# Patient Record
Sex: Male | Born: 1950
Health system: Southern US, Community
[De-identification: ages and names within clinical notes are randomized; demographics above are authoritative.]

## PROBLEM LIST (undated history)

## (undated) ENCOUNTER — Emergency Department (HOSPITAL_COMMUNITY): Admission: EM | Payer: No Typology Code available for payment source | Source: Home / Self Care

## (undated) DIAGNOSIS — C61 Malignant neoplasm of prostate: Secondary | ICD-10-CM

## (undated) DIAGNOSIS — I209 Angina pectoris, unspecified: Secondary | ICD-10-CM

## (undated) DIAGNOSIS — K219 Gastro-esophageal reflux disease without esophagitis: Secondary | ICD-10-CM

## (undated) DIAGNOSIS — E119 Type 2 diabetes mellitus without complications: Secondary | ICD-10-CM

## (undated) DIAGNOSIS — E7800 Pure hypercholesterolemia, unspecified: Secondary | ICD-10-CM

## (undated) DIAGNOSIS — I10 Essential (primary) hypertension: Secondary | ICD-10-CM

## (undated) HISTORY — DX: Type 2 diabetes mellitus without complications: E11.9

## (undated) HISTORY — PX: CARDIAC CATHETERIZATION: SHX172

## (undated) HISTORY — PX: PROSTATE BIOPSY: SHX241

---

## 1999-07-14 ENCOUNTER — Encounter: Payer: Self-pay | Admitting: Emergency Medicine

## 1999-07-14 ENCOUNTER — Emergency Department (HOSPITAL_COMMUNITY): Admission: EM | Admit: 1999-07-14 | Discharge: 1999-07-14 | Payer: Self-pay | Admitting: Emergency Medicine

## 2001-02-23 ENCOUNTER — Inpatient Hospital Stay (HOSPITAL_COMMUNITY): Admission: EM | Admit: 2001-02-23 | Discharge: 2001-02-24 | Payer: Self-pay | Admitting: Emergency Medicine

## 2001-02-23 ENCOUNTER — Encounter: Payer: Self-pay | Admitting: Emergency Medicine

## 2007-11-16 ENCOUNTER — Emergency Department (HOSPITAL_COMMUNITY): Admission: EM | Admit: 2007-11-16 | Discharge: 2007-11-16 | Payer: Self-pay | Admitting: Emergency Medicine

## 2007-11-16 ENCOUNTER — Encounter (INDEPENDENT_AMBULATORY_CARE_PROVIDER_SITE_OTHER): Payer: Self-pay | Admitting: Emergency Medicine

## 2007-11-16 ENCOUNTER — Ambulatory Visit: Payer: Self-pay | Admitting: Surgery

## 2010-11-18 ENCOUNTER — Emergency Department (HOSPITAL_COMMUNITY)
Admission: EM | Admit: 2010-11-18 | Discharge: 2010-11-18 | Payer: Self-pay | Source: Home / Self Care | Admitting: Emergency Medicine

## 2011-05-01 NOTE — Discharge Summary (Signed)
[facility]  Patient:    Theodore Wood, Theodore Wood                     MRN: [id number] Adm. Date:  [id number] Disc. Date: [id number] Attending:  Darlin Priestly Dictator:   Halford Decamp. Delanna Ahmadi, R.N., N.P.                           Discharge Summary  HISTORY OF PRESENT ILLNESS AND HOSPITAL COURSE:  Ms. Vanhorn is a 60 year old male patient with no prior cardiac history.  He came into the emergency room after developing chest pain.  He also had some slurred speech, and the side of his face felt numb.  EMS was called.  He was given nitroglycerin in the emergency room with relief. He was started on Norvasc and aspirin and admitted to rule out MI.  He apparently has a history of polysubstance abuse including alcohol, tobacco, and cocaine.  Last used the night prior to admission.  His enzymes came back negative x 3.  His chest pain was relieved with one nitroglycerin.  He was hypertensive on the new medications.  His blood pressure came down on February 24, 2001.  He was considered stable to be discharged home.  LABORATORY DATA:  WBC 7.3, platelets 218, hematocrit 46, hemoglobin 15.1. Potassium 3.9, sodium 139, chloride 102, BUN 17, creatinine 1.0.  CK-MBs were negative x 3.  Troponin negative x 3.  Total cholesterol was 132, HDL was 49, LDL was 163.  Drug screen was positive for cocaine.  DISCHARGE MEDICATIONS: 1. Enteric-coated aspirin 81 mg once per day. 2. Atenolol 25 mg once per day. 3. Protonix 40 mg once per day x 1 month. 4. Norvasc 5 mg once per day. 5. Vitamin B complex 100 once per day.  ACTIVITY:  As tolerated.  DIET:  He should be on a low salt, low fat diet.  DISCHARGE INSTRUCTIONS:  He should quit smoking, alcohol use, and street drugs.  FOLLOW-UP:  He will follow up with a treadmill Cardiolite on March 25, 2001, at 1:30.  He will also have a 2-D echocardiogram on March 07, 2001, at 3 p.m. He will then follow up with Dr. Jenne Campus on April 01, 2001.  DISCHARGE DIAGNOSES: 1. Chest pain, possibly related to coronary spasm. 2. Hypertension. 3. Polysubstance abuse. DD:  04/20/01 TD:  04/21/01 Job: [id number]

## 2011-09-21 LAB — CBC
HCT: 45.4
Hemoglobin: 15.7
MCHC: 34.7
MCV: 90.8
Platelets: 223
RBC: 4.99
RDW: 14
WBC: 10.6 — ABNORMAL HIGH

## 2011-09-21 LAB — I-STAT 8, (EC8 V) (CONVERTED LAB)
BUN: 11
Chloride: 104
Glucose, Bld: 150 — ABNORMAL HIGH
pCO2, Ven: 30 — ABNORMAL LOW
pH, Ven: 7.511 — ABNORMAL HIGH

## 2012-02-03 ENCOUNTER — Inpatient Hospital Stay (HOSPITAL_COMMUNITY)
Admission: EM | Admit: 2012-02-03 | Discharge: 2012-02-05 | DRG: 092 | Disposition: A | Discharge status: Home / Self Care | Payer: Non-veteran care | Attending: Internal Medicine | Admitting: Internal Medicine

## 2012-02-03 ENCOUNTER — Other Ambulatory Visit: Payer: Self-pay

## 2012-02-03 ENCOUNTER — Encounter (HOSPITAL_COMMUNITY): Payer: Self-pay | Admitting: Emergency Medicine

## 2012-02-03 ENCOUNTER — Emergency Department (HOSPITAL_COMMUNITY): Payer: Non-veteran care

## 2012-02-03 DIAGNOSIS — G722 Myopathy due to other toxic agents: Principal | ICD-10-CM | POA: Diagnosis present

## 2012-02-03 DIAGNOSIS — F1010 Alcohol abuse, uncomplicated: Secondary | ICD-10-CM | POA: Diagnosis present

## 2012-02-03 DIAGNOSIS — Z87891 Personal history of nicotine dependence: Secondary | ICD-10-CM

## 2012-02-03 DIAGNOSIS — F1410 Cocaine abuse, uncomplicated: Secondary | ICD-10-CM | POA: Diagnosis present

## 2012-02-03 DIAGNOSIS — G720 Drug-induced myopathy: Secondary | ICD-10-CM | POA: Diagnosis present

## 2012-02-03 DIAGNOSIS — G479 Sleep disorder, unspecified: Secondary | ICD-10-CM | POA: Diagnosis present

## 2012-02-03 DIAGNOSIS — Z7982 Long term (current) use of aspirin: Secondary | ICD-10-CM

## 2012-02-03 DIAGNOSIS — M7989 Other specified soft tissue disorders: Secondary | ICD-10-CM | POA: Diagnosis present

## 2012-02-03 DIAGNOSIS — M6282 Rhabdomyolysis: Secondary | ICD-10-CM | POA: Diagnosis present

## 2012-02-03 DIAGNOSIS — I10 Essential (primary) hypertension: Secondary | ICD-10-CM | POA: Diagnosis not present

## 2012-02-03 DIAGNOSIS — R079 Chest pain, unspecified: Secondary | ICD-10-CM | POA: Diagnosis present

## 2012-02-03 DIAGNOSIS — T466X5A Adverse effect of antihyperlipidemic and antiarteriosclerotic drugs, initial encounter: Secondary | ICD-10-CM | POA: Diagnosis present

## 2012-02-03 DIAGNOSIS — IMO0001 Reserved for inherently not codable concepts without codable children: Secondary | ICD-10-CM | POA: Diagnosis present

## 2012-02-03 HISTORY — DX: Angina pectoris, unspecified: I20.9

## 2012-02-03 HISTORY — DX: Pure hypercholesterolemia, unspecified: E78.00

## 2012-02-03 HISTORY — DX: Gastro-esophageal reflux disease without esophagitis: K21.9

## 2012-02-03 HISTORY — DX: Essential (primary) hypertension: I10

## 2012-02-03 LAB — DIFFERENTIAL
Eosinophils Relative: 1 % (ref 0–5)
Lymphocytes Relative: 36 % (ref 12–46)
Lymphs Abs: 2.5 10*3/uL (ref 0.7–4.0)
Monocytes Absolute: 1.4 10*3/uL — ABNORMAL HIGH (ref 0.1–1.0)
Neutro Abs: 2.8 10*3/uL (ref 1.7–7.7)

## 2012-02-03 LAB — CBC
HCT: 47.5 % (ref 39.0–52.0)
HCT: 44.2 % (ref 39.0–52.0)
Hemoglobin: 15.7 g/dL (ref 13.0–17.0)
MCH: 31.6 pg (ref 26.0–34.0)
MCV: 89.3 fL (ref 78.0–100.0)
MCV: 88.9 fL (ref 78.0–100.0)
RBC: 5.32 MIL/uL (ref 4.22–5.81)
RBC: 4.97 MIL/uL (ref 4.22–5.81)
WBC: 6.8 10*3/uL (ref 4.0–10.5)

## 2012-02-03 LAB — URINALYSIS, ROUTINE W REFLEX MICROSCOPIC
Ketones, ur: NEGATIVE mg/dL
Leukocytes, UA: NEGATIVE
Nitrite: NEGATIVE
Specific Gravity, Urine: 1.025 (ref 1.005–1.030)
Urobilinogen, UA: 0.2 mg/dL (ref 0.0–1.0)
pH: 6 (ref 5.0–8.0)

## 2012-02-03 LAB — POCT I-STAT, CHEM 8
BUN: 11 mg/dL (ref 6–23)
Calcium, Ion: 1.22 mmol/L (ref 1.12–1.32)
Chloride: 99 mEq/L (ref 96–112)
HCT: 51 % (ref 39.0–52.0)
Potassium: 4.1 mEq/L (ref 3.5–5.1)

## 2012-02-03 LAB — CARDIAC PANEL(CRET KIN+CKTOT+MB+TROPI)
CK, MB: 5.9 ng/mL — ABNORMAL HIGH (ref 0.3–4.0)
Relative Index: 0.6 (ref 0.0–2.5)
Total CK: 1198 U/L — ABNORMAL HIGH (ref 7–232)
Troponin I: <0.3 ng/mL (ref <0.30)

## 2012-02-03 LAB — RAPID URINE DRUG SCREEN, HOSP PERFORMED: Benzodiazepines: NOT DETECTED

## 2012-02-03 LAB — VITAMIN B12: Vitamin B-12: 737 pg/mL (ref 211–911)

## 2012-02-03 LAB — HEPATIC FUNCTION PANEL
Albumin: 3.8 g/dL (ref 3.5–5.2)
Alkaline Phosphatase: 94 U/L (ref 39–117)
Total Protein: 7.4 g/dL (ref 6.0–8.3)

## 2012-02-03 LAB — PHOSPHORUS: Phosphorus: 3.3 mg/dL (ref 2.3–4.6)

## 2012-02-03 LAB — CALCIUM: Calcium: 9.7 mg/dL (ref 8.4–10.5)

## 2012-02-03 LAB — TSH: TSH: 3.128 u[IU]/mL (ref 0.350–4.500)

## 2012-02-03 MED ORDER — FOLIC ACID 1 MG PO TABS
1.0000 mg | ORAL_TABLET | Freq: Two times a day (BID) | ORAL | Status: DC
Start: 2012-02-03 — End: 2012-02-05
  Administered 2012-02-03 – 2012-02-05 (×4): 1 mg via ORAL
  Filled 2012-02-03 (×5): qty 1

## 2012-02-03 MED ORDER — THIAMINE HCL 100 MG PO TABS: 100.0000 mg | ORAL_TABLET | Freq: Every day | ORAL | Status: DC

## 2012-02-03 MED ORDER — LORAZEPAM 2 MG/ML IJ SOLN: 0.0000 mg | Freq: Four times a day (QID) | INTRAMUSCULAR | Status: AC

## 2012-02-03 MED ORDER — SODIUM CHLORIDE 0.9 % IV SOLN
INTRAVENOUS | Status: DC
  Administered 2012-02-03: 08:00:00 via INTRAVENOUS

## 2012-02-03 MED ORDER — INFLUENZA VIRUS VACC SPLIT PF IM SUSP
0.5000 mL | INTRAMUSCULAR | Status: AC
  Administered 2012-02-04: 0.5 mL via INTRAMUSCULAR
  Filled 2012-02-03: qty 0.5

## 2012-02-03 MED ORDER — DOCUSATE SODIUM 100 MG PO CAPS
100.0000 mg | ORAL_CAPSULE | Freq: Two times a day (BID) | ORAL | Status: DC | PRN
  Filled 2012-02-03: qty 1

## 2012-02-03 MED ORDER — ASPIRIN EC 325 MG PO TBEC
325.0000 mg | DELAYED_RELEASE_TABLET | Freq: Every day | ORAL | Status: DC
  Administered 2012-02-03 – 2012-02-05 (×3): 325 mg via ORAL
  Filled 2012-02-03 (×3): qty 1

## 2012-02-03 MED ORDER — LORAZEPAM 2 MG/ML IJ SOLN: 1.0000 mg | Freq: Four times a day (QID) | INTRAMUSCULAR | Status: DC | PRN

## 2012-02-03 MED ORDER — FLUNISOLIDE 25 MCG/ACT (0.025%) NA SOLN
2.0000 | Freq: Two times a day (BID) | NASAL | Status: DC
  Filled 2012-02-03: qty 25

## 2012-02-03 MED ORDER — MORPHINE SULFATE 2 MG/ML IJ SOLN
0.5000 mg | INTRAMUSCULAR | Status: DC | PRN
  Administered 2012-02-05: 0.5 mg via INTRAVENOUS
  Filled 2012-02-03: qty 1

## 2012-02-03 MED ORDER — PANTOPRAZOLE SODIUM 40 MG PO TBEC
40.0000 mg | DELAYED_RELEASE_TABLET | Freq: Every day | ORAL | Status: DC
  Administered 2012-02-03 – 2012-02-05 (×3): 40 mg via ORAL
  Filled 2012-02-03 (×3): qty 1

## 2012-02-03 MED ORDER — SODIUM CHLORIDE 0.9 % IJ SOLN
3.0000 mL | Freq: Two times a day (BID) | INTRAMUSCULAR | Status: DC
  Administered 2012-02-03 – 2012-02-04 (×3): 3 mL via INTRAVENOUS

## 2012-02-03 MED ORDER — SODIUM CHLORIDE 0.9 % IV SOLN
INTRAVENOUS | Status: DC
  Administered 2012-02-03 – 2012-02-05 (×4): via INTRAVENOUS

## 2012-02-03 MED ORDER — ADULT MULTIVITAMIN W/MINERALS CH
1.0000 | ORAL_TABLET | Freq: Every day | ORAL | Status: DC
  Administered 2012-02-03 – 2012-02-05 (×3): 1 via ORAL
  Filled 2012-02-03 (×3): qty 1

## 2012-02-03 MED ORDER — NITROGLYCERIN 0.4 MG SL SUBL: 0.4000 mg | SUBLINGUAL_TABLET | SUBLINGUAL | Status: DC | PRN

## 2012-02-03 MED ORDER — LORAZEPAM 1 MG PO TABS: 1.0000 mg | ORAL_TABLET | Freq: Four times a day (QID) | ORAL | Status: DC | PRN

## 2012-02-03 MED ORDER — FLUTICASONE PROPIONATE 50 MCG/ACT NA SUSP
2.0000 | Freq: Every day | NASAL | Status: DC
  Administered 2012-02-04 – 2012-02-05 (×2): 2 via NASAL
  Filled 2012-02-03 (×2): qty 16

## 2012-02-03 MED ORDER — OMEGA-3 FATTY ACIDS 1000 MG PO CAPS: 1.0000 g | ORAL_CAPSULE | Freq: Two times a day (BID) | ORAL | Status: DC

## 2012-02-03 MED ORDER — HYDROCHLOROTHIAZIDE 25 MG PO TABS
25.0000 mg | ORAL_TABLET | Freq: Every day | ORAL | Status: DC
  Administered 2012-02-04 – 2012-02-05 (×2): 25 mg via ORAL
  Filled 2012-02-03 (×3): qty 1

## 2012-02-03 MED ORDER — LORAZEPAM 2 MG/ML IJ SOLN: 0.0000 mg | Freq: Two times a day (BID) | INTRAMUSCULAR | Status: DC

## 2012-02-03 MED ORDER — ZOLPIDEM TARTRATE 5 MG PO TABS
5.0000 mg | ORAL_TABLET | Freq: Once | ORAL | Status: AC
  Administered 2012-02-03: 5 mg via ORAL
  Filled 2012-02-03: qty 1

## 2012-02-03 MED ORDER — VITAMIN B-1 100 MG PO TABS
100.0000 mg | ORAL_TABLET | Freq: Every day | ORAL | Status: DC
  Administered 2012-02-03 – 2012-02-05 (×3): 100 mg via ORAL
  Filled 2012-02-03 (×3): qty 1

## 2012-02-03 MED ORDER — HEPARIN SODIUM (PORCINE) 5000 UNIT/ML IJ SOLN
5000.0000 [IU] | Freq: Three times a day (TID) | INTRAMUSCULAR | Status: DC
  Administered 2012-02-03 – 2012-02-05 (×5): 5000 [IU] via SUBCUTANEOUS
  Filled 2012-02-03 (×9): qty 1

## 2012-02-03 NOTE — ED Notes (Signed)
Returned from [employer]. Vss. Pt placed back on cardiac monitor.

## 2012-02-03 NOTE — H&P (Signed)
Internal Medicine Teaching Service  PCP: [facility] hospital (Dr.  Benard Halsted) Attending: Dr. Josem Kaufmann  First Contact: Dr. Bosie Clos [phone] Second Contact: Dr. Tonny Branch PGY 2 # [phone]  Subjective:   Patient ID: Theodore Wood male   DOB: 07-24-1951 61 y.o.   MRN: [id number]  CHIEF COMPLAINT: "body aches all over for 1 week."  HISTORY OF PRESENT ILLNESS: Mr.Theodore Wood is a 61 y.o.  AA man and [facility];  with a history of HTN, HLD and ETOH and cocaine use, presents to [facility] ED with left sided CP and "body aches all over" for at least one week duration. Patient states that the CP is left sided, sharp 8/10 in intensity, intermittent, radiates up to his right neck, and started while being asleep in bed. He reports mild nausea, SOB and mild sweats but no orthopnea, PND, vomiting, fever, chills, cough, pleurisy, HA, black outs, blurry vision, sore throat, abdominal pain, black/blody stools, hematuria, anuria, oliguria or dysuria. Patient denies any recent travel, trauma. Last  Alcohol drink yesterday on 02/02/12; last crack cocaine use 01/31/12.  Patient states that the CP and body aches are worse with movement and not relieved with "BC's or aspirin" (last does 02/01/12). States that has had similar CP 7 years ago for which he had a full work up including cath angio in [facility] "and was normal." However, I was unable to locate the records. Off note, the patient had a recent OV with his PCP at [facility] hospital on 01/28/12 for the same symptoms. At that time, he was prescribed new medications, including atorvastatin, omeprazole, thiamine and folate.    ER Tx given - NS IVF at 125 cc/hr   Past Medical History  Diagnosis Date  . Hypertension   . Chest pain    Current Facility-Administered Medications  Medication Dose Route Frequency Provider Last Rate Last Dose  . 0.9 %  sodium chloride infusion   Intravenous Continuous Juliet Rude. Rubin Payor, MD 125 mL/hr at 02/03/12 0800     Current Outpatient  Prescriptions  Medication Sig Dispense Refill  . atorvastatin (LIPITOR) 40 MG tablet Take 20 mg by mouth at bedtime.      . fish oil-omega-3 fatty acids 1000 MG capsule Take 1 g by mouth 2 (two) times daily.      . flunisolide (NASALIDE) 0.025 % SOLN Inhale 2 sprays into the lungs 2 (two) times daily.      . folic acid (FOLVITE) 1 MG tablet Take 1 mg by mouth 2 (two) times daily.      . hydrochlorothiazide (HYDRODIURIL) 25 MG tablet Take 25 mg by mouth daily.      Marland Kitchen omeprazole (PRILOSEC) 20 MG capsule Take 20 mg by mouth daily.      Marland Kitchen thiamine 100 MG tablet Take 200 mg by mouth 2 (two) times daily.       History reviewed. No pertinent family history. History   Social History  . Marital Status: Widowed    Spouse Name: N/A    Number of Children: 4 children live in [place] and [place]  . Years of Education: High school   Social History Main Topics  . Smoking status: Former Smoker    Quit date: 02/02/2002  . Smokeless tobacco: None  . Alcohol Use: Yes     6pack beer/day  . Drug Use: Yes    Special: Cocaine  . Sexually Active: Yes   Other Topics Concern  . None   Social History Narrative  . None  Review of Systems: Constitutional: Denies fever, chills, diaphoresis, appetite change and fatigue.  HEENT: Denies photophobia, eye pain, redness, hearing loss, ear pain, congestion, sore throat, rhinorrhea, sneezing, mouth sores, trouble swallowing, neck pain, neck stiffness and tinnitus.   Respiratory: endorses mild SOB. Denies DOE, cough, chest tightness, and wheezing.   Cardiovascular: Denies chest pain, palpitations ; endorses "occasional" leg swelling.  Gastrointestinal: Endorses nausea, denis vomiting, abdominal pain, diarrhea, constipation, blood in stool and abdominal distention.  Genitourinary: Denies dysuria, urgency, frequency, hematuria, flank pain and difficulty urinating.  Musculoskeletal: Reports myalgias and lower back pain,  Right hand 3rd digit  ip joint joint  swellingand pain; denies gait problem.  Skin: Denies pallor, rash and wound.  Neurological: reports slight dizziness, denies seizures, syncope, weakness, light-headedness, numbness and headaches.  Hematological: Denies adenopathy. Easy bruising, personal or family bleeding history  Psychiatric/Behavioral: Denies suicidal ideation, mood changes, confusion, nervousness, sleep disturbance and agitation  Objective:  Physical Exam: Filed Vitals:   02/03/12 0721  BP: 152/100  Pulse: 83  Temp: 98.4 F (36.9 C)  TempSrc: Oral  Resp: 22  SpO2: 99%   Constitutional: Vital signs reviewed.  Patient is a well-developed and well-nourished, in no acute distress and cooperative with exam. Alert and oriented x3.  Head: Normocephalic and atraumatic Ear: TM normal bilaterally Mouth: no erythema or exudates, dry oral mucosa; poor dentition Eyes: Arcus senilis bilaterally,  PERRL, EOMI, conjunctivae normal, No scleral icterus.  Neck: Supple, Trachea midline normal ROM, No JVD, mass, thyromegaly, or carotid bruit present.  Cardiovascular: RRR, S1 normal, S2 normal, no MRG, pulses symmetric and intact bilaterally Pulmonary/Chest: chest wall TTP bilaterally; CTAB, no wheezes, rales, or rhonchi Abdominal: Soft. Non-tender, non-distended, bowel sounds are normal, no masses, organomegaly, or guarding present.  GU: no CVA tenderness Musculoskeletal: No joint deformities, erythema, or stiffness, ROM full; right hand 3rd digit proximal phalangeus TTP noted Hematology: no cervical, inginal, or axillary adenopathy.  Neurological: A&O x3, Strenght is normal and symmetric bilaterally, cranial nerve II-XII are grossly intact, no focal motor deficit, sensory intact to light touch bilaterally. No asterixis. Skin: Warm, dry and intact. No rash, cyanosis, or clubbing. No track marks. Psychiatric: Normal mood and affect. speech and behavior is normal. Judgment and thought content normal. Cognition and memory are normal.    Basic Metabolic Panel:  Basename 02/03/12 0837  NA 139  K 4.1  CL 99  CO2 --  GLUCOSE 102*  BUN 11  CREATININE 0.90  CALCIUM --  MG --  PHOS --   Liver Function Tests: pending  CBC:  Basename 02/03/12 0837 02/03/12 0745  WBC -- 6.8  NEUTROABS -- 2.8  HGB 17.3* 16.9  HCT 51.0 47.5  MCV -- 89.3  PLT -- 237   Cardiac Enzymes:  Basename 02/03/12 0745  CKTOTAL 1198*  CKMB 7.6*  CKMBINDEX --  TROPONINI <0.30   Urine Drug Screen: Drugs of Abuse     Component Value Date/Time   LABOPIA NONE DETECTED 02/03/2012 0752   COCAINSCRNUR POSITIVE* 02/03/2012 0752   LABBENZ NONE DETECTED 02/03/2012 0752   AMPHETMU NONE DETECTED 02/03/2012 0752   THCU NONE DETECTED 02/03/2012 0752   LABBARB NONE DETECTED 02/03/2012 0752    Alcohol Level: No results found for this basename: ETH:2 in the last 72 hours Urinalysis:  Basename 02/03/12 0752  COLORURINE YELLOW  LABSPEC 1.025  PHURINE 6.0  GLUCOSEU NEGATIVE  HGBUR NEGATIVE  BILIRUBINUR NEGATIVE  KETONESUR NEGATIVE  PROTEINUR NEGATIVE  UROBILINOGEN 0.2  NITRITE NEGATIVE  LEUKOCYTESUR NEGATIVE  Misc. Labs: Amylase: pending TSH: pending HGB A1C: pending EKG: reviewed   Assessment & Plan:   ASSESSMENT: [name] is a 61 y/o AA man with multiple risk factors for CAD including age, gender, HTN, HLD, cocaine use and FMH of DM and CAD.  1. He presents with left-sided CP and myalgias.  Differential list of Dx includes but not limited to: *CAD -TIMI score  3 (risk of 13%) vs *Muskuloskeletal CP - myofascial strain/rhabdomylisis vs costochondritis vs *GERD vs **PE is ujlikely-->Modified [place] score:3 which indicates low probablity (8%)  - telemetry x 48 hrs - cardiac enzymes x 2 more q 6 hr - repeat EKG in AM - ASA - No beta blockers due to Cocaine + UDS - No statin due to rhabdomyolysis - O2 by [place] to keep SpO2 greater than 92th % - CBC, BMP in AM - Fasting lipids, HgbA1C, TSH, Amylase - NTG SL PRN - Morphine 0.5  mg IV q 2-4 hr PRN chest pain with hold parameters - Tylenol PRN if LFT's normal - continue with PPI for GERD - 2D Echo  2. Myalgias  In a setting elevated CK. Likely due ot Rhabdomyolysis. Patient is a poor historian and it is difficult to time the onset of his symptoms and it is unclear whether the patient was already symptomatic prior to the start of a therapy with Atorvastatin as of 01/28/12. Patient denies any crush injuries or prolonged immobility, strenuous exercise or seizure activity. A rheumatology work up  May be considered but  polymyalgia rheumatica and/or polymyositis seem to be unlikely given the patient's mono arthralgia of a small joint. In  Addition, patient does not seem to be volume contracted ->no ortho stasis, elevated BP's; and does not have fevers->low index of suspicion for a viral infection.  -d/C statin -NS IVF at 125 cc/hr -I/O's -monitor renal function with daily B-mets -check TSH,  Mg, Phos levels.  3. HTN -continue with HCTZ home dose -no beta blockers per above -Heart Healthy diet  4. ETOH abuse -patient admits to being CAGE positive but no hx of DT's. -Seizure precatuions, fall precautions -CIWA protocol with scheduled Ativan -Folate,Thiamine and MVI daily -check vit B12, Thiamine levels -counseling  5. PPx: - heparin 5000 Units SQ  q8 hr  6. Medical records from [facility] hospital requested.

## 2012-02-03 NOTE — ED Notes (Addendum)
Pt c/o of sharp left sided chest pain for the last week. Pt reports pain is worse at night and he cant sleep. Pt denies nausea/vomiting with pain. Reports SOB with pain. Pt reports last cocaine use 3 days ago.

## 2012-02-03 NOTE — ED Notes (Signed)
Dennie Bible, RN covering for [employer], Charity fundraiser for lunch break.

## 2012-02-03 NOTE — ED Provider Notes (Signed)
History     CSN: [id number]  Arrival date & time 02/03/12  0717   First MD Initiated Contact with Patient 02/03/12 [phone]      Chief Complaint  Patient presents with  . Chest Pain    (Consider location/radiation/quality/duration/timing/severity/associated sxs/prior treatment) Patient is a 61 y.o. male presenting with chest pain. The history is provided by the patient.  Chest Pain Primary symptoms include shortness of breath. Pertinent negatives for primary symptoms include no abdominal pain, no nausea and no vomiting.  Pertinent negatives for associated symptoms include no numbness and no weakness.    patient states she's had a week of left-sided chest pain. Comes and goes. It is worse at night. No nausea vomiting. He's had some shortness of breath with the pain. He states he also gets muscle cramps over various parts of his body. No fevers. The pain does not come on with exertion. He last used cocaine 3 days ago. He states he is a former smoker. He states he's been eating and drinking okay. No coughing. He cannot make the pain go away.  Past Medical History  Diagnosis Date  . Hypertension     No past surgical history on file.  No family history on file.  History  Substance Use Topics  . Smoking status: Former Games developer  . Smokeless tobacco: Not on file  . Alcohol Use: Yes     6pack beer/day      Review of Systems  Constitutional: Negative for activity change and appetite change.  HENT: Negative for neck stiffness.   Eyes: Negative for pain.  Respiratory: Positive for shortness of breath. Negative for chest tightness.   Cardiovascular: Positive for chest pain. Negative for leg swelling.  Gastrointestinal: Negative for nausea, vomiting, abdominal pain and diarrhea.  Genitourinary: Negative for flank pain.  Musculoskeletal: Negative for back pain.       Muscle cramps  Skin: Negative for rash.  Neurological: Negative for weakness, numbness and headaches.    Psychiatric/Behavioral: Negative for behavioral problems.    Allergies  Review of patient's allergies indicates no known allergies.  Home Medications   Current Outpatient Rx  Name Route Sig Dispense Refill  . ATORVASTATIN CALCIUM 40 MG PO TABS Oral Take 20 mg by mouth at bedtime.    . OMEGA-3 FATTY ACIDS 1000 MG PO CAPS Oral Take 1 g by mouth 2 (two) times daily.    Marland Kitchen FLUNISOLIDE 0.025 % NA SOLN Inhalation Inhale 2 sprays into the lungs 2 (two) times daily.    Marland Kitchen FOLIC ACID 1 MG PO TABS Oral Take 1 mg by mouth 2 (two) times daily.    Marland Kitchen HYDROCHLOROTHIAZIDE 25 MG PO TABS Oral Take 25 mg by mouth daily.    Marland Kitchen OMEPRAZOLE 20 MG PO CPDR Oral Take 20 mg by mouth daily.    . THIAMINE HCL 100 MG PO TABS Oral Take 200 mg by mouth 2 (two) times daily.      BP 152/100  Pulse 83  Temp(Src) 98.4 F (36.9 C) (Oral)  Resp 22  SpO2 99%  Physical Exam  Nursing note and vitals reviewed. Constitutional: He is oriented to person, place, and time. He appears well-developed and well-nourished.  HENT:  Head: Normocephalic and atraumatic.  Eyes: EOM are normal. Pupils are equal, round, and reactive to light.  Neck: Normal range of motion. Neck supple.  Cardiovascular: Normal rate, regular rhythm and normal heart sounds.   No murmur heard. Pulmonary/Chest: Effort normal and breath sounds normal.  Abdominal: Soft. Bowel  sounds are normal. He exhibits no distension and no mass. There is no tenderness. There is no rebound and no guarding.  Musculoskeletal: Normal range of motion. He exhibits no edema.  Neurological: He is alert and oriented to person, place, and time. No cranial nerve deficit.  Skin: Skin is warm and dry.  Psychiatric: He has a normal mood and affect.    ED Course  Procedures (including critical care time)  Labs Reviewed  DIFFERENTIAL - Abnormal; Notable for the following:    Neutrophils Relative 41 (*)    Monocytes Relative 21 (*)    Monocytes Absolute 1.4 (*)    All other  components within normal limits  CARDIAC PANEL(CRET KIN+CKTOT+MB+TROPI) - Abnormal; Notable for the following:    Total CK 1198 (*)    CK, MB 7.6 (*)    All other components within normal limits  URINE RAPID DRUG SCREEN (HOSP PERFORMED) - Abnormal; Notable for the following:    Cocaine POSITIVE (*)    All other components within normal limits  POCT I-STAT, CHEM 8 - Abnormal; Notable for the following:    Glucose, Bld 102 (*)    Hemoglobin 17.3 (*)    All other components within normal limits  CBC  URINALYSIS, ROUTINE W REFLEX MICROSCOPIC  DRUG SCREEN PANEL (SERUM)  DRUG SCREEN PANEL (SERUM)   Dg Chest 2 View  02/03/2012  *RADIOLOGY REPORT*  Clinical Data: 61 year old male with chest pain, chills, shortness of breath.  CHEST - 2 VIEW  Comparison: None.  Findings: Lung volumes are within normal limits. Normal cardiac size and mediastinal contours.  Visualized tracheal air column is within normal limits.  No pneumothorax, pulmonary edema, pleural effusion or confluent pulmonary opacity. No acute osseous abnormality identified.  IMPRESSION: No acute cardiopulmonary abnormality.  Original Report Authenticated By: Ulla Potash III, M.D.     1. Chest pain   2. Rhabdomyolysis   3. Cocaine abuse      Date: 02/03/2012  Rate: 85  Rhythm: normal sinus rhythm  QRS Axis: normal  Intervals: normal  ST/T Wave abnormalities: nonspecific ST/T changes  Conduction Disutrbances:none  Narrative Interpretation:   Old EKG Reviewed: changes noted    MDM  Chest pain and muscle aches. She's cocaine 3 days ago. Patient has elevated total CK but negative index. EKG has nonspecific changes. Patient states that he had a heart catheterization back on several years ago, not able to find her evidence of this. He'll be admitted to outpatient clinic for further workup        Juliet Rude. Rubin Payor, MD 02/03/12 [phone]

## 2012-02-03 NOTE — ED Notes (Signed)
Internal Medicine MD at bedside

## 2012-02-04 ENCOUNTER — Other Ambulatory Visit: Payer: Self-pay

## 2012-02-04 DIAGNOSIS — G720 Drug-induced myopathy: Secondary | ICD-10-CM | POA: Diagnosis present

## 2012-02-04 DIAGNOSIS — M6282 Rhabdomyolysis: Secondary | ICD-10-CM

## 2012-02-04 DIAGNOSIS — T466X5A Adverse effect of antihyperlipidemic and antiarteriosclerotic drugs, initial encounter: Secondary | ICD-10-CM | POA: Diagnosis present

## 2012-02-04 LAB — BASIC METABOLIC PANEL
BUN: 11 mg/dL (ref 6–23)
CO2: 25 mEq/L (ref 19–32)
CO2: 25 mEq/L (ref 19–32)
Calcium: 9.2 mg/dL (ref 8.4–10.5)
Calcium: 9 mg/dL (ref 8.4–10.5)
Chloride: 104 mEq/L (ref 96–112)
Creatinine, Ser: 0.98 mg/dL (ref 0.50–1.35)
Creatinine, Ser: 0.93 mg/dL (ref 0.50–1.35)
GFR calc non Af Amer: >90 mL/min (ref >90)
Glucose, Bld: 104 mg/dL — ABNORMAL HIGH (ref 70–99)

## 2012-02-04 LAB — CARDIAC PANEL(CRET KIN+CKTOT+MB+TROPI)
CK, MB: 5 ng/mL — ABNORMAL HIGH (ref 0.3–4.0)
CK, MB: 4.6 ng/mL — ABNORMAL HIGH (ref 0.3–4.0)
Relative Index: 0.3 (ref 0.0–2.5)
Relative Index: 0.3 (ref 0.0–2.5)
Total CK: 1965 U/L — ABNORMAL HIGH (ref 7–232)
Total CK: 1802 U/L — ABNORMAL HIGH (ref 7–232)
Troponin I: <0.3 ng/mL (ref <0.30)
Troponin I: <0.3 ng/mL (ref <0.30)

## 2012-02-04 LAB — LIPID PANEL
Cholesterol: 159 mg/dL (ref 0–200)
Triglycerides: 289 mg/dL — ABNORMAL HIGH (ref <150)
VLDL: 58 mg/dL — ABNORMAL HIGH (ref 0–40)

## 2012-02-04 LAB — HEMOGLOBIN A1C: Hgb A1c MFr Bld: 5.6 % (ref <5.7)

## 2012-02-04 LAB — FOLATE RBC: RBC Folate: 950 ng/mL — ABNORMAL HIGH (ref >=366)

## 2012-02-04 LAB — CK: Total CK: 1696 U/L — ABNORMAL HIGH (ref 7–232)

## 2012-02-04 MED ORDER — TRAMADOL HCL 50 MG PO TABS
50.0000 mg | ORAL_TABLET | Freq: Four times a day (QID) | ORAL | Status: DC | PRN
  Administered 2012-02-04 – 2012-02-05 (×3): 50 mg via ORAL
  Filled 2012-02-04 (×3): qty 1

## 2012-02-04 MED ORDER — ZOLPIDEM TARTRATE 5 MG PO TABS
10.0000 mg | ORAL_TABLET | Freq: Every evening | ORAL | Status: DC | PRN
  Administered 2012-02-04: 10 mg via ORAL
  Filled 2012-02-04: qty 2

## 2012-02-04 NOTE — Progress Notes (Signed)
Subjective: Theodore Wood in sitting in bed comfortably. He says that his myalgias are "much better" since yesterday and he is urinating frequently. He did have some difficulty sleeping last night due to noise, but otherwise feels well rested. He says that his L sided CP has remitted and he denies any SOB or lightheadedness. He has a good appetite and was eating breakfast at bedside. He does complain of increased R hand swelling over the past day. He says that he first noticed it in his R 3rd knuckle but know his hand is diffusely swollen and tight and he has difficulty with wrist extension.    Objective: Vital signs in last 24 hours: Filed Vitals:   02/03/12 1930 02/03/12 2100 02/04/12 0500 02/04/12 0900  BP:  130/79 155/97 147/98  Pulse:  82 89 99  Temp:  98.4 F (36.9 C) 98.2 F (36.8 C) 98.6 F (37 C)  TempSrc:  Oral Oral Oral  Resp:  20 18 20   Height: 5\' 6"  (1.676 m)     Weight: 75.751 kg (167 lb)     SpO2:  95% 93% 99%   Weight change:   Intake/Output Summary (Last 24 hours) at 02/04/12 1124 Last data filed at 02/04/12 0900  Gross per 24 hour  Intake 536.25 ml  Output    350 ml  Net 186.25 ml   Physical Exam: General: resting in bed HEENT: PERRL, EOMI, no scleral icterus Cardiac: RRR, no rubs, murmurs or gallops Pulm: clear to auscultation bilaterally, moving normal volumes of air Abd: soft, nontender, nondistended, BS present Ext: R hand is moderately swollen from wrist through all digits. Skin is taught and warm. Radial pulse is 2+, IV inserted in opposite forearm vein. Poor wrist extension and grip strength 2/2 pain. Hand and wrist diffusely tender to palpation.  Neuro: alert and oriented X3, cranial nerves II-XII grossly intact   Lab Results: Basic Metabolic Panel:  Lab 02/04/12 4854 02/03/12 1542 02/03/12 0837  NA 137 -- 139  K 3.8 -- 4.1  CL 104 -- 99  CO2 25 -- --  GLUCOSE 104* -- 102*  BUN 11 -- 11  CREATININE 0.98 0.89 --  CALCIUM 9.2 9.7 --  MG -- 2.0  --  PHOS -- 3.3 --   Liver Function Tests:  Lab 02/03/12 1542  AST 56*  ALT 44  ALKPHOS 94  BILITOT 0.4  PROT 7.4  ALBUMIN 3.8    Lab 02/03/12 1542  LIPASE --  AMYLASE 74   CBC:  Lab 02/03/12 1542 02/03/12 0837 02/03/12 0745  WBC 6.9 -- 6.8  NEUTROABS -- -- 2.8  HGB 15.7 17.3* --  HCT 44.2 51.0 --  MCV 88.9 -- 89.3  PLT 221 -- 237   Cardiac Enzymes:  Lab 02/04/12 0909 02/04/12 0655 02/03/12 2314 02/03/12 1535  CKTOTAL 1857* 1802* 1965* --  CKMB -- 4.6* 5.0* 5.9*  CKMBINDEX -- -- -- --  TROPONINI -- <0.30 <0.30 <0.30   Hemoglobin A1C:  Lab 02/03/12 1542  HGBA1C 5.6   Fasting Lipid Panel:  Lab 02/04/12 0655  CHOL 159  HDL 34*  LDLCALC 67  TRIG 627*  CHOLHDL 4.7  LDLDIRECT --   Thyroid Function Tests:  Lab 02/03/12 1542  TSH 3.128  T4TOTAL --  FREET4 --  T3FREE --  THYROIDAB --   Coagulation:  Lab 02/04/12 0655  LABPROT 13.4  INR 1.00   Anemia Panel:  Lab 02/03/12 1542  VITAMINB12 737  FOLATE --  FERRITIN --  TIBC --  IRON --  RETICCTPCT --   Urine Drug Screen: Drugs of Abuse     Component Value Date/Time   LABOPIA NONE DETECTED February 21, 2012 0752   COCAINSCRNUR POSITIVE* 2012/02/21 0752   LABBENZ NONE DETECTED 02/21/12 0752   AMPHETMU NONE DETECTED 02/21/12 0752   THCU NONE DETECTED Feb 21, 2012 0752   LABBARB NONE DETECTED February 21, 2012 0752   Urinalysis:  Lab February 21, 2012 0752  COLORURINE YELLOW  LABSPEC 1.025  PHURINE 6.0  GLUCOSEU NEGATIVE  HGBUR NEGATIVE  BILIRUBINUR NEGATIVE  KETONESUR NEGATIVE  PROTEINUR NEGATIVE  UROBILINOGEN 0.2  NITRITE NEGATIVE  LEUKOCYTESUR NEGATIVE    Studies/Results: Dg Chest 2 View Feb 21, 2012  IMPRESSION: No acute cardiopulmonary abnormality.    Medications: I have reviewed the patient's current medications. Scheduled Meds:   . aspirin EC  325 mg Oral Daily  . fluticasone  2 spray Each Nare Daily  . folic acid  1 mg Oral BID  . heparin  5,000 Units Subcutaneous Q8H  . hydrochlorothiazide   25 mg Oral Daily  . influenza  inactive virus vaccine  0.5 mL Intramuscular Tomorrow-1000  . LORazepam  0-4 mg Intravenous Q6H   Followed by  . LORazepam  0-4 mg Intravenous Q12H  . mulitivitamin with minerals  1 tablet Oral Daily  . pantoprazole  40 mg Oral Q1200  . sodium chloride  3 mL Intravenous Q12H  . thiamine  100 mg Oral Daily  . zolpidem  5 mg Oral Once  . DISCONTD: fish oil-omega-3 fatty acids  1 g Oral BID  . DISCONTD: flunisolide  2 spray Nasal BID  . DISCONTD: thiamine  100 mg Oral Daily   Continuous Infusions:   . sodium chloride 200 mL/hr at 02/04/12 0829  . DISCONTD: sodium chloride 125 mL/hr at 02/21/2012 0800   PRN Meds:.docusate sodium, LORazepam, LORazepam, morphine, nitroGLYCERIN  Assessment/Plan: ASSESSMENT:  Mr. Theodore Wood is a 61 y/o AA man with multiple risk factors for CAD including age, gender, HTN, HLD, cocaine use and FMH of DM and CAD p/w myalgias and elevated CK.  1. Chest pain Mr. Quebedeaux presents with a week of left-sided chest pain that is worse with left arm abduction and is reproducible on exam. His pattern of pain, reproducibility, concurrent history of myalgias, and elevated CK is reassuring for noncardiac origin of chest pain. Additionally, cycled troponins have remained negative and EKG does not show any ST segment changes to suggest ischemia.  Chest pain is no longer bothering him this morning. His symptoms are most likely 2/2 myalgias from rhabdomyolysis. - Cancel am echo - Continue hydration for rhabdo as below  2. Rhabdomyolysis  Patient's onset of myalgias is consistent with initiation of atorvastaton on 01/28/12. He has denied any seizures, crush injuries, prolonged immobility, or strenuous exercise. TSH is also wnl. Patient's CKs are trending 1198--> 1658 -->1965-->1802-->1857. We increased his fluids from 125 cc/hr to 200cc/hr to facilitate diuresis. His renal function has remained normal with Cr of 0.98 and Mg, Phos and Ca also within normal  limits. Suspect rhabdomyolysis 2/2 statin myopathy. TChol 159 on admission w LDL 67. - Have d/c atorvastatin. LDL is 67 on admission.Pt could stand trial of different statin on discharge, however LDL is at goal now. - NS 200cc/hr. Will consider adding loop diuretic if pt demonstrates signs of volume overload. - Strict I&Os - B met and CK q 12 hr  3. R hand painful swelling Patient endorses swelling over one day. He has good perfusion but skin now taught, painful to touch and he has  limited ROM. Has IV w L venous access.  - Will monitor carefully to make sure pt not having compartment syndrome.      3. HTN  -continue with HCTZ home dose  -no beta blockers w cocaine history -Heart Healthy diet   4. ETOH abuse  Mr. Popowski acknowledges excessive EtOH use. He denies any history of DTs/ withdrawal sx.  -Seizure precatuions, fall precautions  -CIWA protocol with scheduled Ativan  -Folate,Thiamine and MVI daily  - B12 is 737  5. PPx:  - heparin 5000 Units SQ q8 hr       LOS: 1 day   Bronson Curb 02/04/2012, 11:24 AM  Agree with the above.  Please see my H&P for the details of my assessment and plan today.

## 2012-02-04 NOTE — Discharge Summary (Signed)
Internal Medicine Teaching [facility] Discharge Note  Name: Theodore Wood MRN: [id number] DOB: 20-Oct-1951 61 y.o.  Date of Admission: 02/03/2012  7:17 AM Date of Discharge: 02/05/2012 Attending Physician: Rocco Serene, MD  Discharge Diagnosis: Principal Problem:  *Rhabdomyolysis Active Problems:  Chest pain  HTN (hypertension)  Alcohol abuse  Cocaine abuse  Statin myopathy  Discharge Medications: Medication List  As of 02/05/2012  1:23 PM   STOP taking these medications         atorvastatin 40 MG tablet         TAKE these medications         aspirin 81 MG EC tablet   Take 1 tablet (81 mg total) by mouth daily. Swallow whole.      fish oil-omega-3 fatty acids 1000 MG capsule   Take 1 g by mouth 2 (two) times daily.      flunisolide 0.025 % Soln   Commonly known as: NASALIDE   Place 1 spray into the nose daily.      folic acid 1 MG tablet   Commonly known as: FOLVITE   Take 1 mg by mouth 2 (two) times daily.      hydrochlorothiazide 25 MG tablet   Commonly known as: HYDRODIURIL   Take 25 mg by mouth daily.      omeprazole 20 MG capsule   Commonly known as: PRILOSEC   Take 20 mg by mouth daily.      thiamine 100 MG tablet   Take 200 mg by mouth 2 (two) times daily.      traMADol 50 MG tablet   Commonly known as: ULTRAM   Take 1 tablet (50 mg total) by mouth every 6 (six) hours as needed for pain.           Disposition and follow-up:   Mr.Theodore Wood was discharged from [facility] in Good condition.    Follow-up Appointments: Follow-up Information    Follow up with Your [facility] P[facility]. (Please call your [facility] primary doctor to arrange a hospital follow up)         Discharge Orders    Future Orders Please Complete By Expires   Diet - low sodium heart healthy      Increase activity slowly      Discharge instructions      Comments:   1.  Stop Lipitor (Atorvastatin)  2.  Continue pushing fluids for the next few days  3.   Please call if you have a return of your symptoms  4.  Continue to use Ice and tramadol for your hand and foot swelling.  That should improve in the next few days.  5.  Continue your home medications as prescribed  6.  Please call your [facility] primary doctor to arrange for a hospital follow up visit.   Call MD for:  severe uncontrolled pain      Call MD for:  difficulty breathing, headache or visual disturbances        Consultations:  None  Procedures Performed:  Dg Chest 2 View 02/03/2012  IMPRESSION: No acute cardiopulmonary abnormality.  Original Report Authenticated By: Harley Hallmark, M.D.   Admission HPI: Mr.Theodore Wood is a 61 y.o. AA man and a [facility]; with a history of HTN, HLD and ETOH and cocaine use, presents to [facility] ED with left sided CP and "body aches all over" for at least one week duration. Patient states that the CP is left sided,  sharp 8/10 in intensity, intermittent, radiates up to his right neck, and started while being asleep in bed. He reports mild nausea, SOB and mild sweats but no orthopnea, PND, vomiting, fever, chills, cough, pleurisy, HA, black outs, blurry vision, sore throat, abdominal pain, black/blody stools, hematuria, anuria, oliguria or dysuria. Patient denies any recent travel, trauma. Last Alcohol drink yesterday on 02/02/12; last crack cocaine use 01/31/12. Patient states that the CP and body aches are worse with movement and not relieved with "BC's or aspirin" (last does 02/01/12). States that has had similar CP 7 years ago for which he had a full work up including cath angio in [facility] "and was normal."   Recent prescription of new medications by his PCP at the [place] include atorvastatin, HCTZ, a PPI and fish oil on 01/28/12.   Hospital Course by problem list: 1. Rhabdomyolysis A work-up for cardiac chest pain was initiated in the ED including cardiac enzymes which was negative for troponin but notable for CK of 1198.   Upon further questioning, he denied  any seizures, crush injuries, prolonged immobility, or strenuous exercise. TSH was also wnl. Patient's CKs trended 1198--> 1965-->1696-->1710.Marland Kitchen He received NS IVF 125 cc/hr x12hr and 200cc/hr x24 hrs to facilitate diuresis. His renal function remained normal with Cr of 0.98 and Mg, Phos and Ca also within normal limits. He remained euvolemic during admission. We suspected rhabdomyolysis 2/2 statin myopathy, as patient's onset of myalgias was consistent with initiation of atorvastaton on 01/28/12 and he had no other identifiable inciting event. He was cocaine + on UDS at admission, which could also cause a drug-induced myopathy. However, he has been using cocaine chronically and had never had these symptoms previously. His TChol was 159 on admission with  LDL of 76. We will not discharge him on any lipid therapy as LDL is below goal, and will defer to PCP. He should avoid atorvastatin in the future and a trial of a hydrophilic statin such as crestor or pravastatin may be appropriate for him.   2. Chest pain  On physical exam, Mr. Adduci left-sided chest pain was reproducible upon left arm abduction and palpation over his pectoralis muscle. EKG did not demonstrate any ST segment changes suggestive of infarct, and troponin was negative x3. His symptoms were most consistent with myalgias from rhabdomyolysis and his pain resolved with hydration. No further workup was indicated.  3. R hand painful swelling  The morning after his admission, Mr. Chavarin complained of painful swelling of R hand and wrist. He had previously had IV access which had infiltrated and was replaced on his L arm. His R hand was swollen and painful and he had decreased grip strength and wrist flexion and extension 2/2 pain. He had good perfusion of R hand and 2+ radial pulse. Ice and tramadol was provided for symptomatic relief.   4. HTN  Pt was on HCTZ at home for HTN. The medication was initially held during admission but was restarted on  day 2 due to HTN w pressures 150s/90s. His pressure did rise to 190s/90s, but we were aggressively hydrating him w IVF.He was hypertensive throughout admission but did not require any prn anti-HTN. Will discharge him on home dose of HCTZ  5. ETOH abuse  Mr. Conry acknowledges excessive EtOH use but does not believe that he needs to cut back on his drinking. He denies any history of DTs/ withdrawal sx. He was placed on seizure/fall precautions and CIWA protocol. He did not demonstrate any withdrawal symptoms and  did not require any prn ativan. B12 was within normal limits at 737.  6. Sleep disturbance Pt complained of difficulty sleeping during his hospital stay. He was given Ambien 5mg  the first evening and complained that he had poor sleep latency due to noise in hospital. The second night he was provided an increased dose of Ambien 10mg .   Discharge Vitals:  BP 165/89  Pulse 100  Temp(Src) 99.5 F (37.5 C) (Oral)  Resp 20  Ht 5\' 6"  (1.676 m)  Wt 167 lb (75.751 kg)  BMI 26.95 kg/m2  SpO2 98%  Discharge Labs:  Results for orders placed during the hospital encounter of 02/03/12 (from the past 24 hour(s))  BASIC METABOLIC PANEL     Status: Normal   Collection Time   02/04/12  4:47 PM      Component Value Range   Sodium 136  135 - 145 (mEq/L)   Potassium 3.7  3.5 - 5.1 (mEq/L)   Chloride 103  96 - 112 (mEq/L)   CO2 25  19 - 32 (mEq/L)   Glucose, Bld 96  70 - 99 (mg/dL)   BUN 11  6 - 23 (mg/dL)   Creatinine, Ser 2.13  0.50 - 1.35 (mg/dL)   Calcium 9.0  8.4 - 08.6 (mg/dL)   GFR calc non Af Amer >90  >90 (mL/min)   GFR calc Af Amer >90  >90 (mL/min)  CK     Status: Abnormal   Collection Time   02/04/12  4:47 PM      Component Value Range   Total CK 1696 (*) 7 - 232 (U/L)  BASIC METABOLIC PANEL     Status: Abnormal   Collection Time   02/05/12  5:30 AM      Component Value Range   Sodium 138  135 - 145 (mEq/L)   Potassium 3.5  3.5 - 5.1 (mEq/L)   Chloride 106  96 - 112 (mEq/L)    CO2 24  19 - 32 (mEq/L)   Glucose, Bld 101 (*) 70 - 99 (mg/dL)   BUN 7  6 - 23 (mg/dL)   Creatinine, Ser 5.78  0.50 - 1.35 (mg/dL)   Calcium 8.6  8.4 - 46.9 (mg/dL)   GFR calc non Af Amer >90  >90 (mL/min)   GFR calc Af Amer >90  >90 (mL/min)  MAGNESIUM     Status: Normal   Collection Time   02/05/12  5:30 AM      Component Value Range   Magnesium 1.7  1.5 - 2.5 (mg/dL)  PHOSPHORUS     Status: Normal   Collection Time   02/05/12  5:30 AM      Component Value Range   Phosphorus 3.3  2.3 - 4.6 (mg/dL)  CK     Status: Abnormal   Collection Time   02/05/12  5:30 AM      Component Value Range   Total CK 1719 (*) 7 - 232 (U/L)   Signed: Loray Akard 02/05/2012, 1:23 PM   Haward Pope 02/05/2012 1:23 PM

## 2012-02-04 NOTE — H&P (Signed)
Internal Medicine Attending Admission Note Date: 02/04/2012  Patient name: Theodore Wood Medical record number: [id number] Date of birth: [birth year] Age: 61 y.o. Gender: male  I saw and evaluated the patient. I reviewed the resident's note and I agree with the resident's findings and plan as documented in the resident's note.  Chief Complaint(s):  Diffuse muscle pains  History - key components related to admission:  Theodore Wood is a 61 year old man with a history of hypertension, hyperlipidemia, alcohol abuse, and cocaine abuse who developed total body muscle aches one week ago. These aches generalized to the chest more recently. He described the pain as a cramping, 8/10, worse with arm abduction. He had mild nausea, some shortness of breath and diaphoresis as well. He denied any abdominal pain, dark stools, dark urine, hematuria, dysuria or recent travel. Of note one week ago, prior to the muscle pains beginning, he was started on atorvastatin for his hyperlipidemia. His pain did not improve with aspirin. He therefore presented to the [facility] Emergency Department for further evaluation.  Physical Exam - key components related to admission:  Filed Vitals:   02/03/12 1930 02/03/12 2100 02/04/12 0500 02/04/12 0900  BP:  130/79 155/97 147/98  Pulse:  82 89 99  Temp:  98.4 F (36.9 C) 98.2 F (36.8 C) 98.6 F (37 C)  TempSrc:  Oral Oral Oral  Resp:  20 18 20   Height: 5\' 6"  (1.676 m)     Weight: 167 lb (75.751 kg)     SpO2:  95% 93% 99%   General: Well-developed, well-nourished man lying comfortably in bed in no acute distress. Lungs: Clear to auscultation bilaterally without wheezes, rhonchi, or rales. Heart: Regular rate and rhythm, without murmurs, rubs, or gallops. Abdomen: Soft, non tender, active bowel sounds, without masses. Extremities: Right upper extremity hand and wrist slightly swollen. There was no warmth although the patient was icing it at the time of exam. There was  also no erythema or palpable cords. His lateral wrist was the site of a recent intravenous line which has since been removed. No other edema. Muscles: Currently without any tenderness to palpation.  Lab results:  Basic Metabolic Panel:  Basename 02/04/12 0655 02/03/12 1542 02/03/12 0837  NA 137 -- 139  K 3.8 -- 4.1  CL 104 -- 99  CO2 25 -- --  GLUCOSE 104* -- 102*  BUN 11 -- 11  CREATININE 0.98 0.89 --  CALCIUM 9.2 9.7 --  MG -- 2.0 --  PHOS -- 3.3 --   Liver Function Tests:  [facility] 02/03/12 1542  AST 56*  ALT 44  ALKPHOS 94  BILITOT 0.4  PROT 7.4  ALBUMIN 3.8    Basename 02/03/12 1542  LIPASE --  AMYLASE 74   CBC:  Basename 02/03/12 1542 02/03/12 0837 02/03/12 0745  WBC 6.9 -- 6.8  NEUTROABS -- -- 2.8  HGB 15.7 17.3* --  HCT 44.2 51.0 --  MCV 88.9 -- 89.3  PLT 221 -- 237   Cardiac Enzymes:  Basename 02/04/12 0909 02/04/12 0655 02/03/12 2314 02/03/12 1535  CKTOTAL 1857* 1802* 1965* --  CKMB -- 4.6* 5.0* 5.9*  CKMBINDEX -- -- -- --  TROPONINI -- <0.30 <0.30 <0.30   Hemoglobin A1C:  Basename 02/03/12 1542  HGBA1C 5.6   Fasting Lipid Panel:  Basename 02/04/12 0655  CHOL 159  HDL 34*  LDLCALC 67  TRIG 284*  CHOLHDL 4.7  LDLDIRECT --   Thyroid Function Tests:  Basename 02/03/12 1542  TSH 3.128  T4TOTAL --  FREET4 --  T3FREE --  THYROIDAB --   Anemia Panel:  Basename 02/03/12 1542  VITAMINB12 737  FOLATE --  FERRITIN --  TIBC --  IRON --  RETICCTPCT --   Coagulation:  Basename 02/04/12 0655  INR 1.00   Urine Drug Screen:  Positive for cocaine  Urinalysis:  Negative  Misc. Labs:  Normal B12 and TSH  Imaging results:  Dg Chest 2 View  02/03/2012  *RADIOLOGY REPORT*  Clinical Data: 61 year old male with chest pain, chills, shortness of breath.  CHEST - 2 VIEW  Comparison: None.  Findings: Lung volumes are within normal limits. Normal cardiac size and mediastinal contours.  Visualized tracheal air column is within normal  limits.  No pneumothorax, pulmonary edema, pleural effusion or confluent pulmonary opacity. No acute osseous abnormality identified.  IMPRESSION: No acute cardiopulmonary abnormality.  Original Report Authenticated By: Harley Hallmark, M.D.   Other results:  EKG:  Normal sinus rhythm at 85 beats per minute inferior T wave inversions which pseudo-normalized on a subsequent EKG this morning.  Assessment & Plan by Problem:  Theodore Wood is a 61 year old man with a history of hypertension, hyperlipidemia, alcohol abuse, and cocaine who presents with a one-week history of progressive muscle aches that are diffuse. These aches began after he started to take atorvastatin for his hyperlipidemia. He also is positive for cocaine on his urine drug screen and this can cause a myopathy. He's had no other crush injuries or compressive injuries. Thus, given the timing of his muscle aches with the start of the atorvastatin therapy it is quite likely this presentation represents n statin related myopathy. With the slight elevation in CK levels he has a very, very mild rhabdomyolysis.  1) Statin related myopathy: Theodore Wood symptoms have resolved today. His CK is slowly dropping. His urine output has remained robust and his creatinine has been stable. We will continue with aggressive IV hydration. We will also recommend that he avoid any atorvastatin in the future.  2) Infiltrated IV of the right wrist: Will use ice to decrease swelling.  3) Disposition: If Theodore Wood continues to do well it is likely he will be discharged home tomorrow with follow up in the [place] with his primary care provider.

## 2012-02-05 LAB — BASIC METABOLIC PANEL
CO2: 24 mEq/L (ref 19–32)
Calcium: 8.6 mg/dL (ref 8.4–10.5)
Creatinine, Ser: 0.93 mg/dL (ref 0.50–1.35)
GFR calc non Af Amer: >90 mL/min (ref >90)
Glucose, Bld: 101 mg/dL — ABNORMAL HIGH (ref 70–99)

## 2012-02-05 LAB — MAGNESIUM: Magnesium: 1.7 mg/dL (ref 1.5–2.5)

## 2012-02-05 MED ORDER — ASPIRIN 81 MG PO TBEC: 81.0000 mg | DELAYED_RELEASE_TABLET | Freq: Every day | ORAL | Status: AC

## 2012-02-05 MED ORDER — TRAMADOL HCL 50 MG PO TABS: 50.0000 mg | ORAL_TABLET | Freq: Four times a day (QID) | ORAL | Status: AC | PRN

## 2012-02-05 NOTE — Progress Notes (Signed)
Subjective: Theodore Wood in sitting in bed comfortably. He says that most of his pains have gone away and he is no longer having L sided chest pain. He reports that he slept well last night and has been urinating frequently. He dose complain of continued R hand swelling and pain. He has used ice intermittently.  He denies fevers, SOB, dizziness, confusion or changes in his vision.   Objective: Vital signs in last 24 hours: Filed Vitals:   02/04/12 0900 02/04/12 1500 02/04/12 2100 02/05/12 0500  BP: 147/98 164/97 190/97 165/89  Pulse: 99 80 94 100  Temp: 98.6 F (37 C) 97.9 F (36.6 C) 98.9 F (37.2 C) 99.5 F (37.5 C)  TempSrc: Oral Oral Oral Oral  Resp: 20 20 20 20   Height:      Weight:      SpO2: 99% 100% 99% 98%   Weight change:   Intake/Output Summary (Last 24 hours) at 02/05/12 0813 Last data filed at 02/04/12 2100  Gross per 24 hour  Intake    160 ml  Output    350 ml  Net   -190 ml   Physical Exam: General: resting in bed HEENT: PERRL, EOMI, no scleral icterus Cardiac: RRR, no rubs, murmurs or gallops Pulm: clear to auscultation bilaterally, moving normal volumes of air Abd: soft, nontender, nondistended, BS present Ext: R hand is moderately swollen from wrist through all digits, swelling has decreased somewhat from yesterday. . Skin is taught and warm.  Radial pulse is 2+, IV inserted in opposite forearm vein. Poor wrist extension and grip strength 2/2 pain. Hand and wrist diffusely tender to palpation.  Neuro: alert and oriented X3, cranial nerves II-XII grossly intact  Lab Results: Basic Metabolic Panel:  Lab 02/05/12 1610 02/04/12 1647 07-Feb-2012 1542  NA 138 136 --  K 3.5 3.7 --  CL 106 103 --  CO2 24 25 --  GLUCOSE 101* 96 --  BUN 7 11 --  CREATININE 0.93 0.93 --  CALCIUM 8.6 9.0 --  MG 1.7 -- 2.0  PHOS 3.3 -- 3.3   Liver Function Tests:  Lab 02/07/2012 1542  AST 56*  ALT 44  ALKPHOS 94  BILITOT 0.4  PROT 7.4  ALBUMIN 3.8   Cardiac  Enzymes:  Lab 02/05/12 0530 02/04/12 1647 02/04/12 0909 02/04/12 0655 02-07-2012 2314 Feb 07, 2012 1535  CKTOTAL 1719* 1696* 1857* -- -- --  CKMB -- -- -- 4.6* 5.0* 5.9*  CKMBINDEX -- -- -- -- -- --  TROPONINI -- -- -- <0.30 <0.30 <0.30   Fasting Lipid Panel:  Lab 02/04/12 0655  CHOL 159  HDL 34*  LDLCALC 67  TRIG 960*  CHOLHDL 4.7  LDLDIRECT --   Studies/Results: Dg Chest 2 View 07-Feb-2012  IMPRESSION: No acute cardiopulmonary abnormality.    Medications: I have reviewed the patient's current medications. Scheduled Meds:    . aspirin EC  325 mg Oral Daily  . fluticasone  2 spray Each Nare Daily  . folic acid  1 mg Oral BID  . heparin  5,000 Units Subcutaneous Q8H  . hydrochlorothiazide  25 mg Oral Daily  . influenza  inactive virus vaccine  0.5 mL Intramuscular Tomorrow-1000  . LORazepam  0-4 mg Intravenous Q6H   Followed by  . LORazepam  0-4 mg Intravenous Q12H  . mulitivitamin with minerals  1 tablet Oral Daily  . pantoprazole  40 mg Oral Q1200  . sodium chloride  3 mL Intravenous Q12H  . thiamine  100 mg Oral Daily  Continuous Infusions:    . sodium chloride 200 mL/hr at 02/05/12 0534   PRN Meds:.docusate sodium, LORazepam, LORazepam, morphine, nitroGLYCERIN, traMADol, zolpidem  Assessment/Plan: ASSESSMENT:  [name] is a 61 y/o AA man with multiple risk factors for CAD including age, gender, HTN, HLD, cocaine use and FMH of DM and CAD p/w myalgias and elevated CK.  1. Chest pain Mr. Marte presents with a week of left-sided chest pain that is worse with left arm abduction and is reproducible on exam. His pattern of pain, reproducibility, concurrent history of myalgias, and elevated CK is reassuring for noncardiac origin of chest pain. Additionally, cycled troponins have remained negative and EKG does not show any ST segment changes to suggest ischemia.  Chest pain is no longer bothering him this morning. His symptoms are most likely 2/2 myalgias from  rhabdomyolysis. - Continue hydration for rhabdo as below  2. Rhabdomyolysis  Patient's onset of myalgias is consistent with initiation of atorvastaton on 01/28/12. He has denied any seizures, crush injuries, prolonged immobility, or strenuous exercise. TSH is also wnl. Patient's CKs are trending 1198--> 1658 -->1965-->1802-->1857-->1696-->1719.. We increased his fluids from 125 cc/hr to 200cc/hr to facilitate diuresis. His renal function has remained normal with Cr of 0.93 today and Mg, Phos and Ca also within normal limits. Suspect rhabdomyolysis 2/2 statin myopathy. TChol 159 on admission w LDL 67. - Have d/c atorvastatin. LDL is 67 on admission. LDL at 76 now, will not restart on d/c. - NS 200cc/hr. - Will d/c fluids for discharge.  3. R hand painful swelling Patient endorses swelling over one day. He has good perfusion but skin now taught, painful to touch and he has limited ROM. IV had previously infiltrated R forearm vein. He is using ice occasionally for the pain and has IV w L venous access now. Clinically, swelling looks slightly improved compared to yesterday and pulses are strong.  - Encouraged continued use of ice pack for swelling -Provided prn tramadol for pain.    4. HTN  -continue with HCTZ home dose. Has been volume replaced w NS 200cc/hr which may account for some of his higher pressures here. Will discharge on same home dose.  -no beta blockers w cocaine history -Heart Healthy diet   5. ETOH abuse  Mr. Jost acknowledges excessive EtOH use. He denies any history of DTs/ withdrawal sx.  -Seizure precatuions, fall precautions  -CIWA protocol with scheduled Ativan  -Folate,Thiamine and MVI daily  - B12 is 737  6. PPx:  - heparin 5000 Units SQ q8 hr   7. Dispo -Anticipate discharge to home today.   LOS: 2 days   Bronson Curb 02/05/2012, 8:13 AM  Ebba Goll 02/05/2012 1:12 PM

## 2012-02-05 NOTE — Discharge Instructions (Signed)
1.  Stop Lipitor (Atorvastatin)  2.  Continue pushing fluids for the next few days  3.  Please call if you have a return of your symptoms  4.  Continue to use Ice and tramadol for your hand and foot swelling.  That should improve in the next few days.  5.  Continue your home medications as prescribed  6.  Please call your [facility] to arrange for a hospital follow up visit.

## 2012-02-05 NOTE — Progress Notes (Signed)
Pt has complained multiple times today about right hand pain. Pt has been medicated but still complains of pain. MD notified but no new orders given md stated to continue with discharge

## 2012-02-05 NOTE — Progress Notes (Signed)
Internal Medicine Attending  Date: 02/05/2012  Patient name: Theodore Wood Medical record number: [id number] Date of birth: Aug 10, 1951 Age: 61 y.o. Gender: male  I saw and evaluated the patient. I reviewed the resident's note by Dr. Tonny Branch and I agree with the resident's findings and plans as documented in his progress note.  [name] was much improved this morning with no muscle pain whatsoever. He had slight pain in his foot and in his hand. The pain in the hand was most likely related to the infiltrated IV in his wrist earlier in the admission. The swelling is much decreased compared to yesterday. His presentation is most consistent with atorvastatin associated myopathy and very mild rhabdomyolysis. He is stable for discharge and atorvastatin will be stopped. He's to follow up with his primary care provider in the [facility]. I agree with housestaff's plan to discharge him home today.

## 2012-02-08 LAB — HIV-1 RNA QUANT-NO REFLEX-BLD: HIV-1 RNA Quant, Log: <1.3 {Log} (ref <1.30)

## 2012-02-08 LAB — DRUG SCREEN PANEL (SERUM)

## 2013-02-10 IMAGING — CR DG CHEST 2V
2 series · 2 of 2 positions shown · non-contrast
Comparison: None.

CLINICAL DATA: 60-year-old male with chest pain, chills, shortness
of breath.

CHEST - 2 VIEW

[w chest pa]
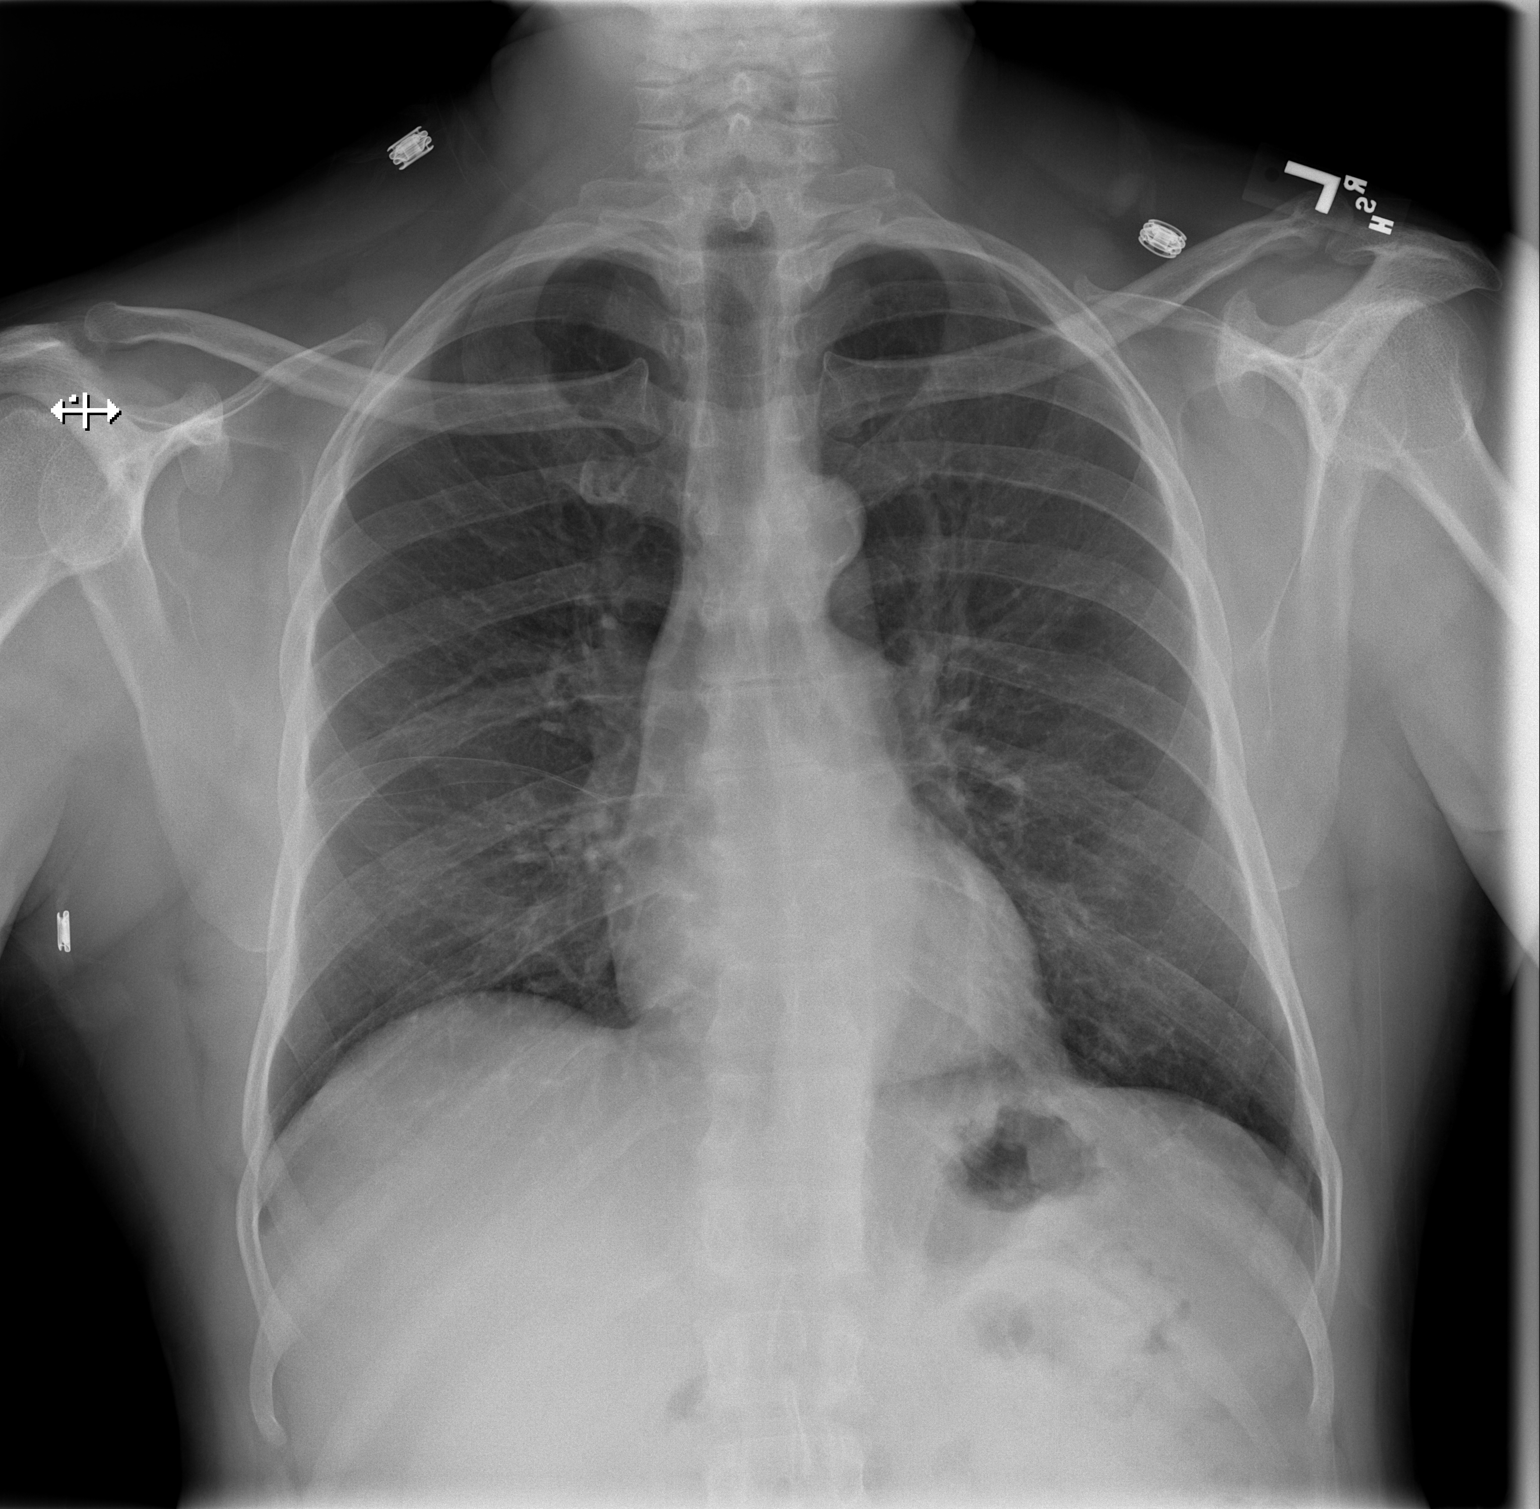

[w chest lat]
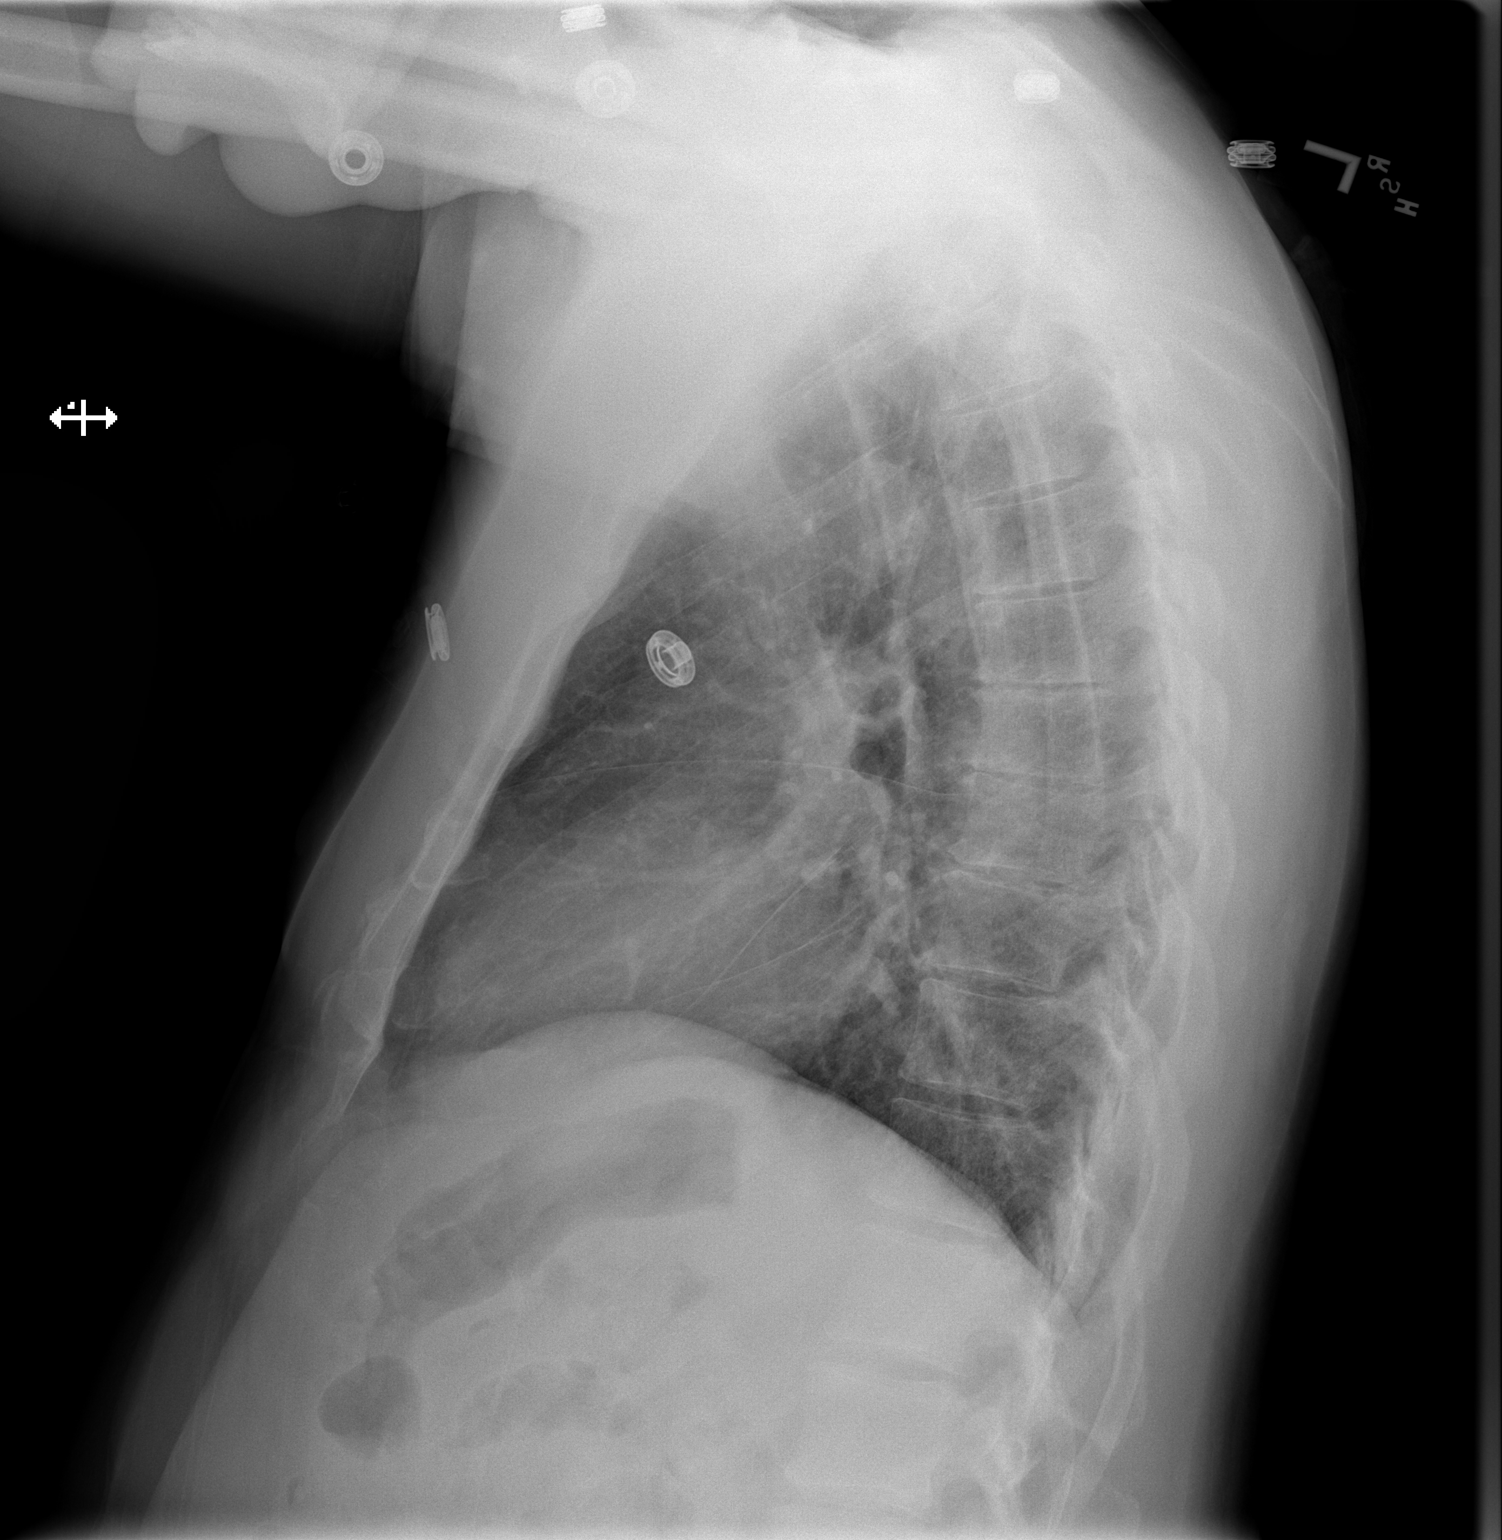

[2 of 2 positions shown; findings below may reference images not displayed]

FINDINGS: Lung volumes are within normal limits. Normal cardiac
size and mediastinal contours.  Visualized tracheal air column is
within normal limits.  No pneumothorax, pulmonary edema, pleural
effusion or confluent pulmonary opacity. No acute osseous
abnormality identified.
IMPRESSION: No acute cardiopulmonary abnormality.

## 2013-12-25 ENCOUNTER — Emergency Department (HOSPITAL_COMMUNITY): Payer: Non-veteran care

## 2013-12-25 ENCOUNTER — Emergency Department (HOSPITAL_COMMUNITY)
Admission: EM | Admit: 2013-12-25 | Discharge: 2013-12-25 | Disposition: A | Discharge status: Home / Self Care | Payer: Non-veteran care | Attending: Emergency Medicine | Admitting: Emergency Medicine

## 2013-12-25 ENCOUNTER — Encounter (HOSPITAL_COMMUNITY): Payer: Self-pay | Admitting: Emergency Medicine

## 2013-12-25 DIAGNOSIS — R269 Unspecified abnormalities of gait and mobility: Secondary | ICD-10-CM | POA: Insufficient documentation

## 2013-12-25 DIAGNOSIS — K219 Gastro-esophageal reflux disease without esophagitis: Secondary | ICD-10-CM | POA: Insufficient documentation

## 2013-12-25 DIAGNOSIS — Y9301 Activity, walking, marching and hiking: Secondary | ICD-10-CM | POA: Insufficient documentation

## 2013-12-25 DIAGNOSIS — Z79899 Other long term (current) drug therapy: Secondary | ICD-10-CM | POA: Insufficient documentation

## 2013-12-25 DIAGNOSIS — M25469 Effusion, unspecified knee: Secondary | ICD-10-CM | POA: Insufficient documentation

## 2013-12-25 DIAGNOSIS — M25461 Effusion, right knee: Secondary | ICD-10-CM

## 2013-12-25 DIAGNOSIS — M25569 Pain in unspecified knee: Secondary | ICD-10-CM | POA: Insufficient documentation

## 2013-12-25 DIAGNOSIS — Z8679 Personal history of other diseases of the circulatory system: Secondary | ICD-10-CM | POA: Insufficient documentation

## 2013-12-25 DIAGNOSIS — I10 Essential (primary) hypertension: Secondary | ICD-10-CM | POA: Insufficient documentation

## 2013-12-25 DIAGNOSIS — E7800 Pure hypercholesterolemia, unspecified: Secondary | ICD-10-CM | POA: Insufficient documentation

## 2013-12-25 DIAGNOSIS — W010XXA Fall on same level from slipping, tripping and stumbling without subsequent striking against object, initial encounter: Secondary | ICD-10-CM | POA: Insufficient documentation

## 2013-12-25 DIAGNOSIS — Y929 Unspecified place or not applicable: Secondary | ICD-10-CM | POA: Insufficient documentation

## 2013-12-25 DIAGNOSIS — Z87891 Personal history of nicotine dependence: Secondary | ICD-10-CM | POA: Insufficient documentation

## 2013-12-25 DIAGNOSIS — M25561 Pain in right knee: Secondary | ICD-10-CM

## 2013-12-25 LAB — SYNOVIAL CELL COUNT + DIFF, W/ CRYSTALS
LYMPHOCYTES-SYNOVIAL FLD: 4 % (ref 0–20)
Monocyte-Macrophage-Synovial Fluid: 22 % — ABNORMAL LOW (ref 50–90)
Neutrophil, Synovial: 74 % — ABNORMAL HIGH (ref 0–25)
WBC, Synovial: 37000 /mm3 — ABNORMAL HIGH (ref 0–200)

## 2013-12-25 LAB — GLUCOSE, SYNOVIAL FLUID: Glucose, Synovial Fluid: 66 mg/dL

## 2013-12-25 LAB — GRAM STAIN

## 2013-12-25 MED ORDER — HYDROCODONE-ACETAMINOPHEN 5-325 MG PO TABS
1.0000 | ORAL_TABLET | Freq: Once | ORAL | Status: AC
  Administered 2013-12-25: 1 via ORAL
  Filled 2013-12-25: qty 1

## 2013-12-25 MED ORDER — HYDROCODONE-ACETAMINOPHEN 5-325 MG PO TABS: 1.0000 | ORAL_TABLET | ORAL | Status: DC | PRN

## 2013-12-25 MED ORDER — BUPIVACAINE HCL 0.5 % IJ SOLN: 10.0000 mL | Freq: Once | INTRAMUSCULAR | Status: DC

## 2013-12-25 MED ORDER — BUPIVACAINE HCL (PF) 0.5 % IJ SOLN
10.0000 mL | Freq: Once | INTRAMUSCULAR | Status: AC
  Administered 2013-12-25: 10 mL

## 2013-12-25 MED ORDER — LIDOCAINE HCL (PF) 1 % IJ SOLN
INTRAMUSCULAR | Status: AC
  Filled 2013-12-25: qty 5

## 2013-12-25 NOTE — ED Provider Notes (Signed)
CSN: [id number]     Arrival date & time 12/25/13  1450 History   None   This chart was scribed for Harvie Heck PA-C, a non-physician practitioner working with No att. providers found by Denice Bors, ED Scribe. This patient was seen in room TR08C/TR08C and the patient's care was started at 3:49 PM     Chief Complaint  Patient presents with  . Knee Pain   (Consider location/radiation/quality/duration/timing/severity/associated sxs/prior Treatment) The history is provided by the patient. No language interpreter was used.   HPI Comments: LARAMIE MEISSNER is a 63 y.o. male who presents to the Emergency Department complaining of constant right knee pain onset 7 days following a mechanical fall tripping up a hill while carrying groceries and falling on right knee. Describes pain as 10/10 in severity and gradually worsening. Reports associated swelling. Reports pain is exacerbated by touch and movement. Reports trying cold compress, hot compress, Tylenol, Advil, and ace wrap with no relief of symptoms. Denies associated fever, other trauma, numbness, weakness, and other injuries. Denies PMHx of previous injury to knee, and previous surgery.   Past Medical History  Diagnosis Date  . Hypertension   . Angina   . GERD (gastroesophageal reflux disease)   . High cholesterol    Past Surgical History  Procedure Laterality Date  . Cardiac catheterization  1990's   History reviewed. No pertinent family history. History  Substance Use Topics  . Smoking status: Former Smoker -- 1.00 packs/day for 20 years    Types: Cigarettes    Quit date: 02/02/2002  . Smokeless tobacco: Never Used  . Alcohol Use: 25.2 oz/week    42 Cans of beer per week    Review of Systems  Constitutional: Negative for fever and chills.  Musculoskeletal: Positive for arthralgias, gait problem and joint swelling.  Skin: Negative for rash and wound.  Neurological: Negative for weakness and numbness.   Psychiatric/Behavioral: Negative for confusion.  A complete 10 system review of systems was obtained and all systems are negative except as noted in the HPI and PMHx.     Allergies  Review of patient's allergies indicates no known allergies.  Home Medications   Current Outpatient Rx  Name  Route  Sig  Dispense  Refill  . fish oil-omega-3 fatty acids 1000 MG capsule   Oral   Take 1 g by mouth 2 (two) times daily.         . flunisolide (NASALIDE) 0.025 % SOLN   Nasal   Place 1 spray into the nose daily.         . hydrochlorothiazide (HYDRODIURIL) 25 MG tablet   Oral   Take 25 mg by mouth daily.         Marland Kitchen HYDROcodone-acetaminophen (NORCO/VICODIN) 5-325 MG per tablet   Oral   Take 1 tablet by mouth every 4 (four) hours as needed.   10 tablet   0    BP 161/94  Pulse 109  Temp(Src) 98.2 F (36.8 C) (Oral)  Resp 18  Wt 173 lb 7 oz (78.671 kg)  SpO2 98% Physical Exam  Nursing note and vitals reviewed. Constitutional: He is oriented to person, place, and time. He appears well-developed and well-nourished. No distress.  HENT:  Head: Normocephalic and atraumatic.  Eyes: EOM are normal.  Neck: Neck supple.  Cardiovascular: Normal rate.   Pulmonary/Chest: Effort normal. No respiratory distress.  Musculoskeletal:       Right knee: He exhibits decreased range of motion, swelling and effusion. He exhibits  no ecchymosis, no deformity, no erythema, no LCL laxity and no MCL laxity. Tenderness found. Medial joint line and lateral joint line tenderness noted.  Decreased ROM and pain of right knee with flexion and extension of right knee.  Moderate swelling of the left knee.No laxity with valgus and varus strain of right knee No crepitus   Neurological: He is alert and oriented to person, place, and time.  Skin: Skin is warm and dry.  Psychiatric: He has a normal mood and affect. His behavior is normal.    ED Course  Procedures  COORDINATION OF CARE:  Nursing notes  reviewed. Vital signs reviewed. Initial pt interview and examination performed.   3:53 PM-Discussed work up plan with pt at bedside, which includes x-ray of right knee. Pt agrees with plan.   Treatment plan initiated: Medications  HYDROcodone-acetaminophen (NORCO/VICODIN) 5-325 MG per tablet 1 tablet (1 tablet Oral Given 12/25/13 1558)  bupivacaine (MARCAINE) 0.5 % injection 10 mL (10 mLs Infiltration Given by Other 12/25/13 1950)  HYDROcodone-acetaminophen (NORCO/VICODIN) 5-325 MG per tablet 1 tablet (1 tablet Oral Given 12/25/13 1746)     Initial diagnostic testing ordered.    Labs Review Labs Reviewed  SYNOVIAL CELL COUNT + DIFF, W/ CRYSTALS - Abnormal; Notable for the following:    Color, Synovial RED (*)    Appearance-Synovial TURBID (*)    WBC, Synovial 37000 (*)    Neutrophil, Synovial 74 (*)    Monocyte-Macrophage-Synovial Fluid 22 (*)    All other components within normal limits  GRAM STAIN  BODY FLUID CULTURE  GLUCOSE, SYNOVIAL FLUID   Imaging Review Dg Knee Complete 4 Views Right  12/25/2013   CLINICAL DATA:  Fall 10 days ago.  Pain.  EXAM: RIGHT KNEE - COMPLETE 4+ VIEW  COMPARISON:  None.  FINDINGS: No fracture or dislocation detected.  Moderate size joint effusion. Result of internal derangement not excluded. MR can be obtained for further delineation if this is of high clinical concern.  Evidence of prior Pellegrini Stieda disease.  Patellofemoral joint degenerative changes.  IMPRESSION: No fracture or dislocation detected.  Moderate size joint effusion. Result of internal derangement not excluded. MR can be obtained for further delineation if this is of high clinical concern.  Evidence of prior Pellegrini Stieda disease.  Patellofemoral joint degenerative changes.   Electronically Signed   By: Chauncey Cruel M.D.   On: 12/25/2013 15:52    EKG Interpretation   None       MDM   1. Right knee pain   2. Knee effusion, right    Pt with a mechanical fall.  Moderate  swelling to left knee without erythema. Doubt septic joint. Discussed patient history, condition, and XR with Dr. Wilson Singer who agrees to evaluate the patient.  Attempted to perform a medial arthrocentesis without success.  Dr. Wilson Singer performed the Arthrocentesis from the lateral approach.  Cell count shows WBC 37000, no organisms on gram stain.   Discussed lab results, imaging results, and treatment plan with the patient. Return precautions given. Reports understanding and no other concerns at this time.  Patient is stable for discharge at this time.  Meds given in ED:  Medications  HYDROcodone-acetaminophen (NORCO/VICODIN) 5-325 MG per tablet 1 tablet (1 tablet Oral Given 12/25/13 1558)  bupivacaine (MARCAINE) 0.5 % injection 10 mL (10 mLs Infiltration Given by Other 12/25/13 1950)  HYDROcodone-acetaminophen (NORCO/VICODIN) 5-325 MG per tablet 1 tablet (1 tablet Oral Given 12/25/13 1746)    Discharge Medication List as of 12/25/2013  7:39  PM    START taking these medications   Details  HYDROcodone-acetaminophen (NORCO/VICODIN) 5-325 MG per tablet Take 1 tablet by mouth every 4 (four) hours as needed., Starting 12/25/2013, Until Discontinued, Print        I personally performed the services described in this documentation, which was scribed in my presence. The recorded information has been reviewed and is accurate.    Lorrine Kin, PA-C 12/26/13 0001

## 2013-12-25 NOTE — Discharge Instructions (Signed)
Call for a follow up appointment with a Family or Primary Care Provider.  Return if Symptoms worsen.   Take medication as prescribed.   Cryotherapy Cryotherapy means treatment with cold. Ice or gel packs can be used to reduce both pain and swelling. Ice is the most helpful within the first 24 to 48 hours after an injury or flareup from overusing a muscle or joint. Sprains, strains, spasms, burning pain, shooting pain, and aches can all be eased with ice. Ice can also be used when recovering from surgery. Ice is effective, has very few side effects, and is safe for most people to use. PRECAUTIONS  Ice is not a safe treatment option for people with:  Raynaud's phenomenon. This is a condition affecting small blood vessels in the extremities. Exposure to cold may cause your problems to return.  Cold hypersensitivity. There are many forms of cold hypersensitivity, including:  Cold urticaria. Red, itchy hives appear on the skin when the tissues begin to warm after being iced.  Cold erythema. This is a red, itchy rash caused by exposure to cold.  Cold hemoglobinuria. Red blood cells break down when the tissues begin to warm after being iced. The hemoglobin that carry oxygen are passed into the urine because they cannot combine with blood proteins fast enough.  Numbness or altered sensitivity in the area being iced. If you have any of the following conditions, do not use ice until you have discussed cryotherapy with your caregiver:  Heart conditions, such as arrhythmia, angina, or chronic heart disease.  High blood pressure.  Healing wounds or open skin in the area being iced.  Current infections.  Rheumatoid arthritis.  Poor circulation.  Diabetes. Ice slows the blood flow in the region it is applied. This is beneficial when trying to stop inflamed tissues from spreading irritating chemicals to surrounding tissues. However, if you expose your skin to cold temperatures for too long or  without the proper protection, you can damage your skin or nerves. Watch for signs of skin damage due to cold. HOME CARE INSTRUCTIONS Follow these tips to use ice and cold packs safely.  Place a dry or damp towel between the ice and skin. A damp towel will cool the skin more quickly, so you may need to shorten the time that the ice is used.  For a more rapid response, add gentle compression to the ice.  Ice for no more than 10 to 20 minutes at a time. The bonier the area you are icing, the less time it will take to get the benefits of ice.  Check your skin after 5 minutes to make sure there are no signs of a poor response to cold or skin damage.  Rest 20 minutes or more in between uses.  Once your skin is numb, you can end your treatment. You can test numbness by very lightly touching your skin. The touch should be so light that you do not see the skin dimple from the pressure of your fingertip. When using ice, most people will feel these normal sensations in this order: cold, burning, aching, and numbness.  Do not use ice on someone who cannot communicate their responses to pain, such as small children or people with dementia. HOW TO MAKE AN ICE PACK Ice packs are the most common way to use ice therapy. Other methods include ice massage, ice baths, and cryo-sprays. Muscle creams that cause a cold, tingly feeling do not offer the same benefits that ice offers and should  not be used as a substitute unless recommended by your caregiver. To make an ice pack, do one of the following:  Place crushed ice or a bag of frozen vegetables in a sealable plastic bag. Squeeze out the excess air. Place this bag inside another plastic bag. Slide the bag into a pillowcase or place a damp towel between your skin and the bag.  Mix 3 parts water with 1 part rubbing alcohol. Freeze the mixture in a sealable plastic bag. When you remove the mixture from the freezer, it will be slushy. Squeeze out the excess air.  Place this bag inside another plastic bag. Slide the bag into a pillowcase or place a damp towel between your skin and the bag. SEEK MEDICAL CARE IF:  You develop white spots on your skin. This may give the skin a blotchy (mottled) appearance.  Your skin turns blue or pale.  Your skin becomes waxy or hard.  Your swelling gets worse. MAKE SURE YOU:   Understand these instructions.  Will watch your condition.  Will get help right away if you are not doing well or get worse. Document Released: 07/27/2011 Document Revised: 02/22/2012 Document Reviewed: 07/27/2011 [facility] Patient Information 2014 [place], [place].

## 2013-12-25 NOTE — ED Notes (Signed)
Discharge instructions reviewed with pt. Pt verbalized understanding.   

## 2013-12-25 NOTE — ED Notes (Signed)
PResents with right knee pain and edema from a fall one week ago, reports tripping while carrying groceries. Right knee swollen, cms intact, pain rated 10/10

## 2013-12-27 LAB — PATHOLOGIST SMEAR REVIEW

## 2013-12-27 LAB — GLUCOSE, CAPILLARY: GLUCOSE-CAPILLARY: 114 mg/dL — AB (ref 70–99)

## 2013-12-29 LAB — BODY FLUID CULTURE
Culture: NO GROWTH
Special Requests: NORMAL

## 2013-12-30 NOTE — ED Provider Notes (Signed)
  Medical screening examination/treatment/procedure(s) were conducted as a shared visit with non-physician practitioner(s) and myself.  I personally evaluated the patient during the encounter.  EKG Interpretation   None        ARTHOCENTESIS Performed by: Virgel Manifold Consent: Verbal consent obtained. Risks and benefits: risks, benefits and alternatives were discussed Consent given by: patient Required items: required blood products, implants, devices, and special equipment available Patient identity confirmed: verbally with patient Time out: Immediately prior to procedure a "time out" was called to verify the correct patient, procedure, equipment, support staff and site/side marked as required. Indications: knee pain, effusion. eval for crystal arthropathy, septic joint, other Joint: R knee Local anesthesia used: 2% lidocaine, ~3cc total Preparation: Patient was prepped and draped in the usual sterile fashion. Aspirate appearance: translucent pale yellow Aspirate amount: 30 ml Patient tolerance: Attempted medial approach by Harvie Heck, PA under my supervision. Second attempt by myself laterally successfully. Pt tolerated both attempts w/o apparent complication.  62yM with R knee pain. Likely from trauma with antecedent fall. XR w/o acute osseous injury. Arthrocentesis as above. 36K WBC, no organisms on gram stain, negative crystals. Doubt septic joint. Cultures sent. Symptomatic tx. Ortho FU.    Virgel Manifold, MD 12/30/13 2351

## 2014-01-17 ENCOUNTER — Emergency Department (HOSPITAL_COMMUNITY): Payer: Non-veteran care

## 2014-01-17 ENCOUNTER — Emergency Department (HOSPITAL_COMMUNITY)
Admission: EM | Admit: 2014-01-17 | Discharge: 2014-01-17 | Disposition: A | Discharge status: Home / Self Care | Payer: Non-veteran care | Attending: Emergency Medicine | Admitting: Emergency Medicine

## 2014-01-17 ENCOUNTER — Encounter (HOSPITAL_COMMUNITY): Payer: Self-pay | Admitting: Emergency Medicine

## 2014-01-17 DIAGNOSIS — I209 Angina pectoris, unspecified: Secondary | ICD-10-CM | POA: Insufficient documentation

## 2014-01-17 DIAGNOSIS — M25461 Effusion, right knee: Secondary | ICD-10-CM

## 2014-01-17 DIAGNOSIS — Z87891 Personal history of nicotine dependence: Secondary | ICD-10-CM | POA: Insufficient documentation

## 2014-01-17 DIAGNOSIS — K219 Gastro-esophageal reflux disease without esophagitis: Secondary | ICD-10-CM | POA: Insufficient documentation

## 2014-01-17 DIAGNOSIS — E7800 Pure hypercholesterolemia, unspecified: Secondary | ICD-10-CM | POA: Insufficient documentation

## 2014-01-17 DIAGNOSIS — I10 Essential (primary) hypertension: Secondary | ICD-10-CM | POA: Insufficient documentation

## 2014-01-17 DIAGNOSIS — Z79899 Other long term (current) drug therapy: Secondary | ICD-10-CM | POA: Insufficient documentation

## 2014-01-17 DIAGNOSIS — R059 Cough, unspecified: Secondary | ICD-10-CM | POA: Insufficient documentation

## 2014-01-17 DIAGNOSIS — M25469 Effusion, unspecified knee: Secondary | ICD-10-CM | POA: Insufficient documentation

## 2014-01-17 DIAGNOSIS — R05 Cough: Secondary | ICD-10-CM | POA: Insufficient documentation

## 2014-01-17 MED ORDER — OXYCODONE-ACETAMINOPHEN 5-325 MG PO TABS: 2.0000 | ORAL_TABLET | ORAL | Status: DC | PRN

## 2014-01-17 MED ORDER — OXYCODONE-ACETAMINOPHEN 5-325 MG PO TABS
2.0000 | ORAL_TABLET | Freq: Once | ORAL | Status: AC
  Administered 2014-01-17: 2 via ORAL
  Filled 2014-01-17: qty 2

## 2014-01-17 NOTE — ED Notes (Signed)
PA made aware of pulse rate of 111. Okay to send home.

## 2014-01-17 NOTE — ED Provider Notes (Signed)
CSN: [id number]     Arrival date & time 01/17/14  1242 History   First MD Initiated Contact with Patient 01/17/14 1252    This chart was scribed for Alvina Chou PA-C, a non-physician practitioner working with Mervin Kung, MD by Denice Bors, ED Scribe. This patient was seen in room TR06C/TR06C and the patient's care was started at 1:31 PM      Chief Complaint  Patient presents with  . Knee Pain  . Cough   (Consider location/radiation/quality/duration/timing/severity/associated sxs/prior Treatment) The history is provided by the patient. No language interpreter was used.   HPI Comments: Theodore Wood is a 63 y.o. male who presents to the Emergency Department complaining of constant unchanged right knee pain onset 1 month. Describes pain as severe. States he stands most of the day when he works. Reports he has follow up appointment, "but was unable to make it." Reports pain is exacerbated by touch, movement, and weight bearing. Denies associated recent trauma, fall, and numbness.   Additionally, reports associated persistent dry cough onset few days. Denies any aggravating or alleviating factors. Denies associated fever.   Past Medical History  Diagnosis Date  . Hypertension   . Angina   . GERD (gastroesophageal reflux disease)   . High cholesterol    Past Surgical History  Procedure Laterality Date  . Cardiac catheterization  1990's   No family history on file. History  Substance Use Topics  . Smoking status: Former Smoker -- 1.00 packs/day for 20 years    Types: Cigarettes    Quit date: 02/02/2002  . Smokeless tobacco: Never Used  . Alcohol Use: 25.2 oz/week    42 Cans of beer per week    Review of Systems  Constitutional: Negative for fever.  Respiratory: Positive for cough.   Musculoskeletal: Positive for arthralgias.  Psychiatric/Behavioral: Negative for confusion.    Allergies  Review of patient's allergies indicates no known  allergies.  Home Medications   Current Outpatient Rx  Name  Route  Sig  Dispense  Refill  . fish oil-omega-3 fatty acids 1000 MG capsule   Oral   Take 1 g by mouth 2 (two) times daily.         . flunisolide (NASALIDE) 0.025 % SOLN   Nasal   Place 1 spray into the nose daily.         . hydrochlorothiazide (HYDRODIURIL) 25 MG tablet   Oral   Take 25 mg by mouth daily.         Marland Kitchen HYDROcodone-acetaminophen (NORCO/VICODIN) 5-325 MG per tablet   Oral   Take 1 tablet by mouth every 4 (four) hours as needed.   10 tablet   0    BP 133/88  Pulse 111  Temp(Src) 98.5 F (36.9 C) (Oral)  Resp 18  Ht 5\' 7"  (1.702 m)  Wt 170 lb (77.111 kg)  BMI 26.62 kg/m2  SpO2 100% Physical Exam  Nursing note and vitals reviewed. Constitutional: He is oriented to person, place, and time. He appears well-developed and well-nourished. No distress.  HENT:  Head: Normocephalic and atraumatic.  Eyes: EOM are normal.  Neck: Neck supple. No tracheal deviation present.  Cardiovascular: Normal rate.   Pulmonary/Chest: Effort normal. No respiratory distress.  Musculoskeletal:  Right knee ROM limited due to pain. Generalized edema noted of right knee. Generalized anterior tenderness to palpation. No obvious deformity.   Neurological: He is alert and oriented to person, place, and time.  Skin: Skin is warm and dry.  Psychiatric: He has a normal mood and affect. His behavior is normal.    ED Course  Procedures COORDINATION OF CARE:  Nursing notes reviewed. Vital signs reviewed. Initial pt interview and examination performed.   1:11 PM-Discussed work up plan with pt at bedside, which includes follow up with Dr. Percell Miller . Pt agrees with plan.   Treatment plan initiated:Medications - No data to display   Initial diagnostic testing ordered.     Labs Review Labs Reviewed - No data to display Imaging Review Dg Knee Complete 4 Views Right  01/17/2014   CLINICAL DATA:  Right knee pain for 2  months.  EXAM: RIGHT KNEE - COMPLETE 4+ VIEW  COMPARISON:  12/25/2013.  FINDINGS: No evidence for fracture or dislocation. No worrisome lytic or sclerotic osseous abnormality. Persistent joint effusion noted although decreased in the interval. 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  IMPRESSION: Stable.  Joint effusion without acute bony findings.   Electronically Signed   By: Misty Stanley M.D.   On: 01/17/2014 14:00    EKG Interpretation   None       MDM   1. Knee effusion, right    2:58 PM I attempted to aspirate the knee joint which was unsuccessful. Knee xray shows effusion. Patient given percocet for pain. Patient advised to follow up with Dr. Percell Miller for further evaluation. No injury.   I personally performed the services described in this documentation, which was scribed in my presence. The recorded information has been reviewed and is accurate.    Alvina Chou, PA-C 01/17/14 1500

## 2014-01-17 NOTE — ED Notes (Signed)
Pt reports right knee pain x 1 month, had f/u appt but unable to make it. Cough x few days, no fever.

## 2014-01-17 NOTE — Discharge Instructions (Signed)
Take Percocet as needed for pain. Follow up with Dr. Percell Miller for further evaluation of your knee pain. Refer to attached documents for more information.

## 2014-01-19 NOTE — ED Provider Notes (Signed)
Medical screening examination/treatment/procedure(s) were performed by non-physician practitioner and as supervising physician I was immediately available for consultation/collaboration.  EKG Interpretation   None         Mervin Kung, MD 01/19/14 [id number]

## 2014-08-28 ENCOUNTER — Emergency Department (HOSPITAL_COMMUNITY)
Admission: EM | Admit: 2014-08-28 | Discharge: 2014-08-28 | Disposition: A | Discharge status: Home / Self Care | Payer: No Typology Code available for payment source | Attending: Emergency Medicine | Admitting: Emergency Medicine

## 2014-08-28 ENCOUNTER — Emergency Department (HOSPITAL_COMMUNITY): Payer: No Typology Code available for payment source

## 2014-08-28 ENCOUNTER — Encounter (HOSPITAL_COMMUNITY): Payer: Self-pay | Admitting: Emergency Medicine

## 2014-08-28 DIAGNOSIS — M79672 Pain in left foot: Secondary | ICD-10-CM

## 2014-08-28 DIAGNOSIS — M19072 Primary osteoarthritis, left ankle and foot: Secondary | ICD-10-CM

## 2014-08-28 DIAGNOSIS — I10 Essential (primary) hypertension: Secondary | ICD-10-CM | POA: Insufficient documentation

## 2014-08-28 DIAGNOSIS — M79609 Pain in unspecified limb: Secondary | ICD-10-CM | POA: Insufficient documentation

## 2014-08-28 DIAGNOSIS — Z87891 Personal history of nicotine dependence: Secondary | ICD-10-CM | POA: Diagnosis not present

## 2014-08-28 DIAGNOSIS — Z9889 Other specified postprocedural states: Secondary | ICD-10-CM | POA: Diagnosis not present

## 2014-08-28 DIAGNOSIS — Z8719 Personal history of other diseases of the digestive system: Secondary | ICD-10-CM | POA: Insufficient documentation

## 2014-08-28 DIAGNOSIS — M79671 Pain in right foot: Secondary | ICD-10-CM

## 2014-08-28 DIAGNOSIS — E7800 Pure hypercholesterolemia, unspecified: Secondary | ICD-10-CM | POA: Insufficient documentation

## 2014-08-28 DIAGNOSIS — Z79899 Other long term (current) drug therapy: Secondary | ICD-10-CM | POA: Insufficient documentation

## 2014-08-28 DIAGNOSIS — M19079 Primary osteoarthritis, unspecified ankle and foot: Secondary | ICD-10-CM | POA: Insufficient documentation

## 2014-08-28 MED ORDER — TRAMADOL HCL 50 MG PO TABS: 50.0000 mg | ORAL_TABLET | Freq: Four times a day (QID) | ORAL | Status: DC | PRN

## 2014-08-28 MED ORDER — MELOXICAM 7.5 MG PO TABS: 7.5000 mg | ORAL_TABLET | Freq: Every day | ORAL | Status: DC

## 2014-08-28 MED ORDER — OXYCODONE-ACETAMINOPHEN 5-325 MG PO TABS
2.0000 | ORAL_TABLET | Freq: Once | ORAL | Status: AC
  Administered 2014-08-28: 2 via ORAL
  Filled 2014-08-28: qty 2

## 2014-08-28 MED ORDER — IBUPROFEN 400 MG PO TABS
600.0000 mg | ORAL_TABLET | Freq: Once | ORAL | Status: DC
  Filled 2014-08-28 (×2): qty 1

## 2014-08-28 NOTE — ED Provider Notes (Signed)
CSN: [id number]     Arrival date & time 08/28/14  0750 History   First MD Initiated Contact with Patient 08/28/14 0754     Chief Complaint  Patient presents with  . Foot Pain     (Consider location/radiation/quality/duration/timing/severity/associated sxs/prior Treatment) HPI Pt is a 63yo male with hx of HTN, GERD, angina, and high cholesterol presenting to ED with c/o bilateral foot pain associated with intermittent edema x5 days.  Pt states he has tried ibuprofen and has been soaking his feet in epson salt with minimal relief.  States pain resolves for 2-3 hours but then returns.  Pain is mainly around ankles and right posterior heel.  Pain is aching, nagging, constant, worse with ambulation.  He has been using crutches to help with ambulation.  States his left little toe hurts and is difficult to move, reports sharply turning his left foot but denies falls or specific new injuries. 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 Denies hx of grout. Denies fever, chills, n/v/d.  Denies hx of diabetes.     Past Medical History  Diagnosis Date  . Hypertension   . Angina   . GERD (gastroesophageal reflux disease)   . High cholesterol    Past Surgical History  Procedure Laterality Date  . Cardiac catheterization  1990's   No family history on file. History  Substance Use Topics  . Smoking status: Former Smoker -- 1.00 packs/day for 20 years    Types: Cigarettes    Quit date: 02/02/2002  . Smokeless tobacco: Never Used  . Alcohol Use: 25.2 oz/week    42 Cans of beer per week     Comment: 4 X  a week "a couple of beers"    Review of Systems  Constitutional: Negative for fever and chills.  Respiratory: Negative for cough and shortness of breath.   Cardiovascular: Negative for chest pain and palpitations.  Gastrointestinal: Negative for nausea, vomiting, abdominal pain and diarrhea.  Genitourinary: Negative for  dysuria, urgency, hematuria and flank pain.  Musculoskeletal: Positive for arthralgias, joint swelling ( left foot ) and myalgias.       Bilateral ankles and feet  Skin: Negative for color change and wound.  All other systems reviewed and are negative.     Allergies  Review of patient's allergies indicates no known allergies.  Home Medications   Prior to Admission medications   Medication Sig Start Date End Date Taking? Authorizing Provider  fish oil-omega-3 fatty acids 1000 MG capsule Take 1 g by mouth daily.    Yes Historical Provider, MD  flunisolide (NASALIDE) 0.025 % SOLN Place 1 spray into the nose daily as needed (for allergies).    Yes Historical Provider, MD  ibuprofen (ADVIL,MOTRIN) 200 MG tablet Take 400 mg by mouth every 4 (four) hours as needed for fever, headache or mild pain.   Yes Historical Provider, MD  lisinopril-hydrochlorothiazide (PRINZIDE,ZESTORETIC) 10-12.5 MG per tablet Take 1 tablet by mouth daily.   Yes Historical Provider, MD  meloxicam (MOBIC) 7.5 MG tablet Take 1 tablet (7.5 mg total) by mouth daily. Take daily for 5 days, then daily as needed for pain. 08/28/14   Noland Fordyce, PA-C  traMADol (ULTRAM) 50 MG tablet Take 1 tablet (50 mg total) by mouth every 6 (six) hours as needed. 08/28/14   Noland Fordyce, PA-C   BP 159/94  Pulse 81  Temp(Src) 98.3 F (36.8 C) (Oral)  Resp  16  Ht 5\' 6"  (1.676 m)  Wt 178 lb (80.74 kg)  BMI 28.74 kg/m2  SpO2 93% Physical Exam  Nursing note and vitals reviewed. Constitutional: He is oriented to person, place, and time. He appears well-developed and well-nourished.  Pt lying comfortably in exam bed, crutches at bedside  HENT:  Head: Normocephalic and atraumatic.  Eyes: EOM are normal.  Neck: Normal range of motion.  Cardiovascular: Normal rate.   Pulmonary/Chest: Effort normal.  Musculoskeletal: Normal range of motion. He exhibits edema ( moderate edema left foot and right lateral ankle) and tenderness.  Right foot:  moderate edema to lateral ankle, decreased dorsiflexion due to pain. Tender to palpation along lateral malleolus and heel   Left foot: moderate edema, decreased ROM little toe, tender to palpation along 5th metatarsal  Neurological: He is alert and oriented to person, place, and time.  Skin: Skin is warm and dry. No erythema.  Bilateral feet: skin in tact, diffuse dried skin. No erythema, ecchymosis or warmth.   Psychiatric: He has a normal mood and affect. His behavior is normal.    ED Course  Procedures (including critical care time) Labs Review Labs Reviewed - No data to display  Imaging Review Dg Ankle Complete Right  08/28/2014   CLINICAL DATA:  Diffuse pain, no known injury  EXAM: RIGHT ANKLE - COMPLETE 3+ VIEW  COMPARISON:  None.  FINDINGS: Three views of the right ankle submitted. No acute fracture or subluxation. Ankle mortise is preserved. No radiopaque foreign body.  IMPRESSION: Negative.   Electronically Signed   By: Lahoma Crocker M.D.   On: 08/28/2014 08:54   Dg Foot Complete Left  08/28/2014   CLINICAL DATA:  Entire foot pain, no known injury  EXAM: LEFT FOOT - COMPLETE 3+ VIEW  COMPARISON:  None.  FINDINGS: Three views of the left foot submitted. No acute fracture or subluxation. Mild erosive degenerative changes noted distal aspect of first and fifth metatarsal. There is widening of joint space with adjacent osteolysis distal interphalangeal joint fifth toe. Although this may be degenerative in nature joint infection cannot be excluded. Clinical correlation is necessary.  IMPRESSION: No acute fracture or subluxation. Mild erosive degenerative changes noted distal aspect of first and fifth metatarsal. There is widening of joint space with adjacent osteolysis distal interphalangeal joint fifth toe. Although this may be degenerative in nature joint infection cannot be excluded. Clinical correlation is necessary.   Electronically Signed   By: Lahoma Crocker M.D.   On: 08/28/2014 08:53      EKG Interpretation None      MDM   Final diagnoses:  Bilateral foot pain  Osteoarthritis of left foot, unspecified osteoarthritis type    pt c/o bilateral foot pain. States he f/u with podiatrist at the [place], appointment is 09/18/14.  Denies new falls or injuries. Feet are dried, swollen and tender but no evidence of underlying infection. Plain films significant for mild erosis degenerative changes in distal aspect of 1st and 5th metatarsal, consistent with OA.  Will discharge pt home with tramadol and mobic. Strongly encouraged him to f/u as scheduled with his podiatrist. Return precautions provided. Pt verbalized understanding and agreement with tx plan.     Noland Fordyce, PA-C 08/28/14 1022

## 2014-08-28 NOTE — ED Notes (Signed)
Pt returned to radiology.

## 2014-08-28 NOTE — ED Notes (Signed)
Pt c/o bilateral foot pain x 5 days, sts he has tried some OTC meds and home remedies but only gets relief for about an hour then the pain comes back. Pt sts the pain is mainly located around ankles and posterior heal. Pt sts he has been using crutches to walk. Nad, skin warm and dry, resp e/u.

## 2014-08-29 NOTE — ED Provider Notes (Signed)
Medical screening examination/treatment/procedure(s) were performed by non-physician practitioner and as supervising physician I was immediately available for consultation/collaboration.   EKG Interpretation None       Theodore Wood. Alvino Chapel, MD 08/29/14 2358

## 2015-08-02 ENCOUNTER — Emergency Department (HOSPITAL_COMMUNITY): Payer: Non-veteran care

## 2015-08-02 ENCOUNTER — Encounter (HOSPITAL_COMMUNITY): Payer: Self-pay | Admitting: Vascular Surgery

## 2015-08-02 ENCOUNTER — Emergency Department (HOSPITAL_COMMUNITY)
Admission: EM | Admit: 2015-08-02 | Discharge: 2015-08-02 | Disposition: A | Discharge status: Home / Self Care | Payer: Non-veteran care | Attending: Emergency Medicine | Admitting: Emergency Medicine

## 2015-08-02 DIAGNOSIS — Z79899 Other long term (current) drug therapy: Secondary | ICD-10-CM | POA: Insufficient documentation

## 2015-08-02 DIAGNOSIS — Y9389 Activity, other specified: Secondary | ICD-10-CM | POA: Diagnosis not present

## 2015-08-02 DIAGNOSIS — I10 Essential (primary) hypertension: Secondary | ICD-10-CM | POA: Insufficient documentation

## 2015-08-02 DIAGNOSIS — I209 Angina pectoris, unspecified: Secondary | ICD-10-CM | POA: Insufficient documentation

## 2015-08-02 DIAGNOSIS — Z8639 Personal history of other endocrine, nutritional and metabolic disease: Secondary | ICD-10-CM | POA: Insufficient documentation

## 2015-08-02 DIAGNOSIS — M7031 Other bursitis of elbow, right elbow: Secondary | ICD-10-CM | POA: Diagnosis not present

## 2015-08-02 DIAGNOSIS — Z87891 Personal history of nicotine dependence: Secondary | ICD-10-CM | POA: Diagnosis not present

## 2015-08-02 DIAGNOSIS — Z8719 Personal history of other diseases of the digestive system: Secondary | ICD-10-CM | POA: Insufficient documentation

## 2015-08-02 DIAGNOSIS — M7021 Olecranon bursitis, right elbow: Secondary | ICD-10-CM

## 2015-08-02 DIAGNOSIS — M25521 Pain in right elbow: Secondary | ICD-10-CM | POA: Diagnosis present

## 2015-08-02 MED ORDER — IBUPROFEN 800 MG PO TABS: 800.0000 mg | ORAL_TABLET | Freq: Three times a day (TID) | ORAL | Status: DC

## 2015-08-02 NOTE — ED Provider Notes (Signed)
CSN: [id number]     Arrival date & time 08/02/15  1252 History   First MD Initiated Contact with Patient 08/02/15 1325     Chief Complaint  Patient presents with  . Elbow Pain     HPI   Theodore Wood is a 64 y.o. male with a PMH of HTN who presents to the ED with right elbow pain and swelling. He reports he tripped and fell on his elbow three weeks ago, and since that time has had pain and increased swelling to that area. He states his right elbow feels like it is "throbbing." He denies aggravating factors. He reports he has tried ice and ibuprofen for symptom relief. He denies fever, chills, numbness, paresthesia, weakness, decreased range of motion.   Past Medical History  Diagnosis Date  . Hypertension   . Angina   . GERD (gastroesophageal reflux disease)   . High cholesterol    Past Surgical History  Procedure Laterality Date  . Cardiac catheterization  1990's   No family history on file. Social History  Substance Use Topics  . Smoking status: Former Smoker -- 1.00 packs/day for 20 years    Types: Cigarettes    Quit date: 02/02/2002  . Smokeless tobacco: Never Used  . Alcohol Use: 25.2 oz/week    42 Cans of beer per week     Comment: 4 X  a week "a couple of beers"    Review of Systems  Constitutional: Negative for fever, chills, activity change and appetite change.  Respiratory: Negative for shortness of breath.   Cardiovascular: Negative for chest pain.  Gastrointestinal: Negative for nausea, vomiting, abdominal pain and diarrhea.  Musculoskeletal: Positive for arthralgias.       Reports right elbow pain.  Skin: Negative for color change, pallor, rash and wound.  Neurological: Negative for dizziness, weakness, light-headedness, numbness and headaches.      Allergies  Review of patient's allergies indicates no known allergies.  Home Medications   Prior to Admission medications   Medication Sig Start Date End Date Taking? Authorizing Provider  fish  oil-omega-3 fatty acids 1000 MG capsule Take 1 g by mouth daily.     Historical Provider, MD  flunisolide (NASALIDE) 0.025 % SOLN Place 1 spray into the nose daily as needed (for allergies).     Historical Provider, MD  ibuprofen (ADVIL,MOTRIN) 200 MG tablet Take 400 mg by mouth every 4 (four) hours as needed for fever, headache or mild pain.    Historical Provider, MD  lisinopril-hydrochlorothiazide (PRINZIDE,ZESTORETIC) 10-12.5 MG per tablet Take 1 tablet by mouth daily.    Historical Provider, MD  meloxicam (MOBIC) 7.5 MG tablet Take 1 tablet (7.5 mg total) by mouth daily. Take daily for 5 days, then daily as needed for pain. 08/28/14   Noland Fordyce, PA-C  traMADol (ULTRAM) 50 MG tablet Take 1 tablet (50 mg total) by mouth every 6 (six) hours as needed. 08/28/14   Noland Fordyce, PA-C     BP 133/79 mmHg  Pulse 88  Temp(Src) 98.4 F (36.9 C) (Oral)  Resp 16  SpO2 97% Physical Exam  Constitutional: He is oriented to person, place, and time. He appears well-developed and well-nourished. No distress.  HENT:  Head: Normocephalic and atraumatic.  Right Ear: External ear normal.  Left Ear: External ear normal.  Nose: Nose normal.  Mouth/Throat: Oropharynx is clear and moist.  Eyes: Conjunctivae and EOM are normal. Pupils are equal, round, and reactive to light.  Neck: Normal range of motion.  Neck supple.  Cardiovascular: Normal rate, regular rhythm, normal heart sounds and intact distal pulses.   Pulmonary/Chest: Effort normal and breath sounds normal. No respiratory distress. He has no wheezes. He has no rales.  Abdominal: Soft. He exhibits no distension. There is no tenderness.  Musculoskeletal: Normal range of motion. He exhibits no tenderness.       Right elbow: He exhibits swelling. He exhibits normal range of motion. No tenderness found.  Tennis ball sized fluctuant mass overlying right olecranon   Neurological: He is alert and oriented to person, place, and time.  Skin: Skin is warm,  dry and intact. No rash noted. He is not diaphoretic. No erythema. No pallor.  Nursing note and vitals reviewed.   ED Course  Procedures (including critical care time)  Labs Review Labs Reviewed - No data to display  Imaging Review Dg Elbow Complete Right  08/02/2015   CLINICAL DATA:  Right elbow pain and swelling, fall 3 weeks ago  EXAM: RIGHT ELBOW - COMPLETE 3+ VIEW  COMPARISON:  None.  FINDINGS: Four views of the right elbow submitted. No acute fracture or subluxation. Significant soft tissue swelling noted dorsal olecranon region. Olecranon bursitis or hematoma cannot be excluded.  IMPRESSION: No acute fracture or subluxation. Significant soft tissue swelling noted dorsal olecranon region. Olecranon bursitis or hematoma cannot be excluded.   Electronically Signed   By: Lahoma Crocker M.D.   On: 08/02/2015 13:56     I have personally reviewed and evaluated these images and lab results as part of my medical decision-making.   EKG Interpretation None      MDM   Final diagnoses:  Bursitis of elbow, right    64 year old male presents with pain and swelling to right elbow s/p mechanical fall x 3 weeks. Patient reports he has a "throbbing" sensation to his right elbow. Denies fever, chills, numbness, paresthesia, weakness, decreased range of motion.  Patient is afebrile. Fucutuant mass overlying right olecranon on exam. No heat, erythema, or tenderness to palpation of right elbow. Full range of motion of right elbow. No evidence for joint involvement or septic bursitis. Doubt infection. Strength and sensation intact. Distal pulses intact.   Imaging of right elbow obtained; x-ray demonstrates significant soft tissue swelling noted to the dorsal olecranon region.   Suspect post-inflammatory olecranon bursitis. Will treat with ibuprofen, ice, and ace-wrap. Patient to follow up with orthopedics for further evaluation and management. Return precautions discussed.  BP 134/78 mmHg  Pulse 72   Temp(Src) 98.4 F (36.9 C) (Oral)  Resp 16  SpO2 98%      Marella Chimes, PA-C 08/02/15 [place], MD 08/03/15 [phone]

## 2015-08-02 NOTE — ED Notes (Signed)
Pt reports to the ED for eval of right elbow pain. He reports he fell on it 3 weeks ago and developed some swelling. He has been icing it but it is not helping. He reports that recently the pain and swelling have been getting worse. Tennis ball sized swelling noted to right elbow. CMS and full ROM intact. No erythema noted. Pt A&Ox4, resp e/u, and skin warm and dry.

## 2015-08-02 NOTE — Discharge Instructions (Signed)
1. Medications: ibuprofen, usual home medications 2. Treatment: rest, drink plenty of fluids, ice, ace wrap 3. Follow Up: please followup with orthopedics for discussion of your diagnoses and further evaluation after today's visit; please return to the ER for severe pain, redness, heat, new or worsening symptoms    Bursitis Bursitis is a swelling and soreness (inflammation) of a fluid-filled sac (bursa) that overlies and protects a joint. It can be caused by injury, overuse of the joint, arthritis or infection. The joints most likely to be affected are the elbows, shoulders, hips and knees. HOME CARE INSTRUCTIONS   Apply ice to the affected area for 15-20 minutes each hour while awake for 2 days. Put the ice in a plastic bag and place a towel between the bag of ice and your skin.  Rest the injured joint as much as possible, but continue to put the joint through a full range of motion, 4 times per day. (The shoulder joint especially becomes rapidly "frozen" if not used.) When the pain lessens, begin normal slow movements and usual activities.  Only take over-the-counter or prescription medicines for pain, discomfort or fever as directed by your caregiver.  Your caregiver may recommend draining the bursa and injecting medicine into the bursa. This may help the healing process.  Follow all instructions for follow-up with your caregiver. This includes any orthopedic referrals, physical therapy and rehabilitation. Any delay in obtaining necessary care could result in a delay or failure of the bursitis to heal and chronic pain. SEEK IMMEDIATE MEDICAL CARE IF:   Your pain increases even during treatment.  You develop an oral temperature above 102 F (38.9 C) and have heat and inflammation over the involved bursa. MAKE SURE YOU:   Understand these instructions.  Will watch your condition.  Will get help right away if you are not doing well or get worse. Document Released: 11/27/2000 Document  Revised: 02/22/2012 Document Reviewed: 02/19/2014 [facility] Patient Information 2015 [place], [place]. This information is not intended to replace advice given to you by your health care provider. Make sure you discuss any questions you have with your health care provider.

## 2016-01-11 ENCOUNTER — Emergency Department (HOSPITAL_COMMUNITY): Payer: Non-veteran care

## 2016-01-11 ENCOUNTER — Encounter (HOSPITAL_COMMUNITY): Payer: Self-pay | Admitting: Emergency Medicine

## 2016-01-11 ENCOUNTER — Other Ambulatory Visit: Payer: Self-pay

## 2016-01-11 ENCOUNTER — Observation Stay (HOSPITAL_COMMUNITY)
Admission: EM | Admit: 2016-01-11 | Discharge: 2016-01-14 | Disposition: A | Discharge status: Home / Self Care | Payer: Non-veteran care | Attending: Oncology | Admitting: Oncology

## 2016-01-11 DIAGNOSIS — F1010 Alcohol abuse, uncomplicated: Secondary | ICD-10-CM | POA: Diagnosis present

## 2016-01-11 DIAGNOSIS — N62 Hypertrophy of breast: Secondary | ICD-10-CM | POA: Diagnosis present

## 2016-01-11 DIAGNOSIS — R740 Nonspecific elevation of levels of transaminase and lactic acid dehydrogenase [LDH]: Secondary | ICD-10-CM | POA: Diagnosis not present

## 2016-01-11 DIAGNOSIS — G720 Drug-induced myopathy: Secondary | ICD-10-CM | POA: Diagnosis present

## 2016-01-11 DIAGNOSIS — T466X5A Adverse effect of antihyperlipidemic and antiarteriosclerotic drugs, initial encounter: Secondary | ICD-10-CM

## 2016-01-11 DIAGNOSIS — Z720 Tobacco use: Secondary | ICD-10-CM | POA: Insufficient documentation

## 2016-01-11 DIAGNOSIS — K2970 Gastritis, unspecified, without bleeding: Secondary | ICD-10-CM

## 2016-01-11 DIAGNOSIS — N179 Acute kidney failure, unspecified: Secondary | ICD-10-CM | POA: Diagnosis present

## 2016-01-11 DIAGNOSIS — Z79899 Other long term (current) drug therapy: Secondary | ICD-10-CM | POA: Diagnosis not present

## 2016-01-11 DIAGNOSIS — I200 Unstable angina: Secondary | ICD-10-CM | POA: Diagnosis not present

## 2016-01-11 DIAGNOSIS — K219 Gastro-esophageal reflux disease without esophagitis: Secondary | ICD-10-CM | POA: Insufficient documentation

## 2016-01-11 DIAGNOSIS — I208 Other forms of angina pectoris: Secondary | ICD-10-CM

## 2016-01-11 DIAGNOSIS — R079 Chest pain, unspecified: Secondary | ICD-10-CM | POA: Diagnosis present

## 2016-01-11 DIAGNOSIS — E785 Hyperlipidemia, unspecified: Secondary | ICD-10-CM | POA: Diagnosis present

## 2016-01-11 DIAGNOSIS — R9431 Abnormal electrocardiogram [ECG] [EKG]: Secondary | ICD-10-CM | POA: Diagnosis not present

## 2016-01-11 DIAGNOSIS — R9430 Abnormal result of cardiovascular function study, unspecified: Secondary | ICD-10-CM | POA: Diagnosis not present

## 2016-01-11 DIAGNOSIS — Z791 Long term (current) use of non-steroidal anti-inflammatories (NSAID): Secondary | ICD-10-CM | POA: Insufficient documentation

## 2016-01-11 DIAGNOSIS — R0789 Other chest pain: Secondary | ICD-10-CM | POA: Insufficient documentation

## 2016-01-11 DIAGNOSIS — E7800 Pure hypercholesterolemia, unspecified: Secondary | ICD-10-CM | POA: Insufficient documentation

## 2016-01-11 DIAGNOSIS — R1013 Epigastric pain: Secondary | ICD-10-CM | POA: Diagnosis not present

## 2016-01-11 DIAGNOSIS — Z87891 Personal history of nicotine dependence: Secondary | ICD-10-CM | POA: Insufficient documentation

## 2016-01-11 DIAGNOSIS — I10 Essential (primary) hypertension: Secondary | ICD-10-CM | POA: Insufficient documentation

## 2016-01-11 DIAGNOSIS — F1020 Alcohol dependence, uncomplicated: Secondary | ICD-10-CM | POA: Diagnosis not present

## 2016-01-11 DIAGNOSIS — I209 Angina pectoris, unspecified: Secondary | ICD-10-CM | POA: Diagnosis not present

## 2016-01-11 LAB — RAPID URINE DRUG SCREEN, HOSP PERFORMED
Amphetamines: NOT DETECTED
BARBITURATES: NOT DETECTED
Benzodiazepines: NOT DETECTED
Cocaine: NOT DETECTED
Opiates: NOT DETECTED
Tetrahydrocannabinol: NOT DETECTED

## 2016-01-11 LAB — BASIC METABOLIC PANEL
Anion gap: 17 — ABNORMAL HIGH (ref 5–15)
BUN: 13 mg/dL (ref 6–20)
CALCIUM: 9.7 mg/dL (ref 8.9–10.3)
CHLORIDE: 97 mmol/L — AB (ref 101–111)
CO2: 25 mmol/L (ref 22–32)
Creatinine, Ser: 1.6 mg/dL — ABNORMAL HIGH (ref 0.61–1.24)
GFR calc non Af Amer: 44 mL/min — ABNORMAL LOW (ref >60)
GFR, EST AFRICAN AMERICAN: 51 mL/min — AB (ref >60)
Glucose, Bld: 99 mg/dL (ref 65–99)
POTASSIUM: 3.6 mmol/L (ref 3.5–5.1)
Sodium: 139 mmol/L (ref 135–145)

## 2016-01-11 LAB — CBC
HCT: 44.6 % (ref 39.0–52.0)
Hemoglobin: 15.3 g/dL (ref 13.0–17.0)
MCH: 31.2 pg (ref 26.0–34.0)
MCHC: 34.3 g/dL (ref 30.0–36.0)
MCV: 90.8 fL (ref 78.0–100.0)
Platelets: 216 10*3/uL (ref 150–400)
RBC: 4.91 MIL/uL (ref 4.22–5.81)
RDW: 13.4 % (ref 11.5–15.5)
WBC: 6 10*3/uL (ref 4.0–10.5)

## 2016-01-11 LAB — URINALYSIS, ROUTINE W REFLEX MICROSCOPIC
Bilirubin Urine: NEGATIVE
GLUCOSE, UA: NEGATIVE mg/dL
Hgb urine dipstick: NEGATIVE
Ketones, ur: NEGATIVE mg/dL
Leukocytes, UA: NEGATIVE
Nitrite: NEGATIVE
PROTEIN: 30 mg/dL — AB
SPECIFIC GRAVITY, URINE: 1.019 (ref 1.005–1.030)
pH: 5 (ref 5.0–8.0)

## 2016-01-11 LAB — HEPATIC FUNCTION PANEL
ALT: 117 U/L — ABNORMAL HIGH (ref 17–63)
AST: 184 U/L — ABNORMAL HIGH (ref 15–41)
Albumin: 4.6 g/dL (ref 3.5–5.0)
Alkaline Phosphatase: 101 U/L (ref 38–126)
Bilirubin, Direct: <0.1 mg/dL — ABNORMAL LOW (ref 0.1–0.5)
TOTAL PROTEIN: 8.8 g/dL — AB (ref 6.5–8.1)
Total Bilirubin: 0.6 mg/dL (ref 0.3–1.2)

## 2016-01-11 LAB — LIPID PANEL
CHOL/HDL RATIO: 5.5 ratio
Cholesterol: 301 mg/dL — ABNORMAL HIGH (ref 0–200)
HDL: 55 mg/dL (ref >40)
LDL CALC: 186 mg/dL — AB (ref 0–99)
Triglycerides: 299 mg/dL — ABNORMAL HIGH (ref <150)
VLDL: 60 mg/dL — ABNORMAL HIGH (ref 0–40)

## 2016-01-11 LAB — LIPASE, BLOOD: Lipase: 39 U/L (ref 11–51)

## 2016-01-11 LAB — URINE MICROSCOPIC-ADD ON

## 2016-01-11 LAB — TROPONIN I
TROPONIN I: 0.03 ng/mL (ref <0.031)
Troponin I: 0.04 ng/mL — ABNORMAL HIGH (ref <0.031)

## 2016-01-11 LAB — I-STAT TROPONIN, ED: TROPONIN I, POC: 0.03 ng/mL (ref 0.00–0.08)

## 2016-01-11 LAB — ETHANOL: ALCOHOL ETHYL (B): 212 mg/dL — AB (ref <5)

## 2016-01-11 MED ORDER — DICLOFENAC SODIUM 1 % TD GEL
2.0000 g | Freq: Four times a day (QID) | TRANSDERMAL | Status: DC
  Administered 2016-01-12 – 2016-01-14 (×7): 2 g via TOPICAL
  Filled 2016-01-11: qty 100

## 2016-01-11 MED ORDER — HEPARIN SODIUM (PORCINE) 5000 UNIT/ML IJ SOLN
5000.0000 [IU] | Freq: Three times a day (TID) | INTRAMUSCULAR | Status: DC
  Administered 2016-01-11 – 2016-01-14 (×7): 5000 [IU] via SUBCUTANEOUS
  Filled 2016-01-11 (×7): qty 1

## 2016-01-11 MED ORDER — ONDANSETRON HCL 4 MG/2ML IJ SOLN: 4.0000 mg | Freq: Four times a day (QID) | INTRAMUSCULAR | Status: DC | PRN

## 2016-01-11 MED ORDER — ASPIRIN EC 81 MG PO TBEC
81.0000 mg | DELAYED_RELEASE_TABLET | Freq: Once | ORAL | Status: AC
  Administered 2016-01-11: 81 mg via ORAL
  Filled 2016-01-11: qty 1

## 2016-01-11 MED ORDER — ADULT MULTIVITAMIN W/MINERALS CH
1.0000 | ORAL_TABLET | Freq: Every day | ORAL | Status: DC
  Administered 2016-01-12 – 2016-01-14 (×3): 1 via ORAL
  Filled 2016-01-11 (×3): qty 1

## 2016-01-11 MED ORDER — NITROGLYCERIN 0.4 MG SL SUBL
0.3000 mg | SUBLINGUAL_TABLET | SUBLINGUAL | Status: DC | PRN
  Administered 2016-01-12: 0.4 mg via SUBLINGUAL
  Filled 2016-01-11: qty 1

## 2016-01-11 MED ORDER — LORAZEPAM 2 MG/ML IJ SOLN
1.0000 mg | Freq: Four times a day (QID) | INTRAMUSCULAR | Status: DC | PRN
  Administered 2016-01-11 – 2016-01-13 (×3): 1 mg via INTRAVENOUS
  Filled 2016-01-11 (×3): qty 1

## 2016-01-11 MED ORDER — SODIUM CHLORIDE 0.9 % IV SOLN
INTRAVENOUS | Status: DC
  Administered 2016-01-12: 1000 mL via INTRAVENOUS

## 2016-01-11 MED ORDER — PANTOPRAZOLE SODIUM 40 MG PO TBEC
40.0000 mg | DELAYED_RELEASE_TABLET | Freq: Every day | ORAL | Status: DC
  Administered 2016-01-12 – 2016-01-14 (×3): 40 mg via ORAL
  Filled 2016-01-11 (×3): qty 1

## 2016-01-11 MED ORDER — PANTOPRAZOLE SODIUM 40 MG IV SOLR
40.0000 mg | Freq: Once | INTRAVENOUS | Status: AC
  Administered 2016-01-11: 40 mg via INTRAVENOUS
  Filled 2016-01-11: qty 40

## 2016-01-11 MED ORDER — VITAMIN B-1 100 MG PO TABS
100.0000 mg | ORAL_TABLET | Freq: Every day | ORAL | Status: DC
  Administered 2016-01-12 – 2016-01-14 (×3): 100 mg via ORAL
  Filled 2016-01-11 (×3): qty 1

## 2016-01-11 MED ORDER — ROSUVASTATIN CALCIUM 40 MG PO TABS
40.0000 mg | ORAL_TABLET | Freq: Every day | ORAL | Status: DC
  Administered 2016-01-11 – 2016-01-12 (×2): 40 mg via ORAL
  Filled 2016-01-11: qty 4
  Filled 2016-01-11 (×4): qty 1

## 2016-01-11 MED ORDER — LORAZEPAM 1 MG PO TABS
1.0000 mg | ORAL_TABLET | Freq: Four times a day (QID) | ORAL | Status: DC | PRN
  Administered 2016-01-12: 1 mg via ORAL
  Filled 2016-01-11: qty 1

## 2016-01-11 MED ORDER — SODIUM CHLORIDE 0.9% FLUSH
3.0000 mL | Freq: Two times a day (BID) | INTRAVENOUS | Status: DC
  Administered 2016-01-11 – 2016-01-14 (×5): 3 mL via INTRAVENOUS

## 2016-01-11 MED ORDER — ONDANSETRON HCL 4 MG PO TABS: 4.0000 mg | ORAL_TABLET | Freq: Four times a day (QID) | ORAL | Status: DC | PRN

## 2016-01-11 MED ORDER — THIAMINE HCL 100 MG/ML IJ SOLN
Freq: Once | INTRAVENOUS | Status: AC
  Administered 2016-01-11: 17:00:00 via INTRAVENOUS
  Filled 2016-01-11: qty 1000

## 2016-01-11 MED ORDER — THIAMINE HCL 100 MG/ML IJ SOLN: 100.0000 mg | Freq: Every day | INTRAMUSCULAR | Status: DC

## 2016-01-11 MED ORDER — FOLIC ACID 1 MG PO TABS
1.0000 mg | ORAL_TABLET | Freq: Every day | ORAL | Status: DC
  Administered 2016-01-12 – 2016-01-14 (×3): 1 mg via ORAL
  Filled 2016-01-11 (×3): qty 1

## 2016-01-11 NOTE — ED Notes (Signed)
Attempted report 

## 2016-01-11 NOTE — ED Notes (Signed)
Pt c/o epigastric pain ongoing for 2 weeks since his son came home. Pt reports that son is stressing him out. Pt reports that he has been drinking, last drink was 1 hour PTA. Pt tearful and apologetic in triage.

## 2016-01-11 NOTE — ED Provider Notes (Signed)
CSN: [id number]     Arrival date & time 01/11/16  1217 History   First MD Initiated Contact with Patient 01/11/16 1300     Chief Complaint  Patient presents with  . Chest Pain  . Stress  . Alcohol Intoxication     (Consider location/radiation/quality/duration/timing/severity/associated sxs/prior Treatment) HPI Patient reports he has had waxing and waning chest pain for 2 weeks. He states he gets episodes of pain that are in his epigastrium and radiated into his chest. They come in episodes of sharp to cramping quality. He reports he feels short of breath in association with this. Patient reports he has similar pain a number of years ago when he was having some problems with his heart. He has not had syncopal episode. The patient does drink alcohol regularly. He denies a prior history of known gastric ulcer. He does also report intermittent emesis but not daily. He has not seen any blood in it. Past Medical History  Diagnosis Date  . Hypertension   . Angina   . GERD (gastroesophageal reflux disease)   . High cholesterol    Past Surgical History  Procedure Laterality Date  . Cardiac catheterization  1990's   No family history on file. Social History  Substance Use Topics  . Smoking status: Former Smoker -- 1.00 packs/day for 20 years    Types: Cigarettes    Quit date: 02/02/2002  . Smokeless tobacco: Never Used  . Alcohol Use: 25.2 oz/week    42 Cans of beer per week     Comment: 4 X  a week "a couple of beers"    Review of Systems 10 Systems reviewed and are negative for acute change except as noted in the HPI.    Allergies  Review of patient's allergies indicates no known allergies.  Home Medications   Prior to Admission medications   Medication Sig Start Date End Date Taking? Authorizing Provider  fish oil-omega-3 fatty acids 1000 MG capsule Take 1 g by mouth daily.    Yes Historical Provider, MD  ibuprofen (ADVIL,MOTRIN) 800 MG tablet Take 1 tablet (800 mg total)  by mouth 3 (three) times daily. 08/02/15  Yes Marella Chimes, PA-C  lisinopril-hydrochlorothiazide (PRINZIDE,ZESTORETIC) 10-12.5 MG per tablet Take 1 tablet by mouth daily.   Yes Historical Provider, MD  meloxicam (MOBIC) 7.5 MG tablet Take 1 tablet (7.5 mg total) by mouth daily. Take daily for 5 days, then daily as needed for pain. Patient not taking: Reported on 01/11/2016 08/28/14   Noland Fordyce, PA-C  traMADol (ULTRAM) 50 MG tablet Take 1 tablet (50 mg total) by mouth every 6 (six) hours as needed. Patient not taking: Reported on 01/11/2016 08/28/14   Noland Fordyce, PA-C   BP 135/85 mmHg  Pulse 87  Temp(Src) 99 F (37.2 C)  Resp 19  Ht 5\' 7"  (1.702 m)  Wt 152 lb 8 oz (69.174 kg)  BMI 23.88 kg/m2  SpO2 100% Physical Exam  Constitutional: He is oriented to person, place, and time. He appears well-developed and well-nourished.  HENT:  Head: Normocephalic and atraumatic.  Eyes: EOM are normal. Pupils are equal, round, and reactive to light.  Neck: Neck supple.  Cardiovascular: Normal rate, regular rhythm, normal heart sounds and intact distal pulses.   Pulmonary/Chest: Effort normal and breath sounds normal.  Abdominal: Soft. Bowel sounds are normal. He exhibits no distension. There is tenderness.  Mild to moderate epigastric tenderness without guarding. Lower abdomen is nontender.  Musculoskeletal: Normal range of motion. He exhibits no  edema.  Neurological: He is alert and oriented to person, place, and time. He has normal strength. He exhibits normal muscle tone. Coordination normal. GCS eye subscore is 4. GCS verbal subscore is 5. GCS motor subscore is 6.  Skin: Skin is warm, dry and intact.  Psychiatric: He has a normal mood and affect.    ED Course  Procedures (including critical care time) Labs Review Labs Reviewed  BASIC METABOLIC PANEL - Abnormal; Notable for the following:    Chloride 97 (*)    Creatinine, Ser 1.60 (*)    GFR calc non Af Amer 44 (*)    GFR calc Af  Amer 51 (*)    Anion gap 17 (*)    All other components within normal limits  HEPATIC FUNCTION PANEL - Abnormal; Notable for the following:    Total Protein 8.8 (*)    AST 184 (*)    ALT 117 (*)    Bilirubin, Direct <0.1 (*)    All other components within normal limits  ETHANOL - Abnormal; Notable for the following:    Alcohol, Ethyl (B) 212 (*)    All other components within normal limits  CBC  LIPASE, BLOOD  URINE RAPID DRUG SCREEN, HOSP PERFORMED  URINALYSIS, ROUTINE W REFLEX MICROSCOPIC (NOT AT [facility])  I-STAT TROPOININ, ED    Imaging Review Dg Chest 2 View  01/11/2016  CLINICAL DATA:  Chest pain, shortness of breath, left arm pain for 2 weeks EXAM: CHEST  2 VIEW COMPARISON:  02/03/2012 FINDINGS: Heart and mediastinal contours are within normal limits. No focal opacities or effusions. No acute bony abnormality. IMPRESSION: No active cardiopulmonary disease. Electronically Signed   By: Rolm Baptise M.D.   On: 01/11/2016 13:11   I have personally reviewed and evaluated these images and lab results as part of my medical decision-making.   EKG Interpretation   Date/Time:  Saturday January 11 2016 12:22:52 EST Ventricular Rate:  109 PR Interval:  138 QRS Duration: 80 QT Interval:  338 QTC Calculation: 455 R Axis:   52 Text Interpretation:  Sinus tachycardia ST \\T \ T wave abnormality,  consider inferolateral ischemia Abnormal ECG agree. increased ST  depression Confirmed by Johnney Killian, MD, Jeannie Done [phone]) on 01/11/2016 2:33:57  PM      MDM   Final diagnoses:  Epigastric pain  Abnormal EKG  Alcoholism ([employer])   At this time, patient presents with epigastric pain that has been waxing and waning. EKG does show ST depressions that are more pronounced compared with old EKG. Patient has risk factors of hypertension and hyperlipidemia. Also as a chronic alcoholic with risk for gastritis and peptic ulcer disease. He has not had any active GI bleed that he is aware of and does not report  any emesis of blood. At this time plan will be for admission to rule out for MI and further diagnostic studies if indicated for epigastric pain.    Charlesetta Shanks, MD 01/11/16 1620

## 2016-01-11 NOTE — ED Notes (Signed)
Pt in xray

## 2016-01-11 NOTE — H&P (Signed)
Date: 01/11/2016               Patient Name:  Theodore Wood MRN: [id number]  DOB: 03-20-1951 Age / Sex: 65 y.o., male   PCP: Provider Default, MD         Medical Service: Internal Medicine Teaching Service         Attending Physician: Dr. Charlesetta Shanks, MD    First Contact: Dr. [name] Pager: [phone]  Second Contact: Dr. Albin Felling Pager: [phone]       After Hours (After 5p/  First Contact Pager: [phone]  weekends / holidays): Second Contact Pager: [phone]   Chief Complaint: "I've been having chest pain."  History of Present Illness:  Theodore Wood is a 65 year old man with hypertension, hyperlipidemia, alcohol use, and tobacco use visiting with chest pain.  Two weeks ago, his son moved in with him, and he has been incredibly stressed out. Since that time, he has been having intermittent sharp substernal chest pain that comes and goes randomly, and is not associated with exertion. The pain is relieved by lifting his left arm above his head, and worse when pressing directly over it. He sometimes feels nauseous during these episodes, but denies diaphoresis or syncope. About 10 years ago, he had a cardiac cath, and was told he had something in his arteries, but he did not have a stent placed. This pain feels slightly similar to the pain he experienced of the time.  Over the last 2 weeks, he has significantly increased his drinking. He is now drinking about a fifth per day; he's never had alcohol withdrawals. His last drink was this morning. He's also been taking about 10 pills of ibuprofen a day to help his pain. He denies any melena or hematochezia, but does have epigastric cramping in addition to the above mentioned chest pain.  Meds: Current Facility-Administered Medications  Medication Dose Route Frequency Provider Last Rate Last Dose  . aspirin EC tablet 81 mg  81 mg Oral Once Charlesetta Shanks, MD      . sodium chloride 0.9 % 1,000 mL with thiamine 123XX123 mg, folic acid 1 mg,  multivitamins adult 10 mL infusion   Intravenous Once Charlesetta Shanks, MD       Current Outpatient Prescriptions  Medication Sig Dispense Refill  . fish oil-omega-3 fatty acids 1000 MG capsule Take 1 g by mouth daily.     Marland Kitchen ibuprofen (ADVIL,MOTRIN) 800 MG tablet Take 1 tablet (800 mg total) by mouth 3 (three) times daily. 21 tablet 0  . lisinopril-hydrochlorothiazide (PRINZIDE,ZESTORETIC) 10-12.5 MG per tablet Take 1 tablet by mouth daily.    . meloxicam (MOBIC) 7.5 MG tablet Take 1 tablet (7.5 mg total) by mouth daily. Take daily for 5 days, then daily as needed for pain. (Patient not taking: Reported on 01/11/2016) 30 tablet 0  . traMADol (ULTRAM) 50 MG tablet Take 1 tablet (50 mg total) by mouth every 6 (six) hours as needed. (Patient not taking: Reported on 01/11/2016) 15 tablet 0    Allergies: Allergies as of 01/11/2016  . (No Known Allergies)   Past Medical History  Diagnosis Date  . Hypertension   . Angina   . GERD (gastroesophageal reflux disease)   . High cholesterol    Past Surgical History  Procedure Laterality Date  . Cardiac catheterization  1990's   No family history on file. Social History   Social History  . Marital Status: Widowed    Spouse Name: N/A  .  Number of Children: N/A  . Years of Education: N/A   Occupational History  . Not on file.   Social History Main Topics  . Smoking status: Former Smoker -- 1.00 packs/day for 20 years    Types: Cigarettes    Quit date: 02/02/2002  . Smokeless tobacco: Never Used  . Alcohol Use: 25.2 oz/week    42 Cans of beer per week     Comment: 4 X  a week "a couple of beers"  . Drug Use: Yes    Special: Cocaine, Marijuana, Heroin     Comment: 02/03/12 "3 days ago I did cocaine to get the pain off; used heroin years ago" 08/28/2014 " I stopped using all that"  . Sexual Activity: Yes    Birth Control/ Protection: Condom   Other Topics Concern  . Not on file   Social History Narrative    Review of Systems: Per  HPI  Physical Exam: Blood pressure 135/85, pulse 87, temperature 99 F (37.2 C), resp. rate 19, height 5\' 7"  (1.702 m), weight 69.174 kg (152 lb 8 oz), SpO2 100 %.  General: very nice man resting in bed comfortably, appropriately conversational HEENT: arcus senilis, extra-ocular muscles intact, oropharynx with fissured tongue and poor dentition Cardiac: regular rate and rhythm, no rubs, murmurs or gallops, tender to palpation over his left breast; he has unilateral gynecomastia Pulm: breathing well, clear to auscultation bilaterally Abd: bowel sounds normal, soft, slightly tender in epigastrum Ext: warm and well perfused, without pedal edema Lymph: no cervical or supraclavicular lymphadenopathy Skin: no rash, hair, or nail changes  Neuro: alert and oriented X3, cranial nerves II-XII grossly intact, moving all extremities well  Lab results: Basic Metabolic Panel:  Recent Labs  01/11/16 1239  NA 139  K 3.6  CL 97*  CO2 25  GLUCOSE 99  BUN 13  CREATININE 1.60*  CALCIUM 9.7   Liver Function Tests:  Recent Labs  01/11/16 1239  AST 184*  ALT 117*  ALKPHOS 101  BILITOT 0.6  PROT 8.8*  ALBUMIN 4.6    Recent Labs  01/11/16 1239  LIPASE 39   CBC:  Recent Labs  01/11/16 1239  WBC 6.0  HGB 15.3  HCT 44.6  MCV 90.8  PLT 216   Alcohol Level:  Recent Labs  01/11/16 1239  ETH 212*   Imaging results:  Dg Chest 2 View  01/11/2016  CLINICAL DATA:  Chest pain, shortness of breath, left arm pain for 2 weeks EXAM: CHEST  2 VIEW COMPARISON:  02/03/2012 FINDINGS: Heart and mediastinal contours are within normal limits. No focal opacities or effusions. No acute bony abnormality. IMPRESSION: No active cardiopulmonary disease. Electronically Signed   By: Rolm Baptise M.D.   On: 01/11/2016 13:11   Other results: EKG: Normal sinus rhythm with less than 33mm ST depression in II, III, aVf, with TWI in the same inferior leads and V4, 5, and 6 as well   Assessment & Plan by  Problem:  Atypical angina: His chest pain has atypical features such as being reproducible to palpation and is alleviated when he moves his left arm; however, he has numerous risk factors for acute coronary syndrome, and he has subtle ST depression and new T-wave inversion in the inferior lateral leads compared to an EKG in 2013. His initial troponin was normal, but we will trend these overnight. -Trending troponins -Telemetry overnight -Continue aspirin 81 mg daily -Repeat EKG in the morning -Checking lipid panel, A1c, TSH, HIV antibody, and hepatitis  C antibody  Epigastric pain: Given his increased alcohol use and significant NSAID intake, this sounds most like peptic ulcer disease. He denies any melena, and his hemoglobin is normal at 15. -Started pantoprazole 40 mg twice daily -Holding NSAIDs  Alcohol abuse: He is drinking about a fifth per day for the last 2 weeks, last drink on the morning of 1/28. -CIWA protocol -B1 and B9 vitamins  Acute kidney injury: Creatinine 1.6, above his baseline of less than 1 back in 2013. He appears hypovolemic on exam, so we will rehydrate him. -Continue normal saline at 150 mL per hour  Hypertension: He is normotensive on his home regimen. -Continue lisinopril 10 mg daily -Continue thiazide 12.5 mg daily  Hyperlipidemia: Last LDL was 67 and 2013. He had statin-induced myopathy to atorvastatin. We can consider starting rosuvastatin pending those results. -Ordered lipid panel  Left-sided gynecomastia: I think this is most likely related to his alcohol use, but he doesn't have any other clinical signs of cirrhosis. We will check a testosterone level to be thorough. If abnormally low, we will repeat an check an LH. I don't feel a discrete nodule but unilateral gynecomastia in a male signs concerning for malignancy; I'll recommend an outpatient mammography in my discharge summary. -Ordered testosterone level -Consider outpatient mammography upon  discharge  Transaminitis: Likely from alcohol use.  Dispo: Disposition is deferred at this time, awaiting improvement of current medical problems. Anticipated discharge in approximately 1-2 day(s).   The patient does have a current PCP [facility] hospital) and does need an [facility] hospital follow-up appointment after discharge.  The patient does not know have transportation limitations that hinder transportation to clinic appointments.  Signed: [name], MD 01/11/2016, 4:49 PM

## 2016-01-12 DIAGNOSIS — R9431 Abnormal electrocardiogram [ECG] [EKG]: Secondary | ICD-10-CM | POA: Diagnosis present

## 2016-01-12 DIAGNOSIS — R740 Nonspecific elevation of levels of transaminase and lactic acid dehydrogenase [LDH]: Secondary | ICD-10-CM | POA: Diagnosis not present

## 2016-01-12 DIAGNOSIS — I209 Angina pectoris, unspecified: Secondary | ICD-10-CM | POA: Diagnosis not present

## 2016-01-12 DIAGNOSIS — F1020 Alcohol dependence, uncomplicated: Secondary | ICD-10-CM | POA: Diagnosis not present

## 2016-01-12 DIAGNOSIS — R1013 Epigastric pain: Secondary | ICD-10-CM | POA: Diagnosis not present

## 2016-01-12 DIAGNOSIS — I10 Essential (primary) hypertension: Secondary | ICD-10-CM | POA: Diagnosis not present

## 2016-01-12 LAB — TROPONIN I: Troponin I: 0.03 ng/mL (ref <0.031)

## 2016-01-12 LAB — COMPREHENSIVE METABOLIC PANEL
ALBUMIN: 3.3 g/dL — AB (ref 3.5–5.0)
ALK PHOS: 82 U/L (ref 38–126)
ALT: 65 U/L — ABNORMAL HIGH (ref 17–63)
ANION GAP: 11 (ref 5–15)
AST: 71 U/L — AB (ref 15–41)
BUN: 13 mg/dL (ref 6–20)
CO2: 26 mmol/L (ref 22–32)
Calcium: 8.4 mg/dL — ABNORMAL LOW (ref 8.9–10.3)
Chloride: 100 mmol/L — ABNORMAL LOW (ref 101–111)
Creatinine, Ser: 0.97 mg/dL (ref 0.61–1.24)
GFR calc Af Amer: >60 mL/min (ref >60)
GFR calc non Af Amer: >60 mL/min (ref >60)
GLUCOSE: 89 mg/dL (ref 65–99)
POTASSIUM: 3.6 mmol/L (ref 3.5–5.1)
SODIUM: 137 mmol/L (ref 135–145)
Total Bilirubin: 0.6 mg/dL (ref 0.3–1.2)
Total Protein: 6.5 g/dL (ref 6.5–8.1)

## 2016-01-12 LAB — CBC
HEMATOCRIT: 36.8 % — AB (ref 39.0–52.0)
HEMOGLOBIN: 12.3 g/dL — AB (ref 13.0–17.0)
MCH: 30.4 pg (ref 26.0–34.0)
MCHC: 33.4 g/dL (ref 30.0–36.0)
MCV: 91.1 fL (ref 78.0–100.0)
Platelets: 170 10*3/uL (ref 150–400)
RBC: 4.04 MIL/uL — ABNORMAL LOW (ref 4.22–5.81)
RDW: 13.5 % (ref 11.5–15.5)
WBC: 4.2 10*3/uL (ref 4.0–10.5)

## 2016-01-12 LAB — HCV COMMENT:

## 2016-01-12 LAB — HEPATITIS C ANTIBODY (REFLEX): HCV Ab: <0.1 s/co ratio (ref 0.0–0.9)

## 2016-01-12 LAB — TSH: TSH: 1.476 u[IU]/mL (ref 0.350–4.500)

## 2016-01-12 LAB — HIV ANTIBODY (ROUTINE TESTING W REFLEX): HIV SCREEN 4TH GENERATION: NONREACTIVE

## 2016-01-12 MED ORDER — ACETAMINOPHEN 325 MG PO TABS
650.0000 mg | ORAL_TABLET | Freq: Four times a day (QID) | ORAL | Status: DC | PRN
  Administered 2016-01-12 – 2016-01-13 (×3): 650 mg via ORAL
  Filled 2016-01-12 (×3): qty 2

## 2016-01-12 NOTE — Progress Notes (Signed)
Patient ID: Theodore Wood, male   DOB: 1951-01-21, 65 y.o.   MRN: [id number]   Subjective: Mr. Theodore Wood had some more atypical sharp chest pain this morning that resolved with nitroglycerin. He's still having abdominal cramps. He follows at the [place] and tells [place] he can get an appointment in the next week.  Objective: Vital signs in last 24 hours: Filed Vitals:   01/12/16 0025 01/12/16 0500 01/12/16 0545 01/12/16 0551  BP: 120/56  135/78 127/80  Pulse: 103  93 95  Temp: 98.9 F (37.2 C)  98.3 F (36.8 C)   TempSrc: Oral  Oral   Resp:  16 16   Height:      Weight:   68.8 kg (151 lb 10.8 oz)   SpO2:   100%    Weight change:   Intake/Output Summary (Last 24 hours) at 01/12/16 0908 Last data filed at 01/12/16 0820  Gross per 24 hour  Intake    243 ml  Output    250 ml  Net     -7 ml   Physical exam: General: very nice man resting in bed comfortably, appropriately conversational Cardiac: regular rate and rhythm, no rubs, murmurs or gallops, tender to palpation over his left breast; he has bilateral gynecomastia, left slightly larger than right Pulm: breathing well, clear to auscultation bilaterally Abd: bowel sounds normal, soft, slightly tender in epigastrum Ext: warm and well perfused, without pedal edema  Lab Results: Basic Metabolic Panel:  Recent Labs Lab 01/11/16 1239 01/12/16 0533  NA 139 137  K 3.6 3.6  CL 97* 100*  CO2 25 26  GLUCOSE 99 89  BUN 13 13  CREATININE 1.60* 0.97  CALCIUM 9.7 8.4*   Liver Function Tests:  Recent Labs Lab 01/11/16 1239 01/12/16 0533  AST 184* 71*  ALT 117* 65*  ALKPHOS 101 82  BILITOT 0.6 0.6  PROT 8.8* 6.5  ALBUMIN 4.6 3.3*   CBC:  Recent Labs Lab 01/11/16 1239 01/12/16 0533  WBC 6.0 4.2  HGB 15.3 12.3*  HCT 44.6 36.8*  MCV 90.8 91.1  PLT 216 170   Cardiac Enzymes:  Recent Labs Lab 01/11/16 1720 01/11/16 2219 01/12/16 0533  TROPONINI 0.04* 0.03 0.03   Fasting Lipid Panel:  Recent Labs Lab  01/11/16 1720  CHOL 301*  HDL 55  LDLCALC 186*  TRIG 299*  CHOLHDL 5.5   Thyroid Function Tests:  Recent Labs Lab 01/12/16 0535  TSH 1.476   Medications: I have reviewed the patient's current medications. Scheduled Meds: . diclofenac sodium  2 g Topical QID  . folic acid  1 mg Oral Daily  . heparin  5,000 Units Subcutaneous 3 times per day  . multivitamin with minerals  1 tablet Oral Daily  . pantoprazole  40 mg Oral Daily  . rosuvastatin  40 mg Oral q1800  . sodium chloride flush  3 mL Intravenous Q12H  . thiamine  100 mg Oral Daily   Or  . thiamine  100 mg Intravenous Daily   Continuous Infusions:  PRN Meds:.LORazepam **OR** LORazepam, nitroGLYCERIN, ondansetron **OR** ondansetron (ZOFRAN) IV   Assessment/Plan:  Atypical angina: His chest pain has atypical features such as being reproducible to palpation and is alleviated when he moves his left arm; however, he has numerous risk factors for acute coronary syndrome, and he has subtle ST depression and new T-wave inversion in the inferior lateral leads compared to an EKG in 2013. Troponins were normal overnight. I think this is all related to peptic  ulcer disease but he has significant risk factors so we'll get an inpatient Myoview tomorrow morning. -Myoview tomorrow morning -NPO at mignight -Telemetry overnight -Continue aspirin 81 mg daily -Checking A1c, HIV antibody, and hepatitis C antibody  Epigastric pain: Given his increased alcohol use and significant NSAID intake, this sounds most like peptic ulcer disease. He denies any melena, and his hemoglobin is stable; down from 15 to 12 today but he got fluids overnight. -Tylenol for pain -Started pantoprazole 40 mg twice daily -Holding NSAIDs -Consider outpatient endoscopy  Alcohol abuse: He is drinking about a fifth per day for the last 2 weeks, last drink on the morning of 1/28. He may start withdrawing tonight. -CIWA protocol -B1 and B9 vitamins  Acute kidney  injury: Creatinine back to baseline with fluids. -Stopped fluids  Hypertension: Pressures looks good on this regimen. May need to start beta-blocker depending on Myoview results. -Continue lisinopril 10 mg daily -Continue thiazide 12.5 mg daily  Hyperlipidemia: LDL 180. -Started rosuvastatin 80mg   Bilateral gynecomastia: I think this is most likely related to his alcohol use, but he doesn't have any other clinical signs of cirrhosis. We will check a testosterone level to be thorough. If abnormally low, we will repeat an check an LH. I don't feel a discrete nodule but unilateral gynecomastia in a male signs concerning for malignancy; I'll recommend an outpatient mammography in my discharge summary. -Ordered testosterone level -Consider outpatient mammography upon discharge  Transaminitis: Resolving with fluids; likely from alcohol use.  Dispo: Disposition is deferred at this time, awaiting improvement of current medical problems.  Anticipated discharge in approximately 1 day(s) after his stress test.  The patient does have a current PCP [facility] hospital) and does need an [facility] hospital follow-up appointment after discharge.  The patient does not have transportation limitations that hinder transportation to clinic appointments.  .Services Needed at time of discharge: Y = Yes, Blank = No PT:   OT:   RN:   Equipment:   Other:       Theodore Chance, MD 01/12/2016, 9:08 AM

## 2016-01-12 NOTE — Discharge Summary (Signed)
Name: Theodore Wood MRN: [id number] DOB: 06/20/1951 65 y.o. PCP: Provider Default, MD  Date of Admission: 01/11/2016 12:55 PM Date of Discharge: 01/14/2016  Attending Physician: Annia Belt, MD  This patient left against medical advice.  Discharge Diagnosis: 1. Atypical angina 2. Epigastric pain thought to be NSAID and alcohol-induced gastritis  3. Alcohol abuse 4. Bilateral gynecomastia 5. Acute kidney injury 6. Hyperlipidemia  Discharge Medications:    Medication List    STOP taking these medications        ibuprofen 800 MG tablet  Commonly known as:  ADVIL,MOTRIN     meloxicam 7.5 MG tablet  Commonly known as:  MOBIC     traMADol 50 MG tablet  Commonly known as:  ULTRAM      TAKE these medications        acetaminophen 325 MG tablet  Commonly known as:  TYLENOL  Take 2 tablets (650 mg total) by mouth every 6 (six) hours as needed for moderate pain.     diclofenac sodium 1 % Gel  Commonly known as:  VOLTAREN  Apply 2 g topically 4 (four) times daily.     fish oil-omega-3 fatty acids 1000 MG capsule  Take 1 g by mouth daily.     folic acid 1 MG tablet  Commonly known as:  FOLVITE  Take 1 tablet (1 mg total) by mouth daily.     lisinopril-hydrochlorothiazide 10-12.5 MG tablet  Commonly known as:  PRINZIDE,ZESTORETIC  Take 1 tablet by mouth daily.     multivitamin with minerals Tabs tablet  Take 1 tablet by mouth daily.     nitroGLYCERIN 0.3 MG SL tablet  Commonly known as:  NITROSTAT  Place 1 tablet (0.3 mg total) under the tongue every 5 (five) minutes as needed for chest pain.     pantoprazole 40 MG tablet  Commonly known as:  PROTONIX  Take 1 tablet (40 mg total) by mouth daily.     rosuvastatin 40 MG tablet  Commonly known as:  CRESTOR  Take 1 tablet (40 mg total) by mouth daily at 6 PM.        Disposition and follow-up:   Theodore Wood was discharged from [facility] in Good condition.  At the  hospital follow up visit please address:  1. Follow up Theodore transthoracic echo results  2. If he complains of myalgias, consider checking a CK to evaluate for statin-induced myopathy as he has a history of this in the past with atorvastatin and was started on rosuvastatin this admission  Procedures Performed:  Dg Chest 2 View  01/11/2016  CLINICAL DATA:  Chest pain, shortness of breath, left arm pain for 2 weeks EXAM: CHEST  2 VIEW COMPARISON:  02/03/2012 FINDINGS: Heart and mediastinal contours are within normal limits. No focal opacities or effusions. No acute bony abnormality. IMPRESSION: No active cardiopulmonary disease. Electronically Signed   By: Rolm Baptise M.D.   On: 01/11/2016 13:11   Nm Myocar Multi W/spect W/wall Motion / Ef  01/13/2016   There was no ST segment deviation noted during stress.  T wave inversion was noted during stress in the II, III, aVF, V5, V4 and V6 leads, beginning at 1 minutes of stress, ending at 3 minutes of stress, and returning to baseline after 1-5 mins of recovery.  Defect 1: There is a small defect of mild severity.  Nuclear stress EF: 41%.  Small fixed basal anteroseptal perfusion defect which is artifact. No reversible ischemia.  LVEF 41% with global hypokinesis. This is an intermediate risk study. Clinical correlation and echocardiographic evaluation of LV function is recommended.   Admission HPI:   Theodore Wood is a 65 year old man with hypertension, hyperlipidemia, alcohol use, and tobacco use visiting with chest pain.  Two weeks ago, Theodore Wood moved in with him, and he has been incredibly stressed out. Since that time, he has been having intermittent sharp substernal chest pain that comes and goes randomly, and is not associated with exertion. The pain is relieved by lifting Theodore left arm above Theodore head, and worse when pressing directly over it. He sometimes feels nauseous during these episodes, but denies diaphoresis or syncope. About 10 years ago, he had  a cardiac cath, and was told he had something in Theodore arteries, but he did not have a stent placed. This pain feels slightly similar to the pain he experienced of the time.  Over the last 2 weeks, he has significantly increased Theodore drinking. He is now drinking about a fifth per day; he's never had alcohol withdrawals. Theodore last drink was this morning. He's also been taking about 10 pills of ibuprofen a day to help Theodore pain. He denies any melena or hematochezia, but does have epigastric cramping in addition to the above mentioned chest pain.  Hospital Course by problem list:   1. Atypical angina: He presented with a two-week history of intermittent sharp substernal chest pain that started since Theodore Wood had moved in. During this time, he had been drinking about a fifth of liquor per day and taking 10 ibuprofens daily. Theodore EKG however showed new ST depression in the inferior leads, and new T-wave inversion in the inferior lateral leads. Troponins were normal times 3. Given Theodore risk factors of hypertension, hyperlipidemia, smoking, and alcohol abuse, we ordered a Myoview perfusion scan that showed global hypokinese with an ejection fraction of 41%, without reversible ischemia. A TTE was ordered what was pending as the patient left against medical advice. We were concerned for alcoholic cardiomyopathy, but a cath may be warranted pending the ejection fraction on the pending TTE.  2. Epigastric pain thought to be NSAID and alcohol-induced gastritis: In addition to Theodore angina, he also complained of cramping epigastric pain refill is most likely gastritis with or without peptic ulcer disease. Theodore hemoglobin was stable, and he denied melena. We held Theodore NSAIDs, and recommended he take Tylenol instead. We also advised he stop drinking alcohol.   3. Alcohol abuse: He was drinking about a fifth of liquor per day for the last 2 weeks. He had minor withdrawal symptoms while here and received some ativan as needed, but did  not have any delirium tremens or seizures.  4. Bilateral gynecomastia: We suspected this was related to Theodore alcohol use. A testosterone level was normal at 500. He did not have any discrete nodules but outpatient mammography could be considered if he should develop any.  5. Acute kidney injury: On admission, Theodore creatinine was 1.6, above Theodore baseline of less than 1. This was likely pre-renal as it corrected with fluids.  6. Hyperlipidemia: Theodore LDL was 186. He had a history of statin-induced myopathy with atorvastatin in the past. He was started on rosuvastatin.  Discharge Vitals:   BP 155/96 mmHg  Pulse 87  Temp(Src) 98.6 F (37 C) (Oral)  Resp 18  Ht 5\' 6"  (1.676 m)  Wt 69.4 kg (153 lb)  BMI 24.71 kg/m2  SpO2 100%  Discharge Labs:  No results  found for this or any previous visit (from the past 24 hour(s)).  Signed: [name], MD 01/14/2016, 1:57 PM

## 2016-01-13 ENCOUNTER — Observation Stay (HOSPITAL_BASED_OUTPATIENT_CLINIC_OR_DEPARTMENT_OTHER): Payer: Non-veteran care

## 2016-01-13 ENCOUNTER — Observation Stay (HOSPITAL_COMMUNITY): Payer: Non-veteran care

## 2016-01-13 DIAGNOSIS — I208 Other forms of angina pectoris: Secondary | ICD-10-CM | POA: Diagnosis not present

## 2016-01-13 DIAGNOSIS — I209 Angina pectoris, unspecified: Secondary | ICD-10-CM | POA: Diagnosis not present

## 2016-01-13 DIAGNOSIS — F1020 Alcohol dependence, uncomplicated: Secondary | ICD-10-CM | POA: Diagnosis not present

## 2016-01-13 DIAGNOSIS — R740 Nonspecific elevation of levels of transaminase and lactic acid dehydrogenase [LDH]: Secondary | ICD-10-CM | POA: Diagnosis not present

## 2016-01-13 DIAGNOSIS — R1013 Epigastric pain: Secondary | ICD-10-CM | POA: Diagnosis not present

## 2016-01-13 LAB — NM MYOCAR MULTI W/SPECT W/WALL MOTION / EF
CHL CUP MPHR: 157 {beats}/min
CHL CUP NUCLEAR SDS: 3
CHL CUP NUCLEAR SRS: 4
CHL CUP RESTING HR STRESS: 77 {beats}/min
CSEPEW: 1 METS
CSEPHR: 85 %
Exercise duration (min): 0 min
Exercise duration (sec): 0 s
LHR: 0.2
LV dias vol: 84 mL
LV sys vol: 50 mL
Peak HR: 134 {beats}/min
SSS: 6
TID: 1.27

## 2016-01-13 LAB — TESTOSTERONE: Testosterone: 507 ng/dL (ref 348–1197)

## 2016-01-13 LAB — CBC
HEMATOCRIT: 38 % — AB (ref 39.0–52.0)
Hemoglobin: 12.9 g/dL — ABNORMAL LOW (ref 13.0–17.0)
MCH: 30.9 pg (ref 26.0–34.0)
MCHC: 33.9 g/dL (ref 30.0–36.0)
MCV: 90.9 fL (ref 78.0–100.0)
Platelets: 167 10*3/uL (ref 150–400)
RBC: 4.18 MIL/uL — ABNORMAL LOW (ref 4.22–5.81)
RDW: 13.3 % (ref 11.5–15.5)
WBC: 4.5 10*3/uL (ref 4.0–10.5)

## 2016-01-13 LAB — HEMOGLOBIN A1C
HEMOGLOBIN A1C: 5.5 % (ref 4.8–5.6)
MEAN PLASMA GLUCOSE: 111 mg/dL

## 2016-01-13 MED ORDER — REGADENOSON 0.4 MG/5ML IV SOLN
0.4000 mg | Freq: Once | INTRAVENOUS | Status: AC
  Administered 2016-01-13: 0.4 mg via INTRAVENOUS
  Filled 2016-01-13: qty 5

## 2016-01-13 MED ORDER — ACETAMINOPHEN 325 MG PO TABS: 650.0000 mg | ORAL_TABLET | Freq: Four times a day (QID) | ORAL | Status: DC | PRN

## 2016-01-13 MED ORDER — PANTOPRAZOLE SODIUM 40 MG PO TBEC: 40.0000 mg | DELAYED_RELEASE_TABLET | Freq: Every day | ORAL | Status: DC

## 2016-01-13 MED ORDER — TECHNETIUM TC 99M SESTAMIBI GENERIC - CARDIOLITE
30.0000 | Freq: Once | INTRAVENOUS | Status: AC | PRN
  Administered 2016-01-13: 30 via INTRAVENOUS

## 2016-01-13 MED ORDER — TECHNETIUM TC 99M SESTAMIBI GENERIC - CARDIOLITE
10.0000 | Freq: Once | INTRAVENOUS | Status: AC | PRN
  Administered 2016-01-13: 10 via INTRAVENOUS

## 2016-01-13 MED ORDER — REGADENOSON 0.4 MG/5ML IV SOLN
INTRAVENOUS | Status: AC
  Filled 2016-01-13: qty 5

## 2016-01-13 MED ORDER — ROSUVASTATIN CALCIUM 40 MG PO TABS: 40.0000 mg | ORAL_TABLET | Freq: Every day | ORAL | Status: DC

## 2016-01-13 MED ORDER — NITROGLYCERIN 0.3 MG SL SUBL: 0.3000 mg | SUBLINGUAL_TABLET | SUBLINGUAL | Status: AC | PRN

## 2016-01-13 MED ORDER — FOLIC ACID 1 MG PO TABS: 1.0000 mg | ORAL_TABLET | Freq: Every day | ORAL | Status: DC

## 2016-01-13 MED ORDER — ADULT MULTIVITAMIN W/MINERALS CH: 1.0000 | ORAL_TABLET | Freq: Every day | ORAL | Status: DC

## 2016-01-13 MED ORDER — DICLOFENAC SODIUM 1 % TD GEL: 2.0000 g | Freq: Four times a day (QID) | TRANSDERMAL | Status: DC

## 2016-01-13 NOTE — Progress Notes (Signed)
Report received via Carleene Overlie RN in patient's room using SBAR format, reviewed chart, orders, labs, VS, meds and patient's general condition, assumed care of patient.

## 2016-01-13 NOTE — Progress Notes (Signed)
  Seen in Nuclear Medicine for 1-day NST. Official Read pending by [facility].  Signed, Erma Heritage, PA-C 01/13/2016, 9:29 AM Pager: [phone]

## 2016-01-13 NOTE — Progress Notes (Signed)
Patient ID: ALDRIDGE GLEDHILL, male   DOB: Mar 15, 1951, 65 y.o.   MRN: [id number]   Subjective: Mr. Rothwell is feeling "hungover" this morning and he feels bugs crawling over him. He denies any tremors or hallucinations. He says he's felt like this when he stopped drinking alcohol in the past. He denies any more episodes of chest pain overnight.  Objective: Vital signs in last 24 hours: Filed Vitals:   01/12/16 1835 01/12/16 1940 01/12/16 2340 01/13/16 0311  BP: 137/80 140/86 125/76 145/80  Pulse: 103 90 87 87  Temp:  99.3 F (37.4 C)  98.6 F (37 C)  TempSrc:  Oral  Oral  Resp:  15 16 19   Height:      Weight:    69.2 kg (152 lb 8.9 oz)  SpO2:  100% 100% 100%   Physical exam: General: very nice man resting in bed comfortably, appropriately conversational Cardiac: regular rate and rhythm, no rubs, murmurs or gallops, tender to palpation over his left breast Pulm: breathing well, clear to auscultation bilaterally Abd: bowel sounds normal, soft, slightly tender in epigastrum Ext: warm and well perfused, without pedal edema  Medications: I have reviewed the patient's current medications. Scheduled Meds: . diclofenac sodium  2 g Topical QID  . folic acid  1 mg Oral Daily  . heparin  5,000 Units Subcutaneous 3 times per day  . multivitamin with minerals  1 tablet Oral Daily  . pantoprazole  40 mg Oral Daily  . rosuvastatin  40 mg Oral q1800  . sodium chloride flush  3 mL Intravenous Q12H  . thiamine  100 mg Oral Daily   Or  . thiamine  100 mg Intravenous Daily   Continuous Infusions:  PRN Meds:.acetaminophen, LORazepam **OR** LORazepam, nitroGLYCERIN, ondansetron **OR** ondansetron (ZOFRAN) IV  Assessment/Plan:  Atypical angina: His chest pain is atypical and sounds most like gastritis but he has EKG changes with normal troponins. We'll send him for nuclear medicine stress test this morning; if low-risk, he can go home. If high risk, he'll need left heart cath. A1c, HIV, and  hepatitis C were all normal. -Myoview today -Telemetry overnight -Continue aspirin 81 mg daily  Epigastric pain: Given his increased alcohol use and significant NSAID intake, this sounds most like peptic ulcer disease. His hemoglobin is stable at 12. -Tylenol for pain -Started pantoprazole 40 mg twice daily -Holding NSAIDs -Consider outpatient endoscopy  Alcohol abuse: He is drinking about a fifth per day for the last 2 weeks, last drink on the morning of 1/28. He has some mild withdrawal symptoms this morning but his vitals are stable. -CIWA protocol -B1 and B9 vitamins  Hypertension: Pressures looks good on this regimen. May need to start beta-blocker depending on Myoview results. -Continue lisinopril 10 mg daily -Continue thiazide 12.5 mg daily  Hyperlipidemia: LDL 180. He had statin-induced myopathy to atorvastatin in the past. I advised him to stop the rosuvastatin if he starts feeling cramps like previously. -Continue rosuvastatin 80mg   Bilateral gynecomastia: I think this is most likely related to his alcohol use, but he doesn't have any other clinical signs of cirrhosis. We will check a testosterone level to be thorough. If abnormally low, we will repeat an check an LH. I don't feel a discrete nodule but unilateral gynecomastia in a male signs concerning for malignancy; I'll recommend an outpatient mammography in my discharge summary. -Ordered testosterone level -Consider outpatient mammography upon discharge  Dispo: Disposition is deferred at this time, awaiting improvement of current medical problems.  Anticipated discharge in approximately 0-1 day(s).   The [facility] and does not need an [facility] hospital follow-up appointment after discharge. He has an appointment on February 9th  The patient does not have transportation limitations that hinder transportation to clinic appointments.  .Services Needed at time of discharge: Y = Yes, Blank = No PT:     OT:   RN:   Equipment:   Other:       [name], MD 01/13/2016, 8:10 AM

## 2016-01-13 NOTE — Progress Notes (Signed)
Patient becoming a little restless and anxious with some mild tremors, medicared with 1 mg IV Ativan and given 650 mg po Tylenol for back and abdominal discomfort, will continue to monitor.

## 2016-01-14 ENCOUNTER — Observation Stay (HOSPITAL_BASED_OUTPATIENT_CLINIC_OR_DEPARTMENT_OTHER): Payer: Non-veteran care

## 2016-01-14 DIAGNOSIS — F1020 Alcohol dependence, uncomplicated: Secondary | ICD-10-CM

## 2016-01-14 DIAGNOSIS — I209 Angina pectoris, unspecified: Secondary | ICD-10-CM | POA: Diagnosis not present

## 2016-01-14 DIAGNOSIS — R9430 Abnormal result of cardiovascular function study, unspecified: Secondary | ICD-10-CM | POA: Diagnosis not present

## 2016-01-14 DIAGNOSIS — R1013 Epigastric pain: Secondary | ICD-10-CM | POA: Diagnosis not present

## 2016-01-14 DIAGNOSIS — R072 Precordial pain: Secondary | ICD-10-CM | POA: Diagnosis not present

## 2016-01-14 DIAGNOSIS — I10 Essential (primary) hypertension: Secondary | ICD-10-CM | POA: Diagnosis not present

## 2016-01-14 DIAGNOSIS — I420 Dilated cardiomyopathy: Secondary | ICD-10-CM

## 2016-01-14 DIAGNOSIS — R079 Chest pain, unspecified: Secondary | ICD-10-CM

## 2016-01-14 DIAGNOSIS — E785 Hyperlipidemia, unspecified: Secondary | ICD-10-CM

## 2016-01-14 DIAGNOSIS — N62 Hypertrophy of breast: Secondary | ICD-10-CM

## 2016-01-14 MED ORDER — WHITE PETROLATUM GEL
Status: AC
  Administered 2016-01-14: 10:00:00
  Filled 2016-01-14: qty 1

## 2016-01-14 MED ORDER — CHLORDIAZEPOXIDE HCL 5 MG PO CAPS
10.0000 mg | ORAL_CAPSULE | Freq: Two times a day (BID) | ORAL | Status: DC
Start: 2016-01-14 — End: 2016-01-14
  Administered 2016-01-14: 10 mg via ORAL
  Filled 2016-01-14: qty 2

## 2016-01-14 NOTE — Progress Notes (Signed)
Patient has been told numerous times to stay in bed and he was found at the nurse's station shortly after explaining the call light and placing it on his abdomen. Charge nurse took him back to his room but he was very disoriented, went back to check on him and his CIWA score was 12-13, patient was given 1mg  IV Ativan earlier, will give his po dose if unable to keep him calm, will continue to monitor.

## 2016-01-14 NOTE — Progress Notes (Signed)
Patient is now sleeping in his bed with no s/s of distress or discomfort, no sweats noted and no tremors seen at this time, will continue to monitor.

## 2016-01-14 NOTE — Progress Notes (Signed)
Pt has left AMA at 1355. IV has been removed. Pt has signed [place] papers. MD is aware

## 2016-01-14 NOTE — Progress Notes (Signed)
Patient ID: Theodore Wood, male   DOB: 1951-05-04, 65 y.o.   MRN: [id number]   Subjective: Theodore Wood is feeling tremulous this morning but denies any more episodes of chest pain. He's eager to go home; if the cardiologists feel he should have a left-heart cath, he'd like to have it done at the [place].  Objective: Vital signs in last 24 hours: Filed Vitals:   01/13/16 1415 01/13/16 2100 01/13/16 2336 01/14/16 0308  BP: 155/88 164/99 161/104 155/96  Pulse: 100 94 96 87  Temp: 98.8 F (37.1 C) 98.6 F (37 C)  98.6 F (37 C)  TempSrc: Oral Oral  Oral  Resp: 18   18  Height:      Weight:    69.4 kg (153 lb)  SpO2: 99% 100%  100%   Physical exam: General: very nice man resting in bed comfortably, appropriately conversational Cardiac: regular rate and rhythm, no rubs, murmurs or gallops, tender to palpation over his left breast Pulm: breathing well, clear to auscultation bilaterally Abd: bowel sounds normal, soft, slightly tender in epigastrum Ext: warm and well perfused, without pedal edema  Studies/Results: Nm Myocar Multi W/spect W/wall Motion / Ef  01/13/2016   There was no ST segment deviation noted during stress.  T wave inversion was noted during stress in the II, III, aVF, V5, V4 and V6 leads, beginning at 1 minutes of stress, ending at 3 minutes of stress, and returning to baseline after 1-5 mins of recovery.  Defect 1: There is a small defect of mild severity.  Nuclear stress EF: 41%.  Small fixed basal anteroseptal perfusion defect which is artifact. No reversible ischemia. LVEF 41% with global hypokinesis. This is an intermediate risk study. Clinical correlation and echocardiographic evaluation of LV function is recommended.   Medications: I have reviewed the patient's current medications. Scheduled Meds: . chlordiazePOXIDE  10 mg Oral BID  . diclofenac sodium  2 g Topical QID  . folic acid  1 mg Oral Daily  . heparin  5,000 Units Subcutaneous 3 times per day  .  multivitamin with minerals  1 tablet Oral Daily  . pantoprazole  40 mg Oral Daily  . rosuvastatin  40 mg Oral q1800  . sodium chloride flush  3 mL Intravenous Q12H  . thiamine  100 mg Oral Daily   Or  . thiamine  100 mg Intravenous Daily   Continuous Infusions:  PRN Meds:.acetaminophen, LORazepam **OR** LORazepam, nitroGLYCERIN, ondansetron **OR** ondansetron (ZOFRAN) IV   Assessment/Plan:  Atypical angina: His chest pain is atypical and sounds most like gastritis, but he had new TWI in the inferiolateral leads  with normal troponins, so we ordered a nuclear medicine stress test yesterday that showed no reversible ischemia but an ejection fraction of 41% with global hyperkinesis. It was read as intermediate study, so I called cardiology to evaluate him to see if he needs urgent cath or can be discharged with follow-up at the [facility]. I think this is most likely alcohol-induced cardiomyopathy but we'll defer to the cardiologists' expertise. -Consulted cardiology -Continue aspirin 81 mg daily  Epigastric pain: Given his increased alcohol use and significant NSAID intake, this sounds most like peptic ulcer disease. His hemoglobin is stable. -Tylenol for pain -Continue pantoprazole 40 mg twice daily -Holding NSAIDs -Consider outpatient endoscopy  Alcohol abuse: He is drinking about a fifth per day for the last 2 weeks, last drink on the morning of 1/28. He has some mild withdrawal symptoms this morning but his vitals  are stable. I started a Librium taper and will continue this upon discharge. -CIWA protocol -B1 and B9 vitamins -Chlordiazepoxide 10 mg twice daily for now  Hypertension: Pressures looks good on this regimen. May need to start beta-blocker depending on Myoview results. -Continue lisinopril 10 mg daily -Continue thiazide 12.5 mg daily  Hyperlipidemia: LDL 180. He had statin-induced myopathy to atorvastatin in the past. I advised him to stop the rosuvastatin if he starts  feeling cramps like previously. -Continue rosuvastatin 80mg   Bilateral gynecomastia: I think this is most likely related to his alcohol use, but he doesn't have any other clinical signs of cirrhosis. His testosterone level was normal. I don't feel a discrete nodule but unilateral gynecomastia in a male signs concerning for malignancy; I'll recommend an outpatient mammography in my discharge summary. -Consider outpatient mammography upon discharge  Dispo: Disposition is deferred at this time, awaiting improvement of current medical problems.  Anticipated discharge in approximately 0-1 day(s).   The patient does have a current PCP (Theodore Wood) and does need an [facility] follow-up appointment after discharge.  The patient does not have transportation limitations that hinder transportation to clinic appointments.  .Services Needed at time of discharge: Y = Yes, Blank = No PT:   OT:   RN:   Equipment:   Other:       [name], MD 01/14/2016, 10:57 AM

## 2016-01-14 NOTE — Progress Notes (Signed)
PT IV removed. Pt states he will be leaving at 2 Pm. MD has been notified

## 2016-01-14 NOTE — Progress Notes (Signed)
  Echocardiogram 2D Echocardiogram has been performed.  Theodore Wood 01/14/2016, 1:24 PM

## 2016-01-14 NOTE — Consult Note (Addendum)
Reason for Consult:   Chest pain, abnormal Myoview  Requesting Physician: Dr Beryle Beams Primary Cardiologist New  HPI:   65 y/o AA male, alcoholic by history, followed at [place] with a history of HTN and hyperlipidemia with prior statin myopathy. There is a history of a cath in the 90's but the pt denied this to me. He was admitted 01/11/16 with chest pain. The pt says his son returned home after being in prison and this has caused increased stress and he admits to increased ETOH intake as well as frequent NSAID use. The pt presented to the ED with complaints of epigastric pain. Troponin was negative. His EKG was abnormal with inferior TWI as well as anterior NSST changes. He had a Myoview done 1/30 was abnormal with an EF of 41%, no ischemia. Study read as "intermediate" risk and we are consulted.           When I came to see the pt he was dressing in his street clothes, "getting ready to leave". He agreed to stay and let me examine him. He denied and history of exertional chest pain. He said he never had a cath and if he needed one now he wants to get it at the [place]. He has agreed to stay for an echo and further recommendations based on that.   PMHx:  Past Medical History  Diagnosis Date  . Hypertension   . Angina   . GERD (gastroesophageal reflux disease)   . High cholesterol     Past Surgical History  Procedure Laterality Date  . Cardiac catheterization  1990's    SOCHx:  reports that he quit smoking about 13 years ago. His smoking use included Cigarettes. He has a 20 pack-year smoking history. He has never used smokeless tobacco. He reports that he drinks about 25.2 oz of alcohol per week. He reports that he uses illicit drugs (Cocaine, Marijuana, and Heroin).  FAMHx: Mother with HTN  ALLERGIES: No Known Allergies  ROS: Review of Systems: General: negative for chills, fever, night sweats or weight changes.  Cardiovascular: negative for chest pain, dyspnea on  exertion, edema, orthopnea, palpitations, paroxysmal nocturnal dyspnea or shortness of breath HEENT: negative for any visual disturbances, blindness, glaucoma Dermatological: negative for rash Respiratory: negative for cough, hemoptysis, or wheezing Urologic: negative for hematuria or dysuria Abdominal: negative for nausea, vomiting, diarrhea, bright red blood per rectum, melena, or hematemesis Neurologic: negative for visual changes, syncope, or dizziness Musculoskeletal: negative for back pain, joint pain, or swelling Psych: cooperative and appropriate All other systems reviewed and are otherwise negative except as noted above.   HOME MEDICATIONS: Prior to Admission medications   Medication Sig Start Date End Date Taking? Authorizing Provider  fish oil-omega-3 fatty acids 1000 MG capsule Take 1 g by mouth daily.    Yes Historical Provider, MD  ibuprofen (ADVIL,MOTRIN) 800 MG tablet Take 1 tablet (800 mg total) by mouth 3 (three) times daily. 08/02/15  Yes Marella Chimes, PA-C  lisinopril-hydrochlorothiazide (PRINZIDE,ZESTORETIC) 10-12.5 MG per tablet Take 1 tablet by mouth daily.   Yes Historical Provider, MD  acetaminophen (TYLENOL) 325 MG tablet Take 2 tablets (650 mg total) by mouth every 6 (six) hours as needed for moderate pain. 01/13/16   [name], MD  diclofenac sodium (VOLTAREN) 1 % GEL Apply 2 g topically 4 (four) times daily. 01/13/16   [name], MD  folic acid (FOLVITE) 1 MG tablet Take 1 tablet (1 mg total) by  mouth daily. 01/13/16   [name], MD  meloxicam (MOBIC) 7.5 MG tablet Take 1 tablet (7.5 mg total) by mouth daily. Take daily for 5 days, then daily as needed for pain. Patient not taking: Reported on 01/11/2016 08/28/14   Noland Fordyce, PA-C  Multiple Vitamin (MULTIVITAMIN WITH MINERALS) TABS tablet Take 1 tablet by mouth daily. 01/13/16   [name], MD  nitroGLYCERIN (NITROSTAT) 0.3 MG SL tablet Place 1 tablet (0.3 mg total) under the tongue every 5 (five)  minutes as needed for chest pain. 01/13/16   [name], MD  pantoprazole (PROTONIX) 40 MG tablet Take 1 tablet (40 mg total) by mouth daily. 01/13/16   [name], MD  rosuvastatin (CRESTOR) 40 MG tablet Take 1 tablet (40 mg total) by mouth daily at 6 PM. 01/13/16   [name], MD  traMADol (ULTRAM) 50 MG tablet Take 1 tablet (50 mg total) by mouth every 6 (six) hours as needed. Patient not taking: Reported on 01/11/2016 08/28/14   Noland Fordyce, [place]: I have reviewed the patient's current medications.  VITALS: Blood pressure 155/96, pulse 87, temperature 98.6 F (37 C), temperature source Oral, resp. rate 18, height 5\' 6"  (1.676 m), weight 153 lb (69.4 kg), SpO2 100 %.  PHYSICAL EXAM: General appearance: alert, cooperative and no distress Neck: no carotid bruit and no JVD Lungs: clear to auscultation bilaterally Heart: regular rate and rhythm Abdomen: soft, non-tender; bowel sounds normal; no masses,  no organomegaly Extremities: no edema Pulses: 2+ and symmetric Skin: cool and dry Neurologic: Grossly normal  LABS: No results found for this or any previous visit (from the past 24 hour(s)).  EKG: NSR with TWI 2,3,F- V3-V4  IMAGING: Nm Myocar Multi W/spect W/wall Motion / Ef  01/13/2016   There was no ST segment deviation noted during stress.  T wave inversion was noted during stress in the II, III, aVF, V5, V4 and V6 leads, beginning at 1 minutes of stress, ending at 3 minutes of stress, and returning to baseline after 1-5 mins of recovery.  Defect 1: There is a small defect of mild severity.  Nuclear stress EF: 41%.  Small fixed basal anteroseptal perfusion defect which is artifact. No reversible ischemia. LVEF 41% with global hypokinesis. This is an intermediate risk study. Clinical correlation and echocardiographic evaluation of LV function is recommended.    IMPRESSION: Principal Problem:   Chest pain Active Problems:   Abnormal cardiac function  test   HTN (hypertension)   Alcohol abuse   Abnormal EKG   Statin myopathy   Hyperlipidemia   Gynecomastia, male   AKI (acute kidney injury) ([place])   RECOMMENDATION: Await echo for further recommendations, suspect alcoholic cardiomyopathy.   Time Spent Directly with Patient: 40 minutes  Kerin Ransom, [place] beeper 01/14/2016, 11:20 AM    I have personally seen and examined this patient with Kerin Ransom, PA-C. I agree with the assessment and plan as outlined above. He is admitted with chest pain. Troponin negative. Nuclear stress test with no ischemia but LvEF=41%. Pt has demanded to leave AMA. He had his echo today but does not wish to stay for results. He is followed in the [place] [facility] in [place]. [facility]. [facility] refuses [facility]. He is leaving AMA.   Toi Stelly 01/14/2016 1:54 PM

## 2017-11-25 ENCOUNTER — Emergency Department (HOSPITAL_COMMUNITY): Payer: Non-veteran care

## 2017-11-25 ENCOUNTER — Emergency Department (HOSPITAL_COMMUNITY)
Admission: EM | Admit: 2017-11-25 | Discharge: 2017-11-25 | Disposition: A | Discharge status: Home / Self Care | Payer: Non-veteran care | Attending: Emergency Medicine | Admitting: Emergency Medicine

## 2017-11-25 ENCOUNTER — Encounter (HOSPITAL_COMMUNITY): Payer: Self-pay

## 2017-11-25 ENCOUNTER — Other Ambulatory Visit: Payer: Self-pay

## 2017-11-25 DIAGNOSIS — Z87891 Personal history of nicotine dependence: Secondary | ICD-10-CM | POA: Diagnosis not present

## 2017-11-25 DIAGNOSIS — W000XXA Fall on same level due to ice and snow, initial encounter: Secondary | ICD-10-CM | POA: Diagnosis not present

## 2017-11-25 DIAGNOSIS — Y939 Activity, unspecified: Secondary | ICD-10-CM | POA: Diagnosis not present

## 2017-11-25 DIAGNOSIS — Z79899 Other long term (current) drug therapy: Secondary | ICD-10-CM | POA: Diagnosis not present

## 2017-11-25 DIAGNOSIS — Y929 Unspecified place or not applicable: Secondary | ICD-10-CM | POA: Insufficient documentation

## 2017-11-25 DIAGNOSIS — S63501A Unspecified sprain of right wrist, initial encounter: Secondary | ICD-10-CM | POA: Diagnosis not present

## 2017-11-25 DIAGNOSIS — I10 Essential (primary) hypertension: Secondary | ICD-10-CM | POA: Diagnosis not present

## 2017-11-25 DIAGNOSIS — S6991XA Unspecified injury of right wrist, hand and finger(s), initial encounter: Secondary | ICD-10-CM | POA: Diagnosis present

## 2017-11-25 DIAGNOSIS — Y999 Unspecified external cause status: Secondary | ICD-10-CM | POA: Diagnosis not present

## 2017-11-25 MED ORDER — OXYCODONE-ACETAMINOPHEN 5-325 MG PO TABS: 1.0000 | ORAL_TABLET | Freq: Once | ORAL | Status: DC

## 2017-11-25 MED ORDER — OXYCODONE-ACETAMINOPHEN 5-325 MG PO TABS: 1.0000 | ORAL_TABLET | Freq: Four times a day (QID) | ORAL | 0 refills | Status: DC | PRN

## 2017-11-25 MED ORDER — MORPHINE SULFATE (PF) 4 MG/ML IV SOLN: 4.0000 mg | Freq: Once | INTRAVENOUS | Status: DC

## 2017-11-25 MED ORDER — OXYCODONE-ACETAMINOPHEN 5-325 MG PO TABS
1.0000 | ORAL_TABLET | Freq: Once | ORAL | Status: AC
  Administered 2017-11-25: 1 via ORAL
  Filled 2017-11-25: qty 1

## 2017-11-25 NOTE — Discharge Instructions (Signed)
Please read attached information. If you experience any new or worsening signs or symptoms please return to the emergency room for evaluation. Please follow-up with your primary care provider or specialist as discussed. Please use medication prescribed only as directed and discontinue taking if you have any concerning signs or symptoms.   °

## 2017-11-25 NOTE — ED Triage Notes (Signed)
Patient here with right hand/wrist pain after slipping on ice on Tuesday and swelling noted, positive distal pulses

## 2017-11-25 NOTE — ED Provider Notes (Signed)
[facility] EMERGENCY DEPARTMENT Provider Note   CSN: [id number] Arrival date & time: 11/25/17  1019     History   Chief Complaint No chief complaint on file.   HPI Theodore Wood is a 66 y.o. male.  HPI   66 year old male presents status post fall.  Patient reports that 2 days ago he slipped on ice landing on right hand.  He notes pain to the hand and proximal wrist.  He notes swelling around the area.  He reports pain with movement of his wrist hands and fingers, sensation intact.  He notes using ibuprofen without significant improvement in symptoms.  Patient denies any other injury, denies any pain to the elbow shoulder.  Past Medical History:  Diagnosis Date  . Angina   . GERD (gastroesophageal reflux disease)   . High cholesterol   . Hypertension     Patient Active Problem List   Diagnosis Date Noted  . Abnormal cardiac function test 01/14/2016  . Abnormal EKG   . Hyperlipidemia 01/11/2016  . Tobacco abuse 01/11/2016  . Epigastric pain 01/11/2016  . Gynecomastia, male 01/11/2016  . AKI (acute kidney injury) ([place]) 01/11/2016  . Atypical chest pain 01/11/2016  . Statin myopathy 02/04/2012  . Chest pain 02/03/2012  . Rhabdomyolysis 02/03/2012  . HTN (hypertension) 02/03/2012  . Alcohol abuse 02/03/2012  . Cocaine abuse ([place]) 02/03/2012    Past Surgical History:  Procedure Laterality Date  . CARDIAC CATHETERIZATION  1990's       Home Medications    Prior to Admission medications   Medication Sig Start Date End Date Taking? Authorizing Provider  acetaminophen (TYLENOL) 325 MG tablet Take 2 tablets (650 mg total) by mouth every 6 (six) hours as needed for moderate pain. 01/13/16   [name], MD  diclofenac sodium (VOLTAREN) 1 % GEL Apply 2 g topically 4 (four) times daily. 01/13/16   [name], MD  fish oil-omega-3 fatty acids 1000 MG capsule Take 1 g by mouth daily.     [provider]  folic acid (FOLVITE) 1 MG tablet  Take 1 tablet (1 mg total) by mouth daily. 01/13/16   [name], MD  lisinopril-hydrochlorothiazide (PRINZIDE,ZESTORETIC) 10-12.5 MG per tablet Take 1 tablet by mouth daily.    [provider]  Multiple Vitamin (MULTIVITAMIN WITH MINERALS) TABS tablet Take 1 tablet by mouth daily. 01/13/16   [name], MD  nitroGLYCERIN (NITROSTAT) 0.3 MG SL tablet Place 1 tablet (0.3 mg total) under the tongue every 5 (five) minutes as needed for chest pain. 01/13/16   [name], MD  oxyCODONE-acetaminophen (PERCOCET/ROXICET) 5-325 MG tablet Take 1 tablet by mouth every 6 (six) hours as needed for severe pain. 11/25/17   Adalie Mand, Dellis Filbert, PA-C  pantoprazole (PROTONIX) 40 MG tablet Take 1 tablet (40 mg total) by mouth daily. 01/13/16   [name], MD  rosuvastatin (CRESTOR) 40 MG tablet Take 1 tablet (40 mg total) by mouth daily at 6 PM. 01/13/16   [name], MD    Family History No family history on file.  Social History Social History   Tobacco Use  . Smoking status: Former Smoker    Packs/day: 1.00    Years: 20.00    Pack years: 20.00    Types: Cigarettes    Last attempt to quit: 02/02/2002    Years since quitting: 15.8  . Smokeless tobacco: Never Used  Substance Use Topics  . Alcohol use: Yes    Alcohol/week: 25.2 oz    Types:  42 Cans of beer per week    Comment: 4 X  a week "a couple of beers"  . Drug use: Yes    Types: Cocaine, Marijuana, Heroin    Comment: 02/03/12 "3 days ago I did cocaine to get the pain off; used heroin years ago" 08/28/2014 " I stopped using all that"     Allergies   Patient has no known allergies.   Review of Systems Review of Systems  All other systems reviewed and are negative.    Physical Exam Updated Vital Signs BP (!) 170/101 (BP Location: Left Arm)   Pulse 100   Temp 98.8 F (37.1 C) (Oral)   Resp 18   SpO2 99%   Physical Exam  Constitutional: He is oriented to person, place, and time. He appears well-developed and well-nourished.   HENT:  Head: Normocephalic and atraumatic.  Eyes: Conjunctivae are normal. Pupils are equal, round, and reactive to light. Right eye exhibits no discharge. Left eye exhibits no discharge. No scleral icterus.  Neck: Normal range of motion. No JVD present. No tracheal deviation present.  Pulmonary/Chest: Effort normal. No stridor.  Musculoskeletal:  Swelling noted at the right wrist and proximal hand, decreased range of motion due to pain, sensation intact patient is able to move fingers individually-remainder of upper extremity nontender full active range of motion  Neurological: He is alert and oriented to person, place, and time. Coordination normal.  Psychiatric: He has a normal mood and affect. His behavior is normal. Judgment and thought content normal.  Nursing note and vitals reviewed.    ED Treatments / Results  Labs (all labs ordered are listed, but only abnormal results are displayed) Labs Reviewed - No data to display  EKG  EKG Interpretation None       Radiology Dg Wrist Complete Right  Result Date: 11/25/2017 CLINICAL DATA:  Fall, right hand and wrist pain. EXAM: RIGHT WRIST - COMPLETE 3+ VIEW COMPARISON:  Right hand series performed today. FINDINGS: Arthritic changes in the radiocarpal joint and first carpometacarpal joint. No acute bony abnormality. Specifically, no fracture, subluxation, or dislocation. Soft tissues are intact. IMPRESSION: No acute bony abnormality. Electronically Signed   By: Rolm Baptise M.D.   On: 11/25/2017 10:56   Dg Hand Complete Right  Result Date: 11/25/2017 CLINICAL DATA:  Fall, right hand injury. EXAM: RIGHT HAND - COMPLETE 3+ VIEW COMPARISON:  None. FINDINGS: Mild arthritic changes in the right wrist in the radiocarpal joint and first carpometacarpal joint. No acute bony abnormality. Specifically, no fracture, subluxation, or dislocation. Soft tissues are intact. IMPRESSION: No acute bony abnormality. Electronically Signed   By: Rolm Baptise M.D.   On: 11/25/2017 10:55    Procedures Procedures (including critical care time)  SPLINT APPLICATION Date/Time: 8:54 PM Authorized by: Stevie Kern Daneen Volcy Consent: Verbal consent obtained. Risks and benefits: risks, benefits and alternatives were discussed Consent given by: patient Splint applied by: orthopedic technician Location details: right wrist Splint type: velcro Supplies used: velcro  Post-procedure: The splinted body part was neurovascularly unchanged following the procedure. Patient tolerance: Patient tolerated the procedure well with no immediate complications.     Medications Ordered in ED Medications  oxyCODONE-acetaminophen (PERCOCET/ROXICET) 5-325 MG per tablet 1 tablet (1 tablet Oral Given 11/25/17 1240)     Initial Impression / Assessment and Plan / ED Course  I have reviewed the triage vital signs and the nursing notes.  Pertinent labs & imaging results that were available during my care of the patient were  reviewed by me and considered in my medical decision making (see chart for details).      Final Clinical Impressions(s) / ED Diagnoses   Final diagnoses:  Sprain of right wrist, initial encounter    66 year old male presents today with wrist sprain.  No acute fractures noted on plain films.  Patient has no significant snuffbox tenderness.  No signs of compartment syndrome.  He will be placed in a wrist brace, given pain medication and discharged with outpatient orthopedic follow-up.  Patient given strict return precautions, he verbalized understanding and agreement to today's plan.    ED Discharge Orders        Ordered    oxyCODONE-acetaminophen (PERCOCET/ROXICET) 5-325 MG tablet  Every 6 hours PRN     11/25/17 1307       Okey Regal, PA-C 11/25/17 1311    Tegeler, Gwenyth Allegra, MD 11/25/17 2024

## 2017-11-25 NOTE — ED Notes (Signed)
Pt states he understands instructions. Home stable with steaxdy gait.

## 2017-11-25 NOTE — ED Notes (Signed)
Pt's right wrist immobilized in a splint.  Pt endorses understanding about how to apply the wrist splint

## 2018-09-02 ENCOUNTER — Emergency Department (HOSPITAL_COMMUNITY): Payer: Non-veteran care

## 2018-09-02 ENCOUNTER — Encounter (HOSPITAL_COMMUNITY): Payer: Self-pay

## 2018-09-02 ENCOUNTER — Emergency Department (HOSPITAL_COMMUNITY)
Admission: EM | Admit: 2018-09-02 | Discharge: 2018-09-02 | Disposition: A | Discharge status: Home / Self Care | Payer: Non-veteran care | Attending: Emergency Medicine | Admitting: Emergency Medicine

## 2018-09-02 ENCOUNTER — Other Ambulatory Visit: Payer: Self-pay

## 2018-09-02 DIAGNOSIS — I10 Essential (primary) hypertension: Secondary | ICD-10-CM | POA: Insufficient documentation

## 2018-09-02 DIAGNOSIS — R0789 Other chest pain: Secondary | ICD-10-CM

## 2018-09-02 DIAGNOSIS — Z79899 Other long term (current) drug therapy: Secondary | ICD-10-CM | POA: Insufficient documentation

## 2018-09-02 DIAGNOSIS — Z87891 Personal history of nicotine dependence: Secondary | ICD-10-CM | POA: Insufficient documentation

## 2018-09-02 LAB — BASIC METABOLIC PANEL
ANION GAP: 11 (ref 5–15)
BUN: 9 mg/dL (ref 8–23)
CHLORIDE: 101 mmol/L (ref 98–111)
CO2: 26 mmol/L (ref 22–32)
Calcium: 9 mg/dL (ref 8.9–10.3)
Creatinine, Ser: 0.99 mg/dL (ref 0.61–1.24)
GFR calc Af Amer: >60 mL/min (ref >60)
GFR calc non Af Amer: >60 mL/min (ref >60)
GLUCOSE: 123 mg/dL — AB (ref 70–99)
POTASSIUM: 3.9 mmol/L (ref 3.5–5.1)
Sodium: 138 mmol/L (ref 135–145)

## 2018-09-02 LAB — CBC
HEMATOCRIT: 45.5 % (ref 39.0–52.0)
HEMOGLOBIN: 15.6 g/dL (ref 13.0–17.0)
MCH: 30.5 pg (ref 26.0–34.0)
MCHC: 34.3 g/dL (ref 30.0–36.0)
MCV: 89 fL (ref 78.0–100.0)
Platelets: 198 10*3/uL (ref 150–400)
RBC: 5.11 MIL/uL (ref 4.22–5.81)
RDW: 12.6 % (ref 11.5–15.5)
WBC: 10 10*3/uL (ref 4.0–10.5)

## 2018-09-02 LAB — I-STAT TROPONIN, ED
TROPONIN I, POC: 0 ng/mL (ref 0.00–0.08)
Troponin i, poc: 0.01 ng/mL (ref 0.00–0.08)

## 2018-09-02 MED ORDER — HYDROXYZINE HCL 25 MG PO TABS
25.0000 mg | ORAL_TABLET | Freq: Once | ORAL | Status: AC
  Administered 2018-09-02: 25 mg via ORAL
  Filled 2018-09-02: qty 1

## 2018-09-02 NOTE — ED Triage Notes (Signed)
Pt presents with onset of chest pain, dizziness while going home from having a prostate biopsy this morning at the [facility].

## 2018-09-02 NOTE — ED Provider Notes (Signed)
[facility] EMERGENCY DEPARTMENT Provider Note   CSN: [id number] Arrival date & time: 09/02/18  1235     History   Chief Complaint Chief Complaint  Patient presents with  . Chest Pain    HPI Theodore Wood is a 67 y.o. male.  The history is provided by the patient. No language interpreter was used.  Chest Pain      67 year old male with history of hypertension, hyperlipidemia, polysubstance abuse presenting for evaluation of chest pain.  Patient report this morning he went to [facility] for a prostate biopsy.  He was told that he was having high blood pressure while he was having a procedure.  They monitor him for low back and he was giving a "orange pill" and sent home.  His friend was driving him home and while driving home, patient felt his heart racing, bodies flushing, having pressure in his chest and shortness of breath which concerns him.  He request to be brought to the hospital for further evaluation.  Since being in the hospital, his symptoms have since subsided.  Aside from discomfort from his rectum from the procedure, he denies having any significant chest pain just mild chest pressure.  His shortness of breath has since resolved.  He denies having fever, chills, productive cough, abdominal pain.  Past Medical History:  Diagnosis Date  . Angina   . GERD (gastroesophageal reflux disease)   . High cholesterol   . Hypertension     Patient Active Problem List   Diagnosis Date Noted  . Abnormal cardiac function test 01/14/2016  . Abnormal EKG   . Hyperlipidemia 01/11/2016  . Tobacco abuse 01/11/2016  . Epigastric pain 01/11/2016  . Gynecomastia, male 01/11/2016  . AKI (acute kidney injury) ([place]) 01/11/2016  . Atypical chest pain 01/11/2016  . Statin myopathy 02/04/2012  . Chest pain 02/03/2012  . Rhabdomyolysis 02/03/2012  . HTN (hypertension) 02/03/2012  . Alcohol abuse 02/03/2012  . Cocaine abuse ([place]) 02/03/2012    Past Surgical  History:  Procedure Laterality Date  . CARDIAC CATHETERIZATION  1990's        Home Medications    Prior to Admission medications   Medication Sig Start Date End Date Taking? Authorizing Provider  acetaminophen (TYLENOL) 325 MG tablet Take 2 tablets (650 mg total) by mouth every 6 (six) hours as needed for moderate pain. 01/13/16   [name], MD  diclofenac sodium (VOLTAREN) 1 % GEL Apply 2 g topically 4 (four) times daily. 01/13/16   [name], MD  fish oil-omega-3 fatty acids 1000 MG capsule Take 1 g by mouth daily.     [provider]  folic acid (FOLVITE) 1 MG tablet Take 1 tablet (1 mg total) by mouth daily. 01/13/16   [name], MD  lisinopril-hydrochlorothiazide (PRINZIDE,ZESTORETIC) 10-12.5 MG per tablet Take 1 tablet by mouth daily.    [provider]  Multiple Vitamin (MULTIVITAMIN WITH MINERALS) TABS tablet Take 1 tablet by mouth daily. 01/13/16   [name], MD  nitroGLYCERIN (NITROSTAT) 0.3 MG SL tablet Place 1 tablet (0.3 mg total) under the tongue every 5 (five) minutes as needed for chest pain. 01/13/16   [name], MD  oxyCODONE-acetaminophen (PERCOCET/ROXICET) 5-325 MG tablet Take 1 tablet by mouth every 6 (six) hours as needed for severe pain. 11/25/17   Hedges, Dellis Filbert, PA-C  pantoprazole (PROTONIX) 40 MG tablet Take 1 tablet (40 mg total) by mouth daily. 01/13/16   [name], MD  rosuvastatin (CRESTOR) 40 MG tablet  Take 1 tablet (40 mg total) by mouth daily at 6 PM. 01/13/16   [name], MD    Family History History reviewed. No pertinent family history.  Social History Social History   Tobacco Use  . Smoking status: Former Smoker    Packs/day: 1.00    Years: 20.00    Pack years: 20.00    Types: Cigarettes    Last attempt to quit: 02/02/2002    Years since quitting: 16.5  . Smokeless tobacco: Never Used  Substance Use Topics  . Alcohol use: Yes    Alcohol/week: 42.0 standard drinks    Types: 42 Cans of beer per week     Comment: 4 X  a week "a couple of beers"  . Drug use: Yes    Types: Cocaine, Marijuana, Heroin    Comment: 02/03/12 "3 days ago I did cocaine to get the pain off; used heroin years ago" 08/28/2014 " I stopped using all that"     Allergies   Patient has no known allergies.   Review of Systems Review of Systems  Cardiovascular: Positive for chest pain.  All other systems reviewed and are negative.    Physical Exam Updated Vital Signs BP 126/76 (BP Location: Left Arm)   Pulse 81   Temp 98.5 F (36.9 C) (Oral)   Resp 18   Ht 5\' 7"  (1.702 m)   Wt 74.8 kg   SpO2 100%   BMI 25.84 kg/m   Physical Exam  Constitutional: He is oriented to person, place, and time. He appears well-developed and well-nourished. No distress.  HENT:  Head: Atraumatic.  Eyes: Conjunctivae are normal.  Neck: Neck supple.  Cardiovascular: Normal rate, regular rhythm, intact distal pulses and normal pulses.  Pulmonary/Chest: Effort normal and breath sounds normal.  Abdominal: Soft. There is no tenderness.  Musculoskeletal:       Right lower leg: He exhibits no edema.       Left lower leg: He exhibits no edema.  Neurological: He is alert and oriented to person, place, and time.  Skin: No rash noted.  Psychiatric: He has a normal mood and affect.  Nursing note and vitals reviewed.    ED Treatments / Results  Labs (all labs ordered are listed, but only abnormal results are displayed) Labs Reviewed  BASIC METABOLIC PANEL - Abnormal; Notable for the following components:      Result Value   Glucose, Bld 123 (*)    All other components within normal limits  CBC  I-STAT TROPONIN, ED    EKG None  ED ECG REPORT   Date: 09/02/2018  Rate: 90  Rhythm: normal sinus rhythm  QRS Axis: normal  Intervals: normal  ST/T Wave abnormalities: nonspecific ST/T changes  Conduction Disutrbances:none  Narrative Interpretation:   Old EKG Reviewed: unchanged  I have personally reviewed the EKG tracing and  agree with the computerized printout as noted.   Radiology Dg Chest 2 View  Result Date: 09/02/2018 CLINICAL DATA:  Chest pain. EXAM: CHEST - 2 VIEW COMPARISON:  01/11/2016 FINDINGS: The heart size and mediastinal contours are within normal limits. Both lungs are clear. The visualized skeletal structures are unremarkable. Aortic atherosclerosis. IMPRESSION: No acute abnormalities. Aortic Atherosclerosis (ICD10-I70.0). Electronically Signed   By: Lorriane Shire M.D.   On: 09/02/2018 13:19    Procedures Procedures (including critical care time)  Medications Ordered in ED Medications - No data to display   Initial Impression / Assessment and Plan / ED Course  I have reviewed  the triage vital signs and the nursing notes.  Pertinent labs & imaging results that were available during my care of the patient were reviewed by me and considered in my medical decision making (see chart for details).     BP (!) 150/97   Pulse 81   Temp 98.5 F (36.9 C) (Oral)   Resp 19   Ht 5\' 7"  (1.702 m)   Wt 74.8 kg   SpO2 100%   BMI 25.84 kg/m    Final Clinical Impressions(s) / ED Diagnoses   Final diagnoses:  Atypical chest pain    ED Discharge Orders    None     2:45 PM Patient here with chest pain shortness of breath after having prostate biopsy.  Pain is atypical for ACS. HEART score of 4, which puts him at moderate risk of MACE.  3:54 PM Normal delta troponin.  CXR unremarkable.  Vital sign stable.  Suspect anxiety from recent procedure causing sxs. Vistaril given. Reassurance given.  Pt stable for discharge.  Return precaution discussed.  Care discussed with Dr. Venora Maples.     Domenic Moras, PA-C 09/02/18 1557    Jola Schmidt, MD 09/03/18 2045

## 2018-09-02 NOTE — ED Notes (Signed)
Pt states that he had a Prostate biopsy at the the [facility] in [place] today.  Pt states that while he was driving home he began feeling unwell.  Pt reports generalized chest tightness.  Pt reports nausea some mild sob and feeling lightheaded and his mouth was dry.  Pt is in no distress at this time.

## 2018-12-09 NOTE — Progress Notes (Signed)
GU Location of Tumor / Histology: prostatic adenocarcinoma  If Prostate Cancer, Gleason Score is (3 + 4) and PSA is (9.17). Prostate volume: 53.5 cc.  Theodore Wood reports he had an abnormal DRE at the [facility] in [place] a few years ago. Reports the most prostate biopsy is the only one he has ever had.   Biopsies of prostate (if applicable) revealed:    Past/Anticipated interventions by urology, if any: prostate biopsy, referral for consideration of radiotherapy  Past/Anticipated interventions by medical oncology, if any: no  Weight changes, if any: no  Bowel/Bladder complaints, if any: Denies dysuria, hematuria, urinary leakage or incontinence.    Nausea/Vomiting, if any: no  Pain issues, if any:  no  SAFETY ISSUES:  Prior radiation? no  Pacemaker/ICD? no  Possible current pregnancy? no, male patient  Is the patient on methotrexate? no  Current Complaints / other details:  67 year old male. Seperated. He and a couple of his brothers reside together. Takes the bus to get around [place]. Resides in [place].

## 2018-12-12 ENCOUNTER — Ambulatory Visit
Admission: RE | Admit: 2018-12-12 | Discharge: 2018-12-12 | Disposition: A | Discharge status: Home / Self Care | Payer: Non-veteran care | Source: Ambulatory Visit | Attending: Radiation Oncology | Admitting: Radiation Oncology

## 2018-12-12 ENCOUNTER — Telehealth: Payer: Self-pay | Admitting: Radiation Oncology

## 2018-12-12 ENCOUNTER — Ambulatory Visit: Payer: Non-veteran care

## 2018-12-12 DIAGNOSIS — C61 Malignant neoplasm of prostate: Secondary | ICD-10-CM | POA: Insufficient documentation

## 2018-12-12 NOTE — Telephone Encounter (Signed)
Patient has not shown for 1430 appointment. Phoned to inquire. No answer. Left voicemail message requesting return call to reschedule.

## 2018-12-21 ENCOUNTER — Telehealth: Payer: Self-pay | Admitting: Radiation Oncology

## 2018-12-21 NOTE — Telephone Encounter (Signed)
Patient called and spoke to [place]. He wanted to be seen. I called him back and explained that we had an appt for him on 12/12/2018, but he didn't know about it. Changed phone number in [place]. He is now scheduled to be seen by Dr. Tammi Klippel on 12/30/18.

## 2018-12-30 ENCOUNTER — Encounter: Payer: Self-pay | Admitting: Radiation Oncology

## 2018-12-30 ENCOUNTER — Ambulatory Visit
Admission: RE | Admit: 2018-12-30 | Discharge: 2018-12-30 | Disposition: A | Discharge status: Home / Self Care | Payer: No Typology Code available for payment source | Source: Ambulatory Visit | Attending: Radiation Oncology | Admitting: Radiation Oncology

## 2018-12-30 ENCOUNTER — Encounter: Payer: Self-pay | Admitting: Medical Oncology

## 2018-12-30 ENCOUNTER — Other Ambulatory Visit: Payer: Self-pay

## 2018-12-30 VITALS — BP 151/91 | HR 87 | Temp 98.5°F | Resp 16 | Ht 66.0 in | Wt 172.2 lb

## 2018-12-30 DIAGNOSIS — N529 Male erectile dysfunction, unspecified: Secondary | ICD-10-CM | POA: Diagnosis not present

## 2018-12-30 DIAGNOSIS — Z79899 Other long term (current) drug therapy: Secondary | ICD-10-CM | POA: Diagnosis not present

## 2018-12-30 DIAGNOSIS — Z809 Family history of malignant neoplasm, unspecified: Secondary | ICD-10-CM | POA: Diagnosis not present

## 2018-12-30 DIAGNOSIS — I209 Angina pectoris, unspecified: Secondary | ICD-10-CM | POA: Diagnosis not present

## 2018-12-30 DIAGNOSIS — C61 Malignant neoplasm of prostate: Secondary | ICD-10-CM | POA: Insufficient documentation

## 2018-12-30 DIAGNOSIS — E7800 Pure hypercholesterolemia, unspecified: Secondary | ICD-10-CM | POA: Insufficient documentation

## 2018-12-30 DIAGNOSIS — I10 Essential (primary) hypertension: Secondary | ICD-10-CM | POA: Insufficient documentation

## 2018-12-30 DIAGNOSIS — K219 Gastro-esophageal reflux disease without esophagitis: Secondary | ICD-10-CM | POA: Diagnosis not present

## 2018-12-30 DIAGNOSIS — Z87891 Personal history of nicotine dependence: Secondary | ICD-10-CM | POA: Insufficient documentation

## 2018-12-30 HISTORY — DX: Malignant neoplasm of prostate: C61

## 2018-12-30 NOTE — Progress Notes (Signed)
Radiation Oncology         [phone] ________________________________  Initial outpatient Consultation  Name: Theodore Wood MRN: [id number]  Date: 12/30/2018  DOB: [birth year]  KX:[id number], Provider, MD  No ref. provider found   REFERRING PHYSICIAN: No ref. provider found  DIAGNOSIS: 68 y.o. gentleman with Stage T1c adenocarcinoma of the prostate with Gleason score of 3+4, and PSA of 10.3.    ICD-10-CM   1. Malignant neoplasm of prostate ([place]) C61     HISTORY OF PRESENT ILLNESS: Theodore Wood is a 68 y.o. male with a diagnosis of prostate cancer. He has a longstanding history of elevated PSA, followed by his primary care physician, Dr. Beckey Downing, since 2016.  He has had a gradual rise in PSA over the years and reached 10.3 in 12/2017.  He had not had prior prostate biopsy.  Accordingly, he was referred for evaluation in urology by Dr. Greer Pickerel in September 2019,  digital rectal examination was performed at that time revealing symmetric lobes and no nodules.  The patient proceeded to transrectal ultrasound with 19 biopsies of the prostate on 09/02/18.  The prostate volume measured 53.5 cc.  Out of 19 core biopsies, 10 were positive.  The maximum Gleason score was 3+4, and this was seen in 6 of 10 cores from the right lobe and 4 of 9 cores from the left lobe.  Perineural invasion and HG/PIN were also noted.  The patient reviewed the biopsy results with his urologist and he has kindly been referred today for discussion of potential radiation treatment options. He is most interested in external beam radiation.  PREVIOUS RADIATION THERAPY: No  PAST MEDICAL HISTORY:  Past Medical History:  Diagnosis Date  . Angina   . GERD (gastroesophageal reflux disease)   . High cholesterol   . Hypertension   . Prostate cancer ([place])       PAST SURGICAL HISTORY: Past Surgical History:  Procedure Laterality Date  . CARDIAC CATHETERIZATION  1990's  . PROSTATE BIOPSY      FAMILY HISTORY:  Family  History  Problem Relation Age of Onset  . Stroke Mother   . Stroke Father   . Stroke Brother   . Cancer Maternal Aunt        unknown type    SOCIAL HISTORY:  Social History   Socioeconomic History  . Marital status: Legally Separated    Spouse name: Not on file  . Number of children: 4  . Years of education: Not on file  . Highest education level: Not on file  Occupational History    Comment: retired  Scientific laboratory technician  . Financial resource strain: Not on file  . Food insecurity:    Worry: Not on file    Inability: Not on file  . Transportation needs:    Medical: Not on file    Non-medical: Not on file  Tobacco Use  . Smoking status: Former Smoker    Packs/day: 0.25    Years: 30.00    Pack years: 7.50    Types: Cigarettes  . Smokeless tobacco: Never Used  Substance and Sexual Activity  . Alcohol use: Yes    Alcohol/week: 42.0 standard drinks    Types: 42 Cans of beer per week    Comment: 4 X  a week "a couple of beers"  . Drug use: Yes    Types: Cocaine, Marijuana, Heroin    Comment: 02/03/12 "3 days ago I did cocaine to get the pain off; used heroin years ago"  08/28/2014 " I stopped using all that"  . Sexual activity: Yes    Birth control/protection: Condom  Lifestyle  . Physical activity:    Days per week: Not on file    Minutes per session: Not on file  . Stress: Not on file  Relationships  . Social connections:    Talks on phone: Not on file    Gets together: Not on file    Attends religious service: Not on file    Active member of club or organization: Not on file    Attends meetings of clubs or organizations: Not on file    Relationship status: Not on file  . Intimate partner violence:    Fear of current or ex partner: Not on file    Emotionally abused: Not on file    Physically abused: Not on file    Forced sexual activity: Not on file  Other Topics Concern  . Not on file  Social History Narrative   U[place] [employer] for transportation. Separated from wife.       ALLERGIES: Patient has no known allergies.  MEDICATIONS:  Current Outpatient Medications  Medication Sig Dispense Refill  . lisinopril-hydrochlorothiazide (PRINZIDE,ZESTORETIC) 10-12.5 MG per tablet Take 1 tablet by mouth daily.    Marland Kitchen acetaminophen (TYLENOL) 325 MG tablet Take 2 tablets (650 mg total) by mouth every 6 (six) hours as needed for moderate pain. (Patient not taking: Reported on 12/30/2018) 30 tablet 0  . diclofenac sodium (VOLTAREN) 1 % GEL Apply 2 g topically 4 (four) times daily. (Patient not taking: Reported on 12/30/2018) 100 g 3  . fish oil-omega-3 fatty acids 1000 MG capsule Take 1 g by mouth daily.     . folic acid (FOLVITE) 1 MG tablet Take 1 tablet (1 mg total) by mouth daily. (Patient not taking: Reported on 12/30/2018) 90 tablet 3  . Multiple Vitamin (MULTIVITAMIN WITH MINERALS) TABS tablet Take 1 tablet by mouth daily. (Patient not taking: Reported on 12/30/2018) 90 tablet 4  . nitroGLYCERIN (NITROSTAT) 0.3 MG SL tablet Place 1 tablet (0.3 mg total) under the tongue every 5 (five) minutes as needed for chest pain. (Patient not taking: Reported on 12/30/2018) 30 tablet 12  . oxyCODONE-acetaminophen (PERCOCET/ROXICET) 5-325 MG tablet Take 1 tablet by mouth every 6 (six) hours as needed for severe pain. (Patient not taking: Reported on 12/30/2018) 6 tablet 0  . oxymetazoline (AFRIN) 0.05 % nasal spray Place 1 spray into both nostrils as needed for congestion.    . pantoprazole (PROTONIX) 40 MG tablet Take 1 tablet (40 mg total) by mouth daily. (Patient not taking: Reported on 12/30/2018) 90 tablet 0  . rosuvastatin (CRESTOR) 40 MG tablet Take 1 tablet (40 mg total) by mouth daily at 6 PM. (Patient not taking: Reported on 12/30/2018) 90 tablet 4   No current facility-administered medications for this encounter.     REVIEW OF SYSTEMS:  On review of systems, the patient reports that he is doing well overall. He denies any chest pain, shortness of breath, cough, fevers, chills,  night sweats, unintended weight changes. He denies any bowel disturbances, and denies abdominal pain, nausea or vomiting. He denies any new musculoskeletal or joint aches or pains. His IPSS was 5, indicating mild urinary symptoms. His SHIM was 17, indicating he does have mild erectile dysfunction. A complete review of systems is obtained and is otherwise negative.    PHYSICAL EXAM:  Wt Readings from Last 3 Encounters:  12/30/18 172 lb 3.2 oz (78.1 kg)  09/02/18  165 lb (74.8 kg)  01/14/16 153 lb (69.4 kg)   Temp Readings from Last 3 Encounters:  12/30/18 98.5 F (36.9 C)  09/02/18 98.5 F (36.9 C) (Oral)  11/25/17 98.2 F (36.8 C)   BP Readings from Last 3 Encounters:  12/30/18 (!) 151/91  09/02/18 (!) 161/94  11/25/17 (!) 149/78   Pulse Readings from Last 3 Encounters:  12/30/18 87  09/02/18 81  11/25/17 85   Pain Assessment Pain Score: 0-No pain/10  In general this is a well appearing African American gentleman in no acute distress. He is alert and oriented x4 and appropriate throughout the examination. HEENT reveals that the patient is normocephalic, atraumatic. EOMs are intact. PERRLA. Skin is intact without any evidence of gross lesions. Cardiovascular exam reveals a regular rate and rhythm, no clicks rubs or murmurs are auscultated. Chest is clear to auscultation bilaterally. Lymphatic assessment is performed and does not reveal any adenopathy in the cervical, supraclavicular, axillary, or inguinal chains. Abdomen has active bowel sounds in all quadrants and is intact. The abdomen is soft, non tender, non distended. Lower extremities are negative for pretibial pitting edema, deep calf tenderness, cyanosis or clubbing.   KPS = 100  100 - Normal; no complaints; no evidence of disease. 90   - Able to carry on normal activity; minor signs or symptoms of disease. 80   - Normal activity with effort; some signs or symptoms of disease. 31   - Cares for self; unable to carry on  normal activity or to do active work. 60   - Requires occasional assistance, but is able to care for most of his personal needs. 50   - Requires considerable assistance and frequent medical care. 1   - Disabled; requires special care and assistance. 54   - Severely disabled; hospital admission is indicated although death not imminent. 27   - Very sick; hospital admission necessary; active supportive treatment necessary. 10   - Moribund; fatal processes progressing rapidly. 0     - Dead  Karnofsky DA, Abelmann [place], Craver LS and Burchenal [facility] [phone]) The use of the nitrogen mustards in the palliative treatment of carcinoma: with particular reference to bronchogenic carcinoma Cancer 1 634-56  LABORATORY DATA:  Lab Results  Component Value Date   WBC 10.0 09/02/2018   HGB 15.6 09/02/2018   HCT 45.5 09/02/2018   MCV 89.0 09/02/2018   PLT 198 09/02/2018   Lab Results  Component Value Date   NA 138 09/02/2018   K 3.9 09/02/2018   CL 101 09/02/2018   CO2 26 09/02/2018   Lab Results  Component Value Date   ALT 65 (H) 01/12/2016   AST 71 (H) 01/12/2016   ALKPHOS 82 01/12/2016   BILITOT 0.6 01/12/2016     RADIOGRAPHY: No results found.    IMPRESSION/PLAN: 1. 69 y.o. gentleman with Stage T1c adenocarcinoma of the prostate with Gleason Score of 3+4, and PSA of 9.17. We discussed the patient's workup and outlined the nature of prostate cancer in this setting. The patient's T stage, Gleason's score, and PSA put him into the favorable intermediate risk group. Accordingly, he is eligible for a variety of potential treatment options including brachytherapy or 5.5 weeks of external radiation. We discussed the available radiation techniques, and focused on the details and logistics and delivery. We discussed and outlined the risks, benefits, short and long-term effects associated with radiotherapy and compared and contrasted these with prostatectomy.   At the end of the conversation the patient  is interested in moving forward with 5.5 weeks of external beam therapy. He is scheduled for simulation on 01/04/2019 at 8:30 am. We will share our discussion with Dr. Greer Pickerel and move forward with treatment planning in anticipation of beginning IMRT in the near future.  We spent 20 minutes face to face with the patient and more than 50% of that time was spent in counseling and/or coordination of care.    Nicholos Johns, PA-C    Tyler Pita, MD  [facility] Direct Dial: [phone]  Fax: [phone] [email or web address]  Skype  [email or web address]   This document serves as a record of services personally performed by Tyler Pita, MD. It was created on his behalf by Wilburn Mylar, a trained medical scribe. The creation of this record is based on the scribe's personal observations and the provider's statements to them. This document has been checked and approved by the attending provider.

## 2018-12-30 NOTE — Progress Notes (Signed)
Introduced myself to Theodore Wood as the prostate nurse navigator and my role. He was diagnosed at the [place] but referred here to discuss radiation treatment options to be closer to home. He does not know of any family history of prostate cancer. He lives with his brothers and uses the city bus for transportation. I gave him my business card and asked him to call me with concerns and questions. He voiced understanding.

## 2018-12-30 NOTE — Progress Notes (Signed)
See progress note under physician encounter. 

## 2019-01-04 ENCOUNTER — Ambulatory Visit
Admission: RE | Admit: 2019-01-04 | Discharge: 2019-01-04 | Disposition: A | Discharge status: Home / Self Care | Payer: No Typology Code available for payment source | Source: Ambulatory Visit | Attending: Radiation Oncology | Admitting: Radiation Oncology

## 2019-01-04 DIAGNOSIS — C61 Malignant neoplasm of prostate: Secondary | ICD-10-CM | POA: Diagnosis not present

## 2019-01-04 DIAGNOSIS — Z510 Encounter for antineoplastic radiation therapy: Secondary | ICD-10-CM | POA: Insufficient documentation

## 2019-01-07 NOTE — Progress Notes (Signed)
  Radiation Oncology         [phone] ________________________________  Name: Theodore Wood MRN: [id number]  Date: 01/04/2019  DOB: 12-20-1950  SIMULATION AND TREATMENT PLANNING NOTE    ICD-10-CM   1. Malignant neoplasm of prostate ([place]) C61     DIAGNOSIS:  68 y.o. gentleman with Stage T1c adenocarcinoma of the prostate with Gleason score of 3+4, and PSA of 10.3.  NARRATIVE:  The patient was brought to the [facility].  Identity was confirmed.  All relevant records and images related to the planned course of therapy were reviewed.  The patient freely provided informed written consent to proceed with treatment after reviewing the details related to the planned course of therapy. The consent form was witnessed and verified by the simulation staff.  Then, the patient was set-up in a stable reproducible supine position for radiation therapy.  A vacuum lock pillow device was custom fabricated to position his legs in a reproducible immobilized position.  Then, I performed a urethrogram under sterile conditions to identify the prostatic apex.  CT images were obtained.  Surface markings were placed.  The CT images were loaded into the planning software.  Then the prostate target and avoidance structures including the rectum, bladder, bowel and hips were contoured.  Treatment planning then occurred.  The radiation prescription was entered and confirmed.  A total of one complex treatment devices was fabricated. I have requested : Intensity Modulated Radiotherapy (IMRT) is medically necessary for this case for the following reason:  Rectal sparing.Marland Kitchen  PLAN:  The patient will receive 70 Gy in 28 fractions.  ________________________________  Sheral Apley Tammi Klippel, M.D.

## 2019-01-10 DIAGNOSIS — Z510 Encounter for antineoplastic radiation therapy: Secondary | ICD-10-CM | POA: Diagnosis not present

## 2019-01-16 ENCOUNTER — Ambulatory Visit
Admission: RE | Admit: 2019-01-16 | Discharge: 2019-01-16 | Disposition: A | Discharge status: Home / Self Care | Payer: No Typology Code available for payment source | Source: Ambulatory Visit | Attending: Radiation Oncology | Admitting: Radiation Oncology

## 2019-01-16 DIAGNOSIS — C61 Malignant neoplasm of prostate: Secondary | ICD-10-CM | POA: Diagnosis not present

## 2019-01-16 DIAGNOSIS — Z510 Encounter for antineoplastic radiation therapy: Secondary | ICD-10-CM | POA: Insufficient documentation

## 2019-01-17 ENCOUNTER — Ambulatory Visit
Admission: RE | Admit: 2019-01-17 | Discharge: 2019-01-17 | Disposition: A | Discharge status: Home / Self Care | Payer: No Typology Code available for payment source | Source: Ambulatory Visit | Attending: Radiation Oncology | Admitting: Radiation Oncology

## 2019-01-17 DIAGNOSIS — Z510 Encounter for antineoplastic radiation therapy: Secondary | ICD-10-CM | POA: Diagnosis not present

## 2019-01-18 ENCOUNTER — Ambulatory Visit
Admission: RE | Admit: 2019-01-18 | Discharge: 2019-01-18 | Disposition: A | Discharge status: Home / Self Care | Payer: No Typology Code available for payment source | Source: Ambulatory Visit | Attending: Radiation Oncology | Admitting: Radiation Oncology

## 2019-01-18 DIAGNOSIS — Z510 Encounter for antineoplastic radiation therapy: Secondary | ICD-10-CM | POA: Diagnosis not present

## 2019-01-19 ENCOUNTER — Ambulatory Visit
Admission: RE | Admit: 2019-01-19 | Discharge: 2019-01-19 | Disposition: A | Discharge status: Home / Self Care | Payer: No Typology Code available for payment source | Source: Ambulatory Visit | Attending: Radiation Oncology | Admitting: Radiation Oncology

## 2019-01-19 DIAGNOSIS — Z510 Encounter for antineoplastic radiation therapy: Secondary | ICD-10-CM | POA: Diagnosis not present

## 2019-01-20 ENCOUNTER — Ambulatory Visit
Admission: RE | Admit: 2019-01-20 | Discharge: 2019-01-20 | Disposition: A | Discharge status: Home / Self Care | Payer: No Typology Code available for payment source | Source: Ambulatory Visit | Attending: Radiation Oncology | Admitting: Radiation Oncology

## 2019-01-20 DIAGNOSIS — Z510 Encounter for antineoplastic radiation therapy: Secondary | ICD-10-CM | POA: Diagnosis not present

## 2019-01-23 ENCOUNTER — Ambulatory Visit
Admission: RE | Admit: 2019-01-23 | Discharge: 2019-01-23 | Disposition: A | Discharge status: Home / Self Care | Payer: No Typology Code available for payment source | Source: Ambulatory Visit | Attending: Radiation Oncology | Admitting: Radiation Oncology

## 2019-01-23 DIAGNOSIS — Z510 Encounter for antineoplastic radiation therapy: Secondary | ICD-10-CM | POA: Diagnosis not present

## 2019-01-24 ENCOUNTER — Ambulatory Visit
Admission: RE | Admit: 2019-01-24 | Discharge: 2019-01-24 | Disposition: A | Discharge status: Home / Self Care | Payer: No Typology Code available for payment source | Source: Ambulatory Visit | Attending: Radiation Oncology | Admitting: Radiation Oncology

## 2019-01-24 DIAGNOSIS — Z510 Encounter for antineoplastic radiation therapy: Secondary | ICD-10-CM | POA: Diagnosis not present

## 2019-01-25 ENCOUNTER — Ambulatory Visit
Admission: RE | Admit: 2019-01-25 | Discharge: 2019-01-25 | Disposition: A | Discharge status: Home / Self Care | Payer: No Typology Code available for payment source | Source: Ambulatory Visit | Attending: Radiation Oncology | Admitting: Radiation Oncology

## 2019-01-25 DIAGNOSIS — Z510 Encounter for antineoplastic radiation therapy: Secondary | ICD-10-CM | POA: Diagnosis not present

## 2019-01-27 ENCOUNTER — Ambulatory Visit
Admission: RE | Admit: 2019-01-27 | Discharge: 2019-01-27 | Disposition: A | Discharge status: Home / Self Care | Payer: No Typology Code available for payment source | Source: Ambulatory Visit | Attending: Radiation Oncology | Admitting: Radiation Oncology

## 2019-01-27 DIAGNOSIS — Z510 Encounter for antineoplastic radiation therapy: Secondary | ICD-10-CM | POA: Diagnosis not present

## 2019-01-30 ENCOUNTER — Ambulatory Visit
Admission: RE | Admit: 2019-01-30 | Discharge: 2019-01-30 | Disposition: A | Discharge status: Home / Self Care | Payer: No Typology Code available for payment source | Source: Ambulatory Visit | Attending: Radiation Oncology | Admitting: Radiation Oncology

## 2019-01-30 DIAGNOSIS — Z510 Encounter for antineoplastic radiation therapy: Secondary | ICD-10-CM | POA: Diagnosis not present

## 2019-01-31 ENCOUNTER — Ambulatory Visit
Admission: RE | Admit: 2019-01-31 | Discharge: 2019-01-31 | Disposition: A | Discharge status: Home / Self Care | Payer: No Typology Code available for payment source | Source: Ambulatory Visit | Attending: Radiation Oncology | Admitting: Radiation Oncology

## 2019-01-31 DIAGNOSIS — Z510 Encounter for antineoplastic radiation therapy: Secondary | ICD-10-CM | POA: Diagnosis not present

## 2019-02-01 ENCOUNTER — Ambulatory Visit
Admission: RE | Admit: 2019-02-01 | Discharge: 2019-02-01 | Disposition: A | Discharge status: Home / Self Care | Payer: No Typology Code available for payment source | Source: Ambulatory Visit | Attending: Radiation Oncology | Admitting: Radiation Oncology

## 2019-02-01 DIAGNOSIS — Z510 Encounter for antineoplastic radiation therapy: Secondary | ICD-10-CM | POA: Diagnosis not present

## 2019-02-02 ENCOUNTER — Ambulatory Visit
Admission: RE | Admit: 2019-02-02 | Discharge: 2019-02-02 | Disposition: A | Discharge status: Home / Self Care | Payer: No Typology Code available for payment source | Source: Ambulatory Visit | Attending: Radiation Oncology | Admitting: Radiation Oncology

## 2019-02-02 DIAGNOSIS — Z510 Encounter for antineoplastic radiation therapy: Secondary | ICD-10-CM | POA: Diagnosis not present

## 2019-02-03 ENCOUNTER — Ambulatory Visit
Admission: RE | Admit: 2019-02-03 | Discharge: 2019-02-03 | Disposition: A | Discharge status: Home / Self Care | Payer: No Typology Code available for payment source | Source: Ambulatory Visit | Attending: Radiation Oncology | Admitting: Radiation Oncology

## 2019-02-03 ENCOUNTER — Encounter: Payer: Self-pay | Admitting: *Deleted

## 2019-02-03 DIAGNOSIS — Z510 Encounter for antineoplastic radiation therapy: Secondary | ICD-10-CM | POA: Diagnosis not present

## 2019-02-06 ENCOUNTER — Ambulatory Visit
Admission: RE | Admit: 2019-02-06 | Discharge: 2019-02-06 | Disposition: A | Discharge status: Home / Self Care | Payer: No Typology Code available for payment source | Source: Ambulatory Visit | Attending: Radiation Oncology | Admitting: Radiation Oncology

## 2019-02-06 DIAGNOSIS — Z510 Encounter for antineoplastic radiation therapy: Secondary | ICD-10-CM | POA: Diagnosis not present

## 2019-02-06 NOTE — Progress Notes (Signed)
CSW received referral, patient is in need of bus passes.  Patient plans to come by [place] office to discuss transportation resources and obtain bus pass.  Maryjean Morn, MSW, LCSW, OSW-C Clinical Social Worker [facility] [phone]

## 2019-02-07 ENCOUNTER — Ambulatory Visit
Admission: RE | Admit: 2019-02-07 | Discharge: 2019-02-07 | Disposition: A | Discharge status: Home / Self Care | Payer: No Typology Code available for payment source | Source: Ambulatory Visit | Attending: Radiation Oncology | Admitting: Radiation Oncology

## 2019-02-07 DIAGNOSIS — Z510 Encounter for antineoplastic radiation therapy: Secondary | ICD-10-CM | POA: Diagnosis not present

## 2019-02-08 ENCOUNTER — Ambulatory Visit
Admission: RE | Admit: 2019-02-08 | Discharge: 2019-02-08 | Disposition: A | Discharge status: Home / Self Care | Payer: No Typology Code available for payment source | Source: Ambulatory Visit | Attending: Radiation Oncology | Admitting: Radiation Oncology

## 2019-02-08 DIAGNOSIS — Z510 Encounter for antineoplastic radiation therapy: Secondary | ICD-10-CM | POA: Diagnosis not present

## 2019-02-09 ENCOUNTER — Ambulatory Visit
Admission: RE | Admit: 2019-02-09 | Discharge: 2019-02-09 | Disposition: A | Discharge status: Home / Self Care | Payer: No Typology Code available for payment source | Source: Ambulatory Visit | Attending: Radiation Oncology | Admitting: Radiation Oncology

## 2019-02-09 DIAGNOSIS — Z510 Encounter for antineoplastic radiation therapy: Secondary | ICD-10-CM | POA: Diagnosis not present

## 2019-02-10 ENCOUNTER — Ambulatory Visit
Admission: RE | Admit: 2019-02-10 | Discharge: 2019-02-10 | Disposition: A | Discharge status: Home / Self Care | Payer: No Typology Code available for payment source | Source: Ambulatory Visit | Attending: Radiation Oncology | Admitting: Radiation Oncology

## 2019-02-10 DIAGNOSIS — Z510 Encounter for antineoplastic radiation therapy: Secondary | ICD-10-CM | POA: Diagnosis not present

## 2019-02-13 ENCOUNTER — Ambulatory Visit
Admission: RE | Admit: 2019-02-13 | Discharge: 2019-02-13 | Disposition: A | Discharge status: Home / Self Care | Payer: No Typology Code available for payment source | Source: Ambulatory Visit | Attending: Radiation Oncology | Admitting: Radiation Oncology

## 2019-02-13 DIAGNOSIS — C61 Malignant neoplasm of prostate: Secondary | ICD-10-CM | POA: Diagnosis not present

## 2019-02-13 DIAGNOSIS — Z510 Encounter for antineoplastic radiation therapy: Secondary | ICD-10-CM | POA: Diagnosis not present

## 2019-02-14 ENCOUNTER — Ambulatory Visit
Admission: RE | Admit: 2019-02-14 | Discharge: 2019-02-14 | Disposition: A | Discharge status: Home / Self Care | Payer: No Typology Code available for payment source | Source: Ambulatory Visit | Attending: Radiation Oncology | Admitting: Radiation Oncology

## 2019-02-14 DIAGNOSIS — Z510 Encounter for antineoplastic radiation therapy: Secondary | ICD-10-CM | POA: Diagnosis not present

## 2019-02-15 ENCOUNTER — Ambulatory Visit
Admission: RE | Admit: 2019-02-15 | Discharge: 2019-02-15 | Disposition: A | Discharge status: Home / Self Care | Payer: No Typology Code available for payment source | Source: Ambulatory Visit | Attending: Radiation Oncology | Admitting: Radiation Oncology

## 2019-02-15 DIAGNOSIS — Z510 Encounter for antineoplastic radiation therapy: Secondary | ICD-10-CM | POA: Diagnosis not present

## 2019-02-16 ENCOUNTER — Ambulatory Visit
Admission: RE | Admit: 2019-02-16 | Discharge: 2019-02-16 | Disposition: A | Discharge status: Home / Self Care | Payer: No Typology Code available for payment source | Source: Ambulatory Visit | Attending: Radiation Oncology | Admitting: Radiation Oncology

## 2019-02-16 DIAGNOSIS — Z510 Encounter for antineoplastic radiation therapy: Secondary | ICD-10-CM | POA: Diagnosis not present

## 2019-02-17 ENCOUNTER — Ambulatory Visit
Admission: RE | Admit: 2019-02-17 | Discharge: 2019-02-17 | Disposition: A | Discharge status: Home / Self Care | Payer: No Typology Code available for payment source | Source: Ambulatory Visit | Attending: Radiation Oncology | Admitting: Radiation Oncology

## 2019-02-17 DIAGNOSIS — Z510 Encounter for antineoplastic radiation therapy: Secondary | ICD-10-CM | POA: Diagnosis not present

## 2019-02-20 ENCOUNTER — Ambulatory Visit
Admission: RE | Admit: 2019-02-20 | Discharge: 2019-02-20 | Disposition: A | Discharge status: Home / Self Care | Payer: No Typology Code available for payment source | Source: Ambulatory Visit | Attending: Radiation Oncology | Admitting: Radiation Oncology

## 2019-02-20 ENCOUNTER — Encounter: Payer: Self-pay | Admitting: Medical Oncology

## 2019-02-20 DIAGNOSIS — Z510 Encounter for antineoplastic radiation therapy: Secondary | ICD-10-CM | POA: Diagnosis not present

## 2019-02-21 ENCOUNTER — Ambulatory Visit
Admission: RE | Admit: 2019-02-21 | Discharge: 2019-02-21 | Disposition: A | Discharge status: Home / Self Care | Payer: No Typology Code available for payment source | Source: Ambulatory Visit | Attending: Radiation Oncology | Admitting: Radiation Oncology

## 2019-02-21 DIAGNOSIS — Z510 Encounter for antineoplastic radiation therapy: Secondary | ICD-10-CM | POA: Diagnosis not present

## 2019-02-22 ENCOUNTER — Ambulatory Visit
Admission: RE | Admit: 2019-02-22 | Discharge: 2019-02-22 | Disposition: A | Discharge status: Home / Self Care | Payer: No Typology Code available for payment source | Source: Ambulatory Visit | Attending: Radiation Oncology | Admitting: Radiation Oncology

## 2019-02-22 ENCOUNTER — Ambulatory Visit: Payer: No Typology Code available for payment source

## 2019-02-22 DIAGNOSIS — Z510 Encounter for antineoplastic radiation therapy: Secondary | ICD-10-CM | POA: Diagnosis not present

## 2019-02-23 ENCOUNTER — Encounter: Payer: Self-pay | Admitting: Medical Oncology

## 2019-02-23 ENCOUNTER — Encounter: Payer: Self-pay | Admitting: Radiation Oncology

## 2019-02-23 ENCOUNTER — Ambulatory Visit
Admission: RE | Admit: 2019-02-23 | Discharge: 2019-02-23 | Disposition: A | Discharge status: Home / Self Care | Payer: No Typology Code available for payment source | Source: Ambulatory Visit | Attending: Radiation Oncology | Admitting: Radiation Oncology

## 2019-02-23 DIAGNOSIS — Z510 Encounter for antineoplastic radiation therapy: Secondary | ICD-10-CM | POA: Diagnosis not present

## 2019-03-07 NOTE — Progress Notes (Signed)
  Radiation Oncology         [phone] ________________________________  Name: Theodore Wood MRN: [id number]  Date: 02/23/2019  DOB: 07/22/1951  End of Treatment Note  Diagnosis:   68 y.o. male with Stage T1c adenocarcinoma of the prostate with Gleason score of 3+4, and PSA of 10.3     Indication for treatment:  Curative, Definitive Radiotherapy       Radiation treatment dates:   01/16/2019 - 02/23/2019  Site/dose:   The prostate was treated to 70 Gy in 28 fractions of 2.5 Gy  Beams/energy:   The patient was treated with IMRT using volumetric arc therapy delivering 6 MV X-rays to clockwise and counterclockwise circumferential arcs with a 90 degree collimator offset to avoid dose scalloping.  Image guidance was performed with daily cone beam CT prior to each fraction to align to gold markers in the prostate and assure proper bladder and rectal fill volumes.  Immobilization was achieved with Body[employer] custom mold.  Narrative: The patient tolerated radiation treatment relatively well.   He experienced some minor urinary irritation with intermittency, incomplete emptying, nocturia x2, and dysuria. He denied gross hematuria, bowel issues or significant fatigue.   Plan: The patient has completed radiation treatment. He will return to radiation oncology clinic for routine followup in one month. I advised him to call or return sooner if he has any questions or concerns related to his recovery or treatment. ________________________________  Sheral Apley. Tammi Klippel, M.D.  This document serves as a record of services personally performed by Tyler Pita, MD. It was created on his behalf by Rae Lips, a trained medical scribe. The creation of this record is based on the scribe's personal observations and the provider's statements to them. This document has been checked and approved by the attending provider.

## 2019-03-10 ENCOUNTER — Ambulatory Visit: Payer: No Typology Code available for payment source

## 2019-03-22 ENCOUNTER — Telehealth: Payer: Self-pay | Admitting: *Deleted

## 2019-03-22 NOTE — Telephone Encounter (Signed)
[facility]

## 2019-04-05 ENCOUNTER — Ambulatory Visit: Payer: Medicare Other | Admitting: Urology

## 2019-04-05 ENCOUNTER — Telehealth: Payer: Self-pay | Admitting: Urology

## 2019-04-05 NOTE — Telephone Encounter (Signed)
Radiation Oncology         [phone] ________________________________  Name: ELLSWORTH WALDSCHMIDT MRN: [id number]  Date: 04/05/2019  DOB: 22-Feb-1951  Post Treatment Note  CC: Patient, No Pcp Per  No ref. provider found  Diagnosis:   68 y.o. male with Stage T1c adenocarcinoma of the prostate with Gleason score of 3+4, and PSA of 10.3     Interval Since Last Radiation:  5.5 weeks  01/16/2019 - 02/23/2019:   The prostate was treated to 70 Gy in 28 fractions of 2.5 Gy  Narrative:  I spoke with the patient to conduct his routine scheduled 1 month follow up visit via telephone to spare the patient unnecessary potential exposure in the healthcare setting during the current COVID-19 pandemic.  The patient was notified in advance and gave permission to proceed with this visit format.   He tolerated radiation treatment relatively well.   He experienced some minor urinary irritation with intermittency, incomplete emptying, nocturia x2, and dysuria. He denied gross hematuria, bowel issues or significant fatigue.                              On review of systems, the patient states that he is doing very well overall.  He continues with mild, increased LUTS with daytime frequency, nocturia and urgency.  He specifically denies dysuria, gross hematuria, straining to void, incomplete bladder emptying or incontinence.  He reports a healthy appetite and is maintaining his weight.  He denies any abdominal pain, nausea, vomiting, diarrhea or constipation and denies any significant impact on his energy level.  Overall, he is quite pleased with his progress to date.  ALLERGIES:  has No Known Allergies.  Meds: Current Outpatient Medications  Medication Sig Dispense Refill  . acetaminophen (TYLENOL) 325 MG tablet Take 2 tablets (650 mg total) by mouth every 6 (six) hours as needed for moderate pain. (Patient not taking: Reported on 12/30/2018) 30 tablet 0  . diclofenac sodium (VOLTAREN) 1 % GEL Apply 2 g topically 4  (four) times daily. (Patient not taking: Reported on 12/30/2018) 100 g 3  . fish oil-omega-3 fatty acids 1000 MG capsule Take 1 g by mouth daily.     . folic acid (FOLVITE) 1 MG tablet Take 1 tablet (1 mg total) by mouth daily. (Patient not taking: Reported on 12/30/2018) 90 tablet 3  . lisinopril-hydrochlorothiazide (PRINZIDE,ZESTORETIC) 10-12.5 MG per tablet Take 1 tablet by mouth daily.    . Multiple Vitamin (MULTIVITAMIN WITH MINERALS) TABS tablet Take 1 tablet by mouth daily. (Patient not taking: Reported on 12/30/2018) 90 tablet 4  . nitroGLYCERIN (NITROSTAT) 0.3 MG SL tablet Place 1 tablet (0.3 mg total) under the tongue every 5 (five) minutes as needed for chest pain. (Patient not taking: Reported on 12/30/2018) 30 tablet 12  . oxyCODONE-acetaminophen (PERCOCET/ROXICET) 5-325 MG tablet Take 1 tablet by mouth every 6 (six) hours as needed for severe pain. (Patient not taking: Reported on 12/30/2018) 6 tablet 0  . oxymetazoline (AFRIN) 0.05 % nasal spray Place 1 spray into both nostrils as needed for congestion.    . pantoprazole (PROTONIX) 40 MG tablet Take 1 tablet (40 mg total) by mouth daily. (Patient not taking: Reported on 12/30/2018) 90 tablet 0  . rosuvastatin (CRESTOR) 40 MG tablet Take 1 tablet (40 mg total) by mouth daily at 6 PM. (Patient not taking: Reported on 12/30/2018) 90 tablet 4   No current facility-administered medications for this visit.  Physical Findings: Unable to assess due to telephone visit format.  Lab Findings: Lab Results  Component Value Date   WBC 10.0 09/02/2018   HGB 15.6 09/02/2018   HCT 45.5 09/02/2018   MCV 89.0 09/02/2018   PLT 198 09/02/2018     Radiographic Findings: No results found.  Impression/Plan: 1. 68 y.o. male with Stage T1c adenocarcinoma of the prostate with Gleason score of 3+4, and PSA of 10.3. He will continue to follow up with urology at the [facility] for ongoing PSA determinations.  He does not currently have an appointment  scheduled with Dr. Greer Pickerel but anticipates a follow-up visit around June or July 2020. He understands what to expect with regards to PSA monitoring going forward. I will look forward to following his response to treatment via correspondence with urology, and would be happy to continue to participate in his care if clinically indicated. I talked to the patient about what to expect in the future, including his risk for erectile dysfunction and rectal bleeding. I encouraged him to call or return to the office if he has any questions regarding his previous radiation or possible radiation side effects. He was comfortable with this plan and will follow up as needed.       Nicholos Johns, PA-C

## 2019-09-10 IMAGING — CR DG CHEST 2V
2 series · 2 of 2 positions shown · non-contrast
Comparison: 01/11/2016

CLINICAL DATA: Chest pain.

EXAM:
CHEST - 2 VIEW

[chest pa]
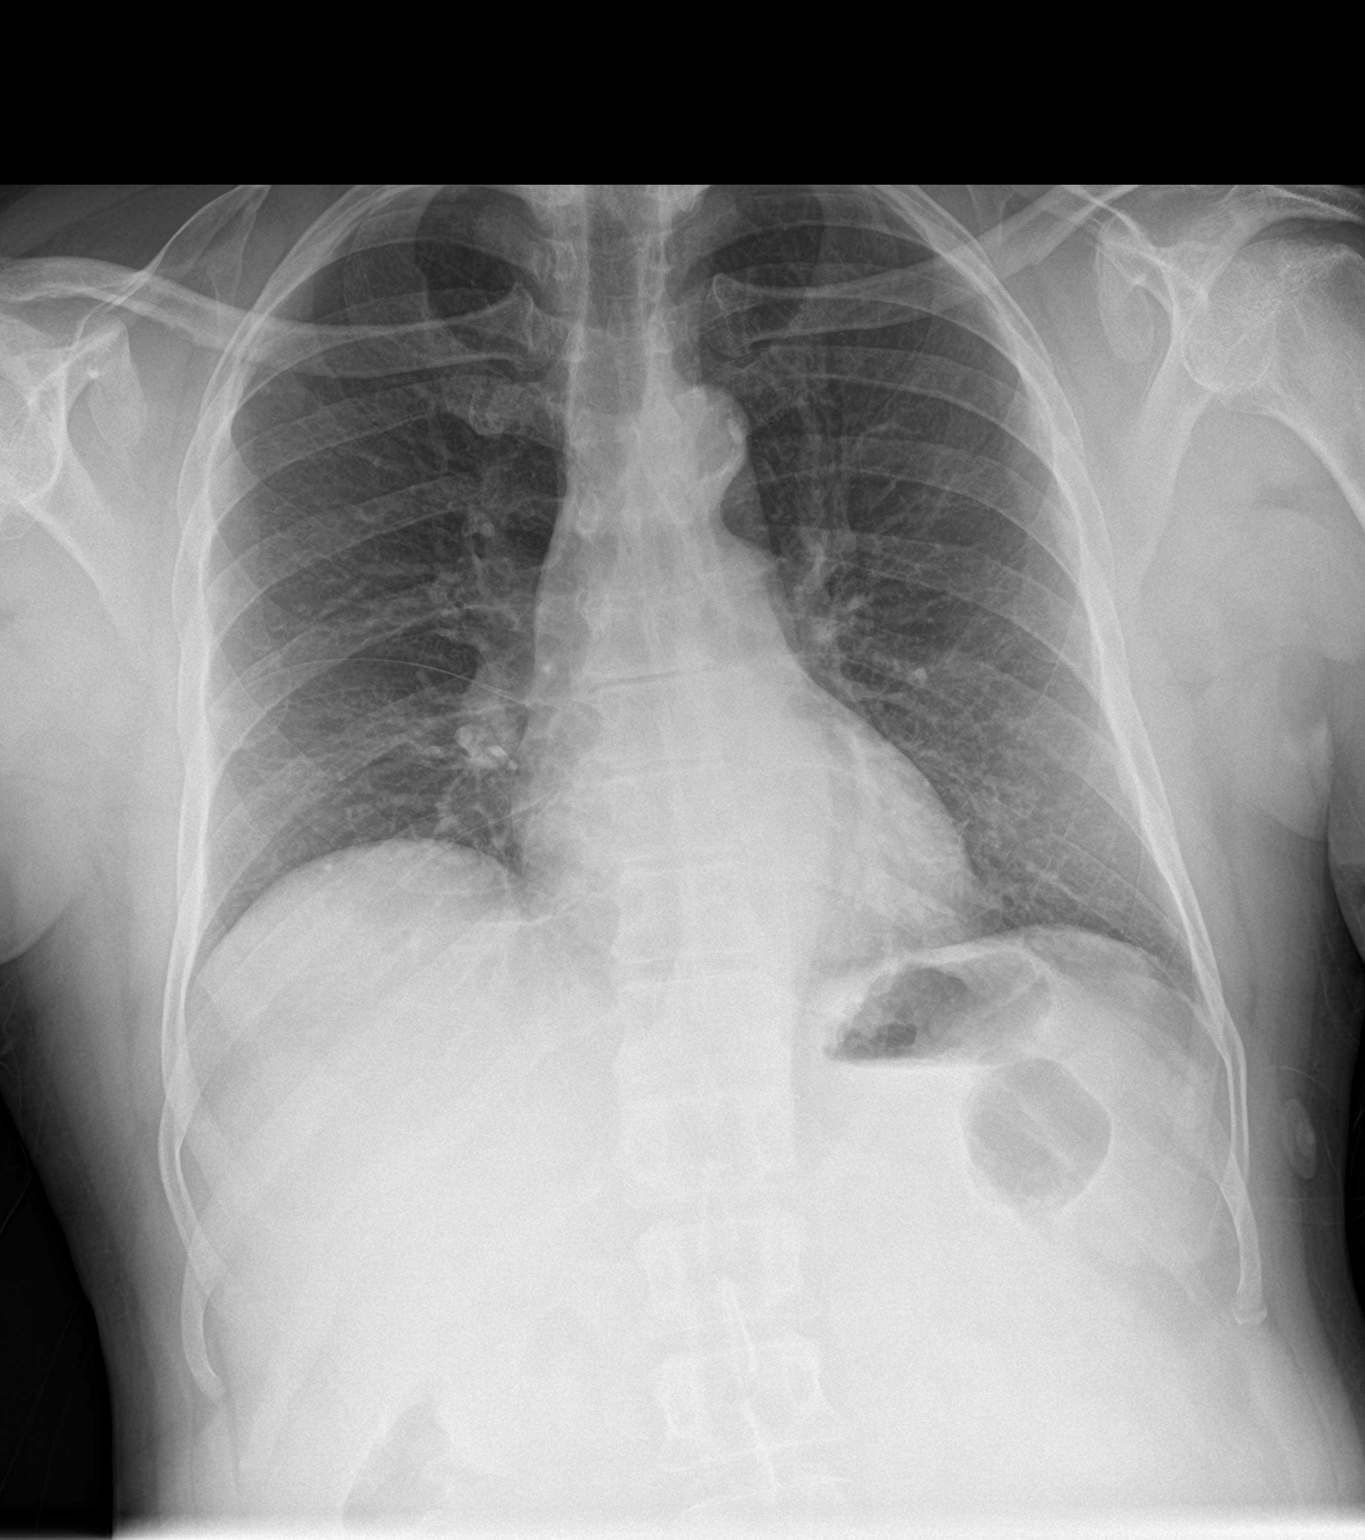

[chest lat]
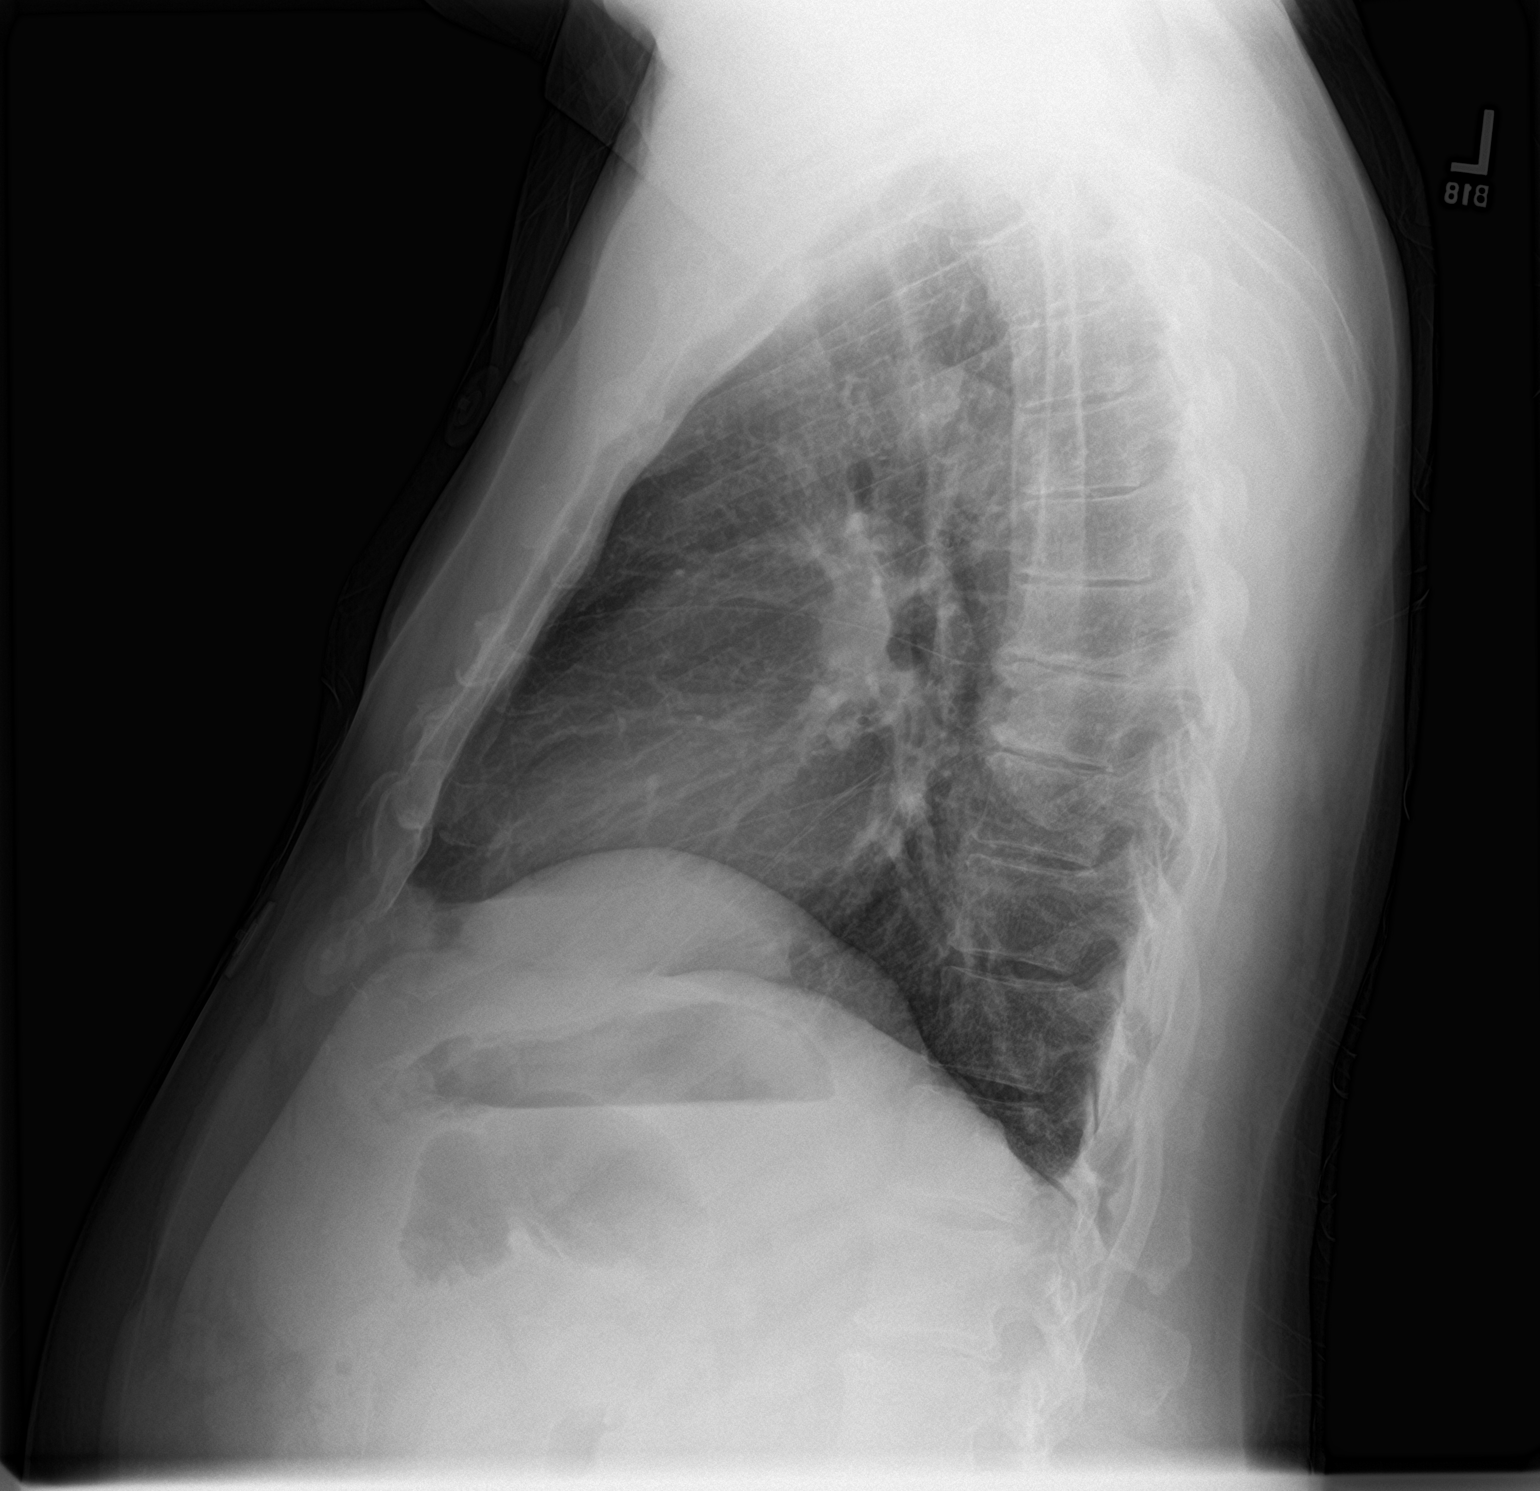

[2 of 2 positions shown; findings below may reference images not displayed]

FINDINGS: The heart size and mediastinal contours are within normal limits.
Both lungs are clear. The visualized skeletal structures are
unremarkable.

Aortic atherosclerosis.
IMPRESSION: No acute abnormalities.

Aortic Atherosclerosis ([id number].I).

## 2020-02-13 ENCOUNTER — Encounter (HOSPITAL_COMMUNITY): Payer: Self-pay

## 2020-02-13 ENCOUNTER — Inpatient Hospital Stay (HOSPITAL_COMMUNITY)
Admission: EM | Admit: 2020-02-13 | Discharge: 2020-02-15 | DRG: 638 | Disposition: A | Discharge status: Home / Self Care | Payer: No Typology Code available for payment source | Attending: Internal Medicine | Admitting: Internal Medicine

## 2020-02-13 ENCOUNTER — Other Ambulatory Visit: Payer: Self-pay

## 2020-02-13 DIAGNOSIS — E871 Hypo-osmolality and hyponatremia: Secondary | ICD-10-CM

## 2020-02-13 DIAGNOSIS — Z79899 Other long term (current) drug therapy: Secondary | ICD-10-CM | POA: Diagnosis not present

## 2020-02-13 DIAGNOSIS — E781 Pure hyperglyceridemia: Secondary | ICD-10-CM

## 2020-02-13 DIAGNOSIS — E1110 Type 2 diabetes mellitus with ketoacidosis without coma: Secondary | ICD-10-CM | POA: Diagnosis present

## 2020-02-13 DIAGNOSIS — Z823 Family history of stroke: Secondary | ICD-10-CM

## 2020-02-13 DIAGNOSIS — K921 Melena: Secondary | ICD-10-CM | POA: Diagnosis present

## 2020-02-13 DIAGNOSIS — F17210 Nicotine dependence, cigarettes, uncomplicated: Secondary | ICD-10-CM

## 2020-02-13 DIAGNOSIS — Z23 Encounter for immunization: Secondary | ICD-10-CM

## 2020-02-13 DIAGNOSIS — D649 Anemia, unspecified: Secondary | ICD-10-CM | POA: Diagnosis not present

## 2020-02-13 DIAGNOSIS — Z20822 Contact with and (suspected) exposure to covid-19: Secondary | ICD-10-CM | POA: Diagnosis present

## 2020-02-13 DIAGNOSIS — K219 Gastro-esophageal reflux disease without esophagitis: Secondary | ICD-10-CM | POA: Diagnosis present

## 2020-02-13 DIAGNOSIS — D638 Anemia in other chronic diseases classified elsewhere: Secondary | ICD-10-CM | POA: Diagnosis not present

## 2020-02-13 DIAGNOSIS — I10 Essential (primary) hypertension: Secondary | ICD-10-CM | POA: Diagnosis present

## 2020-02-13 DIAGNOSIS — E119 Type 2 diabetes mellitus without complications: Secondary | ICD-10-CM

## 2020-02-13 DIAGNOSIS — K922 Gastrointestinal hemorrhage, unspecified: Secondary | ICD-10-CM

## 2020-02-13 DIAGNOSIS — Z809 Family history of malignant neoplasm, unspecified: Secondary | ICD-10-CM | POA: Diagnosis not present

## 2020-02-13 DIAGNOSIS — E7800 Pure hypercholesterolemia, unspecified: Secondary | ICD-10-CM | POA: Diagnosis present

## 2020-02-13 DIAGNOSIS — E785 Hyperlipidemia, unspecified: Secondary | ICD-10-CM | POA: Diagnosis present

## 2020-02-13 DIAGNOSIS — Z720 Tobacco use: Secondary | ICD-10-CM | POA: Diagnosis present

## 2020-02-13 DIAGNOSIS — Z8546 Personal history of malignant neoplasm of prostate: Secondary | ICD-10-CM

## 2020-02-13 DIAGNOSIS — E1165 Type 2 diabetes mellitus with hyperglycemia: Secondary | ICD-10-CM | POA: Insufficient documentation

## 2020-02-13 DIAGNOSIS — Z923 Personal history of irradiation: Secondary | ICD-10-CM

## 2020-02-13 DIAGNOSIS — E1310 Other specified diabetes mellitus with ketoacidosis without coma: Secondary | ICD-10-CM | POA: Diagnosis present

## 2020-02-13 LAB — BASIC METABOLIC PANEL
Anion gap: 19 — ABNORMAL HIGH (ref 5–15)
Anion gap: 15 (ref 5–15)
Anion gap: 13 (ref 5–15)
Anion gap: 9 (ref 5–15)
BUN: 20 mg/dL (ref 8–23)
BUN: 17 mg/dL (ref 8–23)
BUN: 13 mg/dL (ref 8–23)
BUN: 10 mg/dL (ref 8–23)
CO2: 22 mmol/L (ref 22–32)
CO2: 24 mmol/L (ref 22–32)
CO2: 26 mmol/L (ref 22–32)
CO2: 26 mmol/L (ref 22–32)
Calcium: 10 mg/dL (ref 8.9–10.3)
Calcium: 9.7 mg/dL (ref 8.9–10.3)
Calcium: 9.5 mg/dL (ref 8.9–10.3)
Calcium: 9.1 mg/dL (ref 8.9–10.3)
Chloride: 87 mmol/L — ABNORMAL LOW (ref 98–111)
Chloride: 94 mmol/L — ABNORMAL LOW (ref 98–111)
Chloride: 94 mmol/L — ABNORMAL LOW (ref 98–111)
Chloride: 100 mmol/L (ref 98–111)
Creatinine, Ser: 1.23 mg/dL (ref 0.61–1.24)
Creatinine, Ser: 1 mg/dL (ref 0.61–1.24)
Creatinine, Ser: 0.84 mg/dL (ref 0.61–1.24)
Creatinine, Ser: 0.68 mg/dL (ref 0.61–1.24)
GFR calc Af Amer: >60 mL/min (ref >60)
GFR calc Af Amer: >60 mL/min (ref >60)
GFR calc Af Amer: >60 mL/min (ref >60)
GFR calc Af Amer: >60 mL/min (ref >60)
GFR calc non Af Amer: >60 mL/min (ref >60)
GFR calc non Af Amer: >60 mL/min (ref >60)
GFR calc non Af Amer: >60 mL/min (ref >60)
GFR calc non Af Amer: >60 mL/min (ref >60)
Glucose, Bld: 650 mg/dL (ref 70–99)
Glucose, Bld: 252 mg/dL — ABNORMAL HIGH (ref 70–99)
Glucose, Bld: 161 mg/dL — ABNORMAL HIGH (ref 70–99)
Glucose, Bld: 102 mg/dL — ABNORMAL HIGH (ref 70–99)
Potassium: 4.1 mmol/L (ref 3.5–5.1)
Potassium: 3.4 mmol/L — ABNORMAL LOW (ref 3.5–5.1)
Potassium: 3.6 mmol/L (ref 3.5–5.1)
Potassium: 2.9 mmol/L — ABNORMAL LOW (ref 3.5–5.1)
Sodium: 128 mmol/L — ABNORMAL LOW (ref 135–145)
Sodium: 133 mmol/L — ABNORMAL LOW (ref 135–145)
Sodium: 133 mmol/L — ABNORMAL LOW (ref 135–145)
Sodium: 135 mmol/L (ref 135–145)

## 2020-02-13 LAB — GLUCOSE, CAPILLARY
Glucose-Capillary: 191 mg/dL — ABNORMAL HIGH (ref 70–99)
Glucose-Capillary: 93 mg/dL (ref 70–99)

## 2020-02-13 LAB — CBC
HCT: 43 % (ref 39.0–52.0)
Hemoglobin: 15.2 g/dL (ref 13.0–17.0)
MCH: 29.6 pg (ref 26.0–34.0)
MCHC: 35.3 g/dL (ref 30.0–36.0)
MCV: 83.7 fL (ref 80.0–100.0)
Platelets: 267 10*3/uL (ref 150–400)
RBC: 5.14 MIL/uL (ref 4.22–5.81)
RDW: 11.9 % (ref 11.5–15.5)
WBC: 6.6 10*3/uL (ref 4.0–10.5)
nRBC: 0 % (ref 0.0–0.2)

## 2020-02-13 LAB — LIPID PANEL
Cholesterol: 279 mg/dL — ABNORMAL HIGH (ref 0–200)
HDL: 25 mg/dL — ABNORMAL LOW (ref >40)
LDL Cholesterol: UNDETERMINED mg/dL (ref 0–99)
Total CHOL/HDL Ratio: 11.2 RATIO
Triglycerides: 870 mg/dL — ABNORMAL HIGH (ref <150)
VLDL: UNDETERMINED mg/dL (ref 0–40)

## 2020-02-13 LAB — ABO/RH: ABO/RH(D): B POS

## 2020-02-13 LAB — HIV ANTIBODY (ROUTINE TESTING W REFLEX): HIV Screen 4th Generation wRfx: NONREACTIVE

## 2020-02-13 LAB — URINALYSIS, ROUTINE W REFLEX MICROSCOPIC
Bacteria, UA: NONE SEEN
Bilirubin Urine: NEGATIVE
Glucose, UA: >=500 mg/dL — AB
Hgb urine dipstick: NEGATIVE
Ketones, ur: 20 mg/dL — AB
Leukocytes,Ua: NEGATIVE
Nitrite: NEGATIVE
Protein, ur: NEGATIVE mg/dL
Specific Gravity, Urine: 1.026 (ref 1.005–1.030)
pH: 5 (ref 5.0–8.0)

## 2020-02-13 LAB — TYPE AND SCREEN
ABO/RH(D): B POS
Antibody Screen: NEGATIVE

## 2020-02-13 LAB — HEPATIC FUNCTION PANEL
ALT: 25 U/L (ref 0–44)
AST: 20 U/L (ref 15–41)
Albumin: 3.8 g/dL (ref 3.5–5.0)
Alkaline Phosphatase: 102 U/L (ref 38–126)
Bilirubin, Direct: 0.1 mg/dL (ref 0.0–0.2)
Indirect Bilirubin: 0.9 mg/dL (ref 0.3–0.9)
Total Bilirubin: 1 mg/dL (ref 0.3–1.2)
Total Protein: 6.9 g/dL (ref 6.5–8.1)

## 2020-02-13 LAB — CBG MONITORING, ED
Glucose-Capillary: >600 mg/dL (ref 70–99)
Glucose-Capillary: 464 mg/dL — ABNORMAL HIGH (ref 70–99)
Glucose-Capillary: 237 mg/dL — ABNORMAL HIGH (ref 70–99)
Glucose-Capillary: 404 mg/dL — ABNORMAL HIGH (ref 70–99)
Glucose-Capillary: 190 mg/dL — ABNORMAL HIGH (ref 70–99)
Glucose-Capillary: 156 mg/dL — ABNORMAL HIGH (ref 70–99)

## 2020-02-13 LAB — LDL CHOLESTEROL, DIRECT: Direct LDL: 72.3 mg/dL (ref 0–99)

## 2020-02-13 LAB — PROTIME-INR
INR: 0.9 (ref 0.8–1.2)
Prothrombin Time: 12.1 seconds (ref 11.4–15.2)

## 2020-02-13 LAB — BETA-HYDROXYBUTYRIC ACID
Beta-Hydroxybutyric Acid: 3.46 mmol/L — ABNORMAL HIGH (ref 0.05–0.27)
Beta-Hydroxybutyric Acid: 0.43 mmol/L — ABNORMAL HIGH (ref 0.05–0.27)

## 2020-02-13 LAB — HEMOGLOBIN A1C
Hgb A1c MFr Bld: 14.1 % — ABNORMAL HIGH (ref 4.8–5.6)
Mean Plasma Glucose: 357.97 mg/dL

## 2020-02-13 LAB — SARS CORONAVIRUS 2 (TAT 6-24 HRS): SARS Coronavirus 2: NEGATIVE

## 2020-02-13 MED ORDER — NIFEDIPINE ER OSMOTIC RELEASE 30 MG PO TB24: 30.0000 mg | ORAL_TABLET | Freq: Every day | ORAL | Status: DC

## 2020-02-13 MED ORDER — LISINOPRIL-HYDROCHLOROTHIAZIDE 10-12.5 MG PO TABS: 1.0000 | ORAL_TABLET | Freq: Every day | ORAL | Status: DC

## 2020-02-13 MED ORDER — INSULIN GLARGINE 100 UNIT/ML ~~LOC~~ SOLN
15.0000 [IU] | Freq: Once | SUBCUTANEOUS | Status: AC
  Administered 2020-02-13: 15 [IU] via SUBCUTANEOUS
  Filled 2020-02-13: qty 0.15

## 2020-02-13 MED ORDER — SODIUM CHLORIDE 0.9 % IV SOLN: INTRAVENOUS | Status: DC

## 2020-02-13 MED ORDER — DEXTROSE-NACL 5-0.45 % IV SOLN: INTRAVENOUS | Status: DC

## 2020-02-13 MED ORDER — HYDROCHLOROTHIAZIDE 12.5 MG PO CAPS
12.5000 mg | ORAL_CAPSULE | Freq: Every day | ORAL | Status: DC
  Administered 2020-02-14 – 2020-02-15 (×2): 12.5 mg via ORAL
  Filled 2020-02-13 (×3): qty 1

## 2020-02-13 MED ORDER — PNEUMOCOCCAL VAC POLYVALENT 25 MCG/0.5ML IJ INJ
0.5000 mL | INJECTION | INTRAMUSCULAR | Status: AC
  Administered 2020-02-14: 0.5 mL via INTRAMUSCULAR
  Filled 2020-02-13: qty 0.5

## 2020-02-13 MED ORDER — LACTATED RINGERS IV BOLUS
1000.0000 mL | Freq: Once | INTRAVENOUS | Status: AC
  Administered 2020-02-13: 1000 mL via INTRAVENOUS

## 2020-02-13 MED ORDER — LISINOPRIL 10 MG PO TABS
10.0000 mg | ORAL_TABLET | Freq: Every day | ORAL | Status: DC
  Administered 2020-02-14 – 2020-02-15 (×2): 10 mg via ORAL
  Filled 2020-02-13 (×3): qty 1

## 2020-02-13 MED ORDER — INSULIN REGULAR(HUMAN) IN NACL 100-0.9 UT/100ML-% IV SOLN
INTRAVENOUS | Status: DC
  Administered 2020-02-13: 10.5 [IU]/h via INTRAVENOUS
  Filled 2020-02-13: qty 100

## 2020-02-13 MED ORDER — LACTATED RINGERS IV BOLUS
1000.0000 mL | Freq: Once | INTRAVENOUS | Status: AC
  Administered 2020-02-13: 14:00:00 1000 mL via INTRAVENOUS

## 2020-02-13 MED ORDER — HYDROCHLOROTHIAZIDE 25 MG PO TABS: 25.0000 mg | ORAL_TABLET | Freq: Every day | ORAL | Status: DC

## 2020-02-13 MED ORDER — DEXTROSE 50 % IV SOLN: 0.0000 mL | INTRAVENOUS | Status: DC | PRN

## 2020-02-13 MED ORDER — POTASSIUM CHLORIDE 10 MEQ/100ML IV SOLN
10.0000 meq | INTRAVENOUS | Status: AC
  Administered 2020-02-13 (×2): 10 meq via INTRAVENOUS
  Filled 2020-02-13 (×2): qty 100

## 2020-02-13 MED ORDER — PANTOPRAZOLE SODIUM 40 MG PO TBEC: 40.0000 mg | DELAYED_RELEASE_TABLET | Freq: Every day | ORAL | Status: DC

## 2020-02-13 MED ORDER — ROSUVASTATIN CALCIUM 20 MG PO TABS: 20.0000 mg | ORAL_TABLET | Freq: Every day | ORAL | Status: DC

## 2020-02-13 MED ORDER — FENOFIBRATE 54 MG PO TABS
54.0000 mg | ORAL_TABLET | Freq: Every day | ORAL | Status: DC
  Administered 2020-02-14 – 2020-02-15 (×2): 54 mg via ORAL
  Filled 2020-02-13 (×3): qty 1

## 2020-02-13 MED ORDER — LABETALOL HCL 5 MG/ML IV SOLN: 10.0000 mg | INTRAVENOUS | Status: DC | PRN

## 2020-02-13 NOTE — ED Notes (Signed)
PAGED ADMITTING 

## 2020-02-13 NOTE — ED Provider Notes (Signed)
[facility] EMERGENCY DEPARTMENT Provider Note   CSN: [id number] Arrival date & time: 02/13/20  1244     History Chief Complaint  Patient presents with  . Tachycardia  . Hyperglycemia    Theodore Wood is a 69 y.o. male medical history of angina, reflux, hypertension, hyperlipidemia and history of prostate cancer.  Follows at the [facility].  He states that for the past few months he has had excessive thirst, urination and unexplained weight loss he estimates it to be about 40 pounds.  Patient went to see his primary care doctor at the [facility] and had labs drawn.  He was called when he got home and told to come to the emergency department immediately because his blood sugar was very high.  He does not have a history of diabetes.  He denies abdominal pain, nausea, vomiting, fevers, chills, chest pain.  Patient states that he used to drink but only drinks 2 beers once a week on Sundays he used to smoke cigarettes and only smokes 2 cigarettes on Saturdays.  HPI     Past Medical History:  Diagnosis Date  . Angina   . GERD (gastroesophageal reflux disease)   . High cholesterol   . Hypertension   . Prostate cancer [facility])     Patient Active Problem List   Diagnosis Date Noted  . Malignant neoplasm of prostate ([place]) 12/12/2018  . Abnormal cardiac function test 01/14/2016  . Abnormal EKG   . Hyperlipidemia 01/11/2016  . Tobacco abuse 01/11/2016  . Epigastric pain 01/11/2016  . Gynecomastia, male 01/11/2016  . AKI (acute kidney injury) ([place]) 01/11/2016  . Atypical chest pain 01/11/2016  . Statin myopathy 02/04/2012  . Chest pain 02/03/2012  . Rhabdomyolysis 02/03/2012  . HTN (hypertension) 02/03/2012  . Alcohol abuse 02/03/2012  . Cocaine abuse ([place]) 02/03/2012    Past Surgical History:  Procedure Laterality Date  . CARDIAC CATHETERIZATION  1990's  . PROSTATE BIOPSY         Family History  Problem Relation Age of Onset  . Stroke Mother   .  Stroke Father   . Stroke Brother   . Cancer Maternal Aunt        unknown type    Social History   Tobacco Use  . Smoking status: Former Smoker    Packs/day: 0.25    Years: 30.00    Pack years: 7.50    Types: Cigarettes  . Smokeless tobacco: Never Used  Substance Use Topics  . Alcohol use: Yes    Alcohol/week: 42.0 standard drinks    Types: 42 Cans of beer per week    Comment: 4 X  a week "a couple of beers"  . Drug use: Yes    Types: Cocaine, Marijuana, Heroin    Comment: 02/03/12 "3 days ago I did cocaine to get the pain off; used heroin years ago" 08/28/2014 " I stopped using all that"    Home Medications Prior to Admission medications   Medication Sig Start Date End Date Taking? Authorizing Provider  acetaminophen (TYLENOL) 325 MG tablet Take 2 tablets (650 mg total) by mouth every 6 (six) hours as needed for moderate pain. Patient not taking: Reported on 12/30/2018 01/13/16   [name], MD  diclofenac sodium (VOLTAREN) 1 % GEL Apply 2 g topically 4 (four) times daily. Patient not taking: Reported on 12/30/2018 01/13/16   [name], MD  fish oil-omega-3 fatty acids 1000 MG capsule Take 1 g by mouth daily.  [provider]  folic acid (FOLVITE) 1 MG tablet Take 1 tablet (1 mg total) by mouth daily. Patient not taking: Reported on 12/30/2018 01/13/16   [name], MD  lisinopril-hydrochlorothiazide (PRINZIDE,ZESTORETIC) 10-12.5 MG per tablet Take 1 tablet by mouth daily.    [provider]  Multiple Vitamin (MULTIVITAMIN WITH MINERALS) TABS tablet Take 1 tablet by mouth daily. Patient not taking: Reported on 12/30/2018 01/13/16   [name], MD  nitroGLYCERIN (NITROSTAT) 0.3 MG SL tablet Place 1 tablet (0.3 mg total) under the tongue every 5 (five) minutes as needed for chest pain. Patient not taking: Reported on 12/30/2018 01/13/16   [name], MD  oxyCODONE-acetaminophen (PERCOCET/ROXICET) 5-325 MG tablet Take 1 tablet by mouth every 6 (six) hours as  needed for severe pain. Patient not taking: Reported on 12/30/2018 11/25/17   Hedges, Dellis Filbert, PA-C  oxymetazoline (AFRIN) 0.05 % nasal spray Place 1 spray into both nostrils as needed for congestion.    [provider]  pantoprazole (PROTONIX) 40 MG tablet Take 1 tablet (40 mg total) by mouth daily. Patient not taking: Reported on 12/30/2018 01/13/16   [name], MD  rosuvastatin (CRESTOR) 40 MG tablet Take 1 tablet (40 mg total) by mouth daily at 6 PM. Patient not taking: Reported on 12/30/2018 01/13/16   [name], MD    Allergies    Patient has no known allergies.  Review of Systems   Review of Systems Ten systems reviewed and are negative for acute change, except as noted in the HPI.   Physical Exam Updated Vital Signs BP (!) 192/106   Pulse (!) 127   Temp 99.1 F (37.3 C) (Oral)   Resp 20   Ht 5\' 3"  (1.6 m)   Wt 63.5 kg   SpO2 99%   BMI 24.80 kg/m   Physical Exam Vitals and nursing note reviewed.  Constitutional:      General: He is not in acute distress.    Appearance: He is well-developed. He is not diaphoretic.     Comments: Thin male NAD   HENT:     Head: Normocephalic and atraumatic.  Eyes:     General: No scleral icterus.    Conjunctiva/sclera: Conjunctivae normal.  Cardiovascular:     Rate and Rhythm: Normal rate and regular rhythm.     Heart sounds: Normal heart sounds.  Pulmonary:     Effort: Pulmonary effort is normal. No respiratory distress.     Breath sounds: Normal breath sounds.  Abdominal:     Palpations: Abdomen is soft.     Tenderness: There is no abdominal tenderness.  Musculoskeletal:     Cervical back: Normal range of motion and neck supple.  Skin:    General: Skin is warm and dry.  Neurological:     Mental Status: He is alert.  Psychiatric:        Behavior: Behavior normal.     ED Results / Procedures / Treatments   Labs (all labs ordered are listed, but only abnormal results are displayed) Labs Reviewed  CBG  MONITORING, ED - Abnormal; Notable for the following components:      Result Value   Glucose-Capillary >600 (*)    All other components within normal limits  BASIC METABOLIC PANEL  CBC  URINALYSIS, ROUTINE W REFLEX MICROSCOPIC  TYPE AND SCREEN    EKG EKG Interpretation  Date/Time:  Tuesday February 13 2020 12:45:27 EST Ventricular Rate:  127 PR Interval:  148 QRS Duration: 88 QT Interval:  288 QTC  Calculation: 418 R Axis:   49 Text Interpretation: Sinus tachycardia ST & T wave abnormality, consider inferolateral ischemia Abnormal ECG ST/T changes similar to 2019 but rate is faster Confirmed by Sherwood Gambler [phone]) on 02/13/2020 12:53:09 PM   Radiology No results found.  Procedures .Critical Care Performed by: Margarita Mail, PA-C Authorized by: Margarita Mail, PA-C   Critical care provider statement:    Critical care time (minutes):  30   Critical care time was exclusive of:  Separately billable procedures and treating other patients   Critical care was necessary to treat or prevent imminent or life-threatening deterioration of the following conditions:  Metabolic crisis   Critical care was time spent personally by me on the following activities:  Discussions with consultants, evaluation of patient's response to treatment, examination of patient, ordering and performing treatments and interventions, ordering and review of laboratory studies, ordering and review of radiographic studies, pulse oximetry, re-evaluation of patient's condition, obtaining history from patient or surrogate and review of old charts   (including critical care time)  Medications Ordered in ED Medications  lactated ringers bolus 1,000 mL (has no administration in time range)  lactated ringers bolus 1,000 mL (1,000 mLs Intravenous New Bag/Given 02/13/20 1317)    ED Course  I have reviewed the triage vital signs and the nursing notes.  Pertinent labs & imaging results that were available during my  care of the patient were reviewed by me and considered in my medical decision making (see chart for details).  Clinical Course as of Feb 13 1412  Tue Feb 13, 2020  1405 Anion gap(!): 19 [AH]    Clinical Course User Index [AH] Margarita Mail, PA-C   MDM Rules/Calculators/A&P                       69 year old male here with known hyperglycemia.  I have reviewed the patient's labs which show blood glucose of 650 with an associated hyponatremia of 128.  Anion gap of 19.  He does not have a urinalysis present however in the setting of anion gap acidosis and highly elevated blood glucose I suspect this is DKA.  CBC shows no elevated white blood cell count, no anemia.  Patient is not uremic on his metabolic panel.  EKG shows some ST segment depressions in the lateral inferior leads which are unchanged from previous tracing.    Final Clinical Impression(s) / ED Diagnoses Final diagnoses:  None    Rx / DC Orders ED Discharge Orders    None       Margarita Mail, PA-C 02/13/20 1418    Sherwood Gambler, MD 02/14/20 [phone]

## 2020-02-13 NOTE — ED Triage Notes (Signed)
Pt reports he got bloodwork at the [place], got a call stating his CBG was >600

## 2020-02-13 NOTE — ED Notes (Signed)
Date and time results received: 02/13/20   Test: glucose Critical Value: 650 mg/dl  Name of Provider Notified: Vernie Shanks MD

## 2020-02-13 NOTE — ED Triage Notes (Addendum)
Pt arrives POV for eval of bloody stool x 3 weeks, "hand cramping up and getting strokey" x 3 weeks and general malaise. Pt reports he went to [place] MD this AM and CBG was found to be >600, advised to present here. Pt denies pain, states he "just doesn't feel well". Reports only known hx of HTN

## 2020-02-13 NOTE — H&P (Signed)
Date: 02/13/2020               Patient Name:  Theodore Wood MRN: [id number]  DOB: [birth year] Age / Sex: 69 y.o., male   PCP: Patient, No Pcp Per         Medical Service: Internal Medicine Teaching Service         Attending Physician: Dr. Sherwood Gambler, MD    First Contact: Dr. Marianna Payment Pager: [phone]  Second Contact: Dr. Eileen Stanford Pager: [phone]       After Hours (After 5p/  First Contact Pager: [phone]  weekends / holidays): Second Contact Pager: [phone]   Chief Complaint: Polyuria, polydipsia, lower extremity cramping  History of Present Illness: Theodore Wood has a PMHx significant for HTN, hyperlipidemia, prostate cancer s/p radiation (last dose 02/2019) who presented to the ED at the behest of his PCP at that [facility] after he was found to have hyperglycemia. He reports that he was in his usual state of health until a few months ago when he began experiencing polyuria, polyphagia, polydipsia as well as about a 40 pound unexpected weight loss. Since onset of symptoms, he has also been experiencing worsening lowe extremity cramping. He denies fevers, shortness of breath or recent travel history but does endorse dizziness.  He does mention experiencing hematochezia recently, states that he has been having blood in his stool for roughly a month. He denies abdominal pain, rectal pain/masses NSAID use, excessive ETOH use, history of cirrhosis. Patient admits to having a colonoscopy a year ago without abnormality. Per chart review patient does have a history of ETOH and cocaine use disorder.   Patient presents with a new diagnosis of diabetes, recent weight loss excessive thirst  Social:  He is retired, and lives in [place] with is brothers. He smokes cigarettes with a roughly 15 pack year history. He drinks ETOH socially on saturdays (1-2 beers)  Family History:  Family History  Problem Relation Age of Onset  . Stroke Mother   . Stroke Father   . Stroke Brother   . Cancer Maternal Aunt         unknown type     Meds:  Current Outpatient Medications  Medication Instructions  . acetaminophen (TYLENOL) 650 mg, Oral, Every 6 hours PRN  . cetirizine (ZYRTEC) 10 mg, Oral, Daily  . diclofenac sodium (VOLTAREN) 2 g, Topical, 4 times daily  . folic acid (FOLVITE) 1 mg, Oral, Daily  . gabapentin (NEURONTIN) 300 mg, Oral, 2 times daily  . hydrochlorothiazide (HYDRODIURIL) 25 mg, Oral, Daily  . Multiple Vitamin (MULTIVITAMIN WITH MINERALS) TABS tablet 1 tablet, Oral, Daily  . NIFEdipine (ADALAT CC) 30 mg, Oral, Daily  . nitroGLYCERIN (NITROSTAT) 0.3 mg, Sublingual, Every 5 min PRN  . oxyCODONE-acetaminophen (PERCOCET/ROXICET) 5-325 MG tablet 1 tablet, Oral, Every 6 hours PRN  . pantoprazole (PROTONIX) 40 mg, Oral, Daily  . rosuvastatin (CRESTOR) 40 mg, Oral, Daily-1800  . sildenafil (VIAGRA) 100 mg, Oral, Daily PRN     Allergies: Allergies as of 02/13/2020  . (No Known Allergies)   Past Medical History:  Diagnosis Date  . Angina   . GERD (gastroesophageal reflux disease)   . High cholesterol   . Hypertension   . Prostate cancer ([place])      Review of Systems: A complete ROS was negative except as per HPI.   Physical Exam: Blood pressure (!) 147/91, pulse 82, temperature 99.1 F (37.3 C), temperature source Oral, resp. rate 16, height 5\' 3"  (1.6  m), weight 63.5 kg, SpO2 99 %. Physical Exam  Constitutional: He is well-developed, well-nourished, and in no distress.  HENT:  Head: Normocephalic and atraumatic.  Eyes: EOM are normal.  Cardiovascular: Normal rate, regular rhythm and intact distal pulses. Exam reveals no gallop and no friction rub.  No murmur heard. Musculoskeletal:     Cervical back: Normal range of motion.   On presentation the patient had a blood glucose of 650, and anion gap of 19 without acidosis, hyponatremia of 128 corrected to 137 elevated beta hydroxybutyrate of 3.46.  UA did show signs of ketones with significant glucosuria.  FOBT was positive for  blood.  CBC    Component Value Date/Time   WBC 6.6 02/13/2020 1304   RBC 5.14 02/13/2020 1304   HGB 15.2 02/13/2020 1304   HCT 43.0 02/13/2020 1304   PLT 267 02/13/2020 1304   MCV 83.7 02/13/2020 1304   MCH 29.6 02/13/2020 1304   MCHC 35.3 02/13/2020 1304   RDW 11.9 02/13/2020 1304   LYMPHSABS 2.5 02/03/2012 0745   MONOABS 1.4 (H) 02/03/2012 0745   EOSABS 0.1 02/03/2012 0745   BASOSABS 0.0 02/03/2012 0745   CMP     Component Value Date/Time   NA 133 (L) 02/13/2020 1547   K 3.4 (L) 02/13/2020 1547   CL 94 (L) 02/13/2020 1547   CO2 24 02/13/2020 1547   GLUCOSE 252 (H) 02/13/2020 1547   BUN 17 02/13/2020 1547   CREATININE 1.00 02/13/2020 1547   CALCIUM 9.7 02/13/2020 1547   PROT 6.5 01/12/2016 0533   ALBUMIN 3.3 (L) 01/12/2016 0533   AST 71 (H) 01/12/2016 0533   ALT 65 (H) 01/12/2016 0533   ALKPHOS 82 01/12/2016 0533   BILITOT 0.6 01/12/2016 0533   GFRNONAA >60 02/13/2020 1547   GFRAA >60 02/13/2020 1547  Corrected Sodium: 135 mmol/L  Lab Results  Component Value Date   GLUCOSEU >=500 (A) 02/13/2020   KETONESUR 20 (A) 02/13/2020    EKG: personally reviewed my interpretation is sinus tachycardia  CXR: personally reviewed my interpretation is aortic atherosclerosis without acute abnormalities.  Assessment & Plan by Problem: Principal Problem:   Diabetic ketosis ([place])  #Severe Symptomatic Hyperglycemia: Patient presented with elevated glucose levels of 650 and symptoms of hypoglycemia.  He received close to 3L LR bolus in the ED with insulin drip started.  - Hgb A1C pending  - NS at 75 cc/hr and Dextrose at 75 cc/hr with CBG < 250  Mg/dL - Regular insulin drip - Admitted to Telemetry  - Replete potassium as needed - Q4 hour BMPs  #GI Bleed Patient had a positive FOBT with history of blood in stools for the past month.  Patient does have a history of EtOH use disorder which increases his risk of upper GI bleed.  Patient's CBC does not show any signs of anemia  and he had a recent colonoscopy a year ago that he states was WNL.  Patient also has a history of prostate cancer with radiation therapy roughly a year ago but does not have any pain with defecation concerning for proctitis - SCDs for DVT prophylaxis.  - Patient will likely need out patient follow up with GI  #HLD - Repeat lipid panel  - Med list has rosuvastatin, but patient denies taking this medication due to side effects. He has a history of statin myopathy with subsequent decrease in his dose to 40 mg, but patient denies taking the medication.   #HTN: We will continue patient's home antihypertensive  medications including lisinopril 10 mg daily, hydrochlorothiazide 12.5 mg daily.  We will add labetalol 10 mg as needed every 4 hours for blood pressures greater than 123XX123 systolic in the 123XX123 diastolic.  #Hx. of prostate cancer Patient states that he has not seen the Urologist in almost a year. He is suppose to have a follow up appointment early this month.  Diet: N.p.o. VTE: SCDs IVF: Normal saline Code: Full code  Dispo: Admit patient to Observation with expected length of stay less than 2 midnights.  Signed: Jean Rosenthal, MD 02/13/2020, 3:56 PM  Pager: [phone]

## 2020-02-14 DIAGNOSIS — E1110 Type 2 diabetes mellitus with ketoacidosis without coma: Secondary | ICD-10-CM | POA: Diagnosis not present

## 2020-02-14 DIAGNOSIS — D638 Anemia in other chronic diseases classified elsewhere: Secondary | ICD-10-CM

## 2020-02-14 DIAGNOSIS — D649 Anemia, unspecified: Secondary | ICD-10-CM

## 2020-02-14 LAB — BASIC METABOLIC PANEL
Anion gap: 10 (ref 5–15)
Anion gap: 10 (ref 5–15)
Anion gap: 9 (ref 5–15)
Anion gap: 9 (ref 5–15)
Anion gap: 9 (ref 5–15)
BUN: 10 mg/dL (ref 8–23)
BUN: 11 mg/dL (ref 8–23)
BUN: 11 mg/dL (ref 8–23)
BUN: 11 mg/dL (ref 8–23)
BUN: 8 mg/dL (ref 8–23)
CO2: 22 mmol/L (ref 22–32)
CO2: 23 mmol/L (ref 22–32)
CO2: 23 mmol/L (ref 22–32)
CO2: 24 mmol/L (ref 22–32)
CO2: 26 mmol/L (ref 22–32)
Calcium: 8.3 mg/dL — ABNORMAL LOW (ref 8.9–10.3)
Calcium: 8.4 mg/dL — ABNORMAL LOW (ref 8.9–10.3)
Calcium: 8.4 mg/dL — ABNORMAL LOW (ref 8.9–10.3)
Calcium: 8.5 mg/dL — ABNORMAL LOW (ref 8.9–10.3)
Calcium: 8.8 mg/dL — ABNORMAL LOW (ref 8.9–10.3)
Chloride: 102 mmol/L (ref 98–111)
Chloride: 103 mmol/L (ref 98–111)
Chloride: 95 mmol/L — ABNORMAL LOW (ref 98–111)
Chloride: 98 mmol/L (ref 98–111)
Chloride: 98 mmol/L (ref 98–111)
Creatinine, Ser: 0.64 mg/dL (ref 0.61–1.24)
Creatinine, Ser: 0.78 mg/dL (ref 0.61–1.24)
Creatinine, Ser: 0.85 mg/dL (ref 0.61–1.24)
Creatinine, Ser: 0.95 mg/dL (ref 0.61–1.24)
Creatinine, Ser: 1.05 mg/dL (ref 0.61–1.24)
GFR calc Af Amer: >60 mL/min (ref >60)
GFR calc Af Amer: >60 mL/min (ref >60)
GFR calc Af Amer: >60 mL/min (ref >60)
GFR calc Af Amer: >60 mL/min (ref >60)
GFR calc Af Amer: >60 mL/min (ref >60)
GFR calc non Af Amer: >60 mL/min (ref >60)
GFR calc non Af Amer: >60 mL/min (ref >60)
GFR calc non Af Amer: >60 mL/min (ref >60)
GFR calc non Af Amer: >60 mL/min (ref >60)
GFR calc non Af Amer: >60 mL/min (ref >60)
Glucose, Bld: 102 mg/dL — ABNORMAL HIGH (ref 70–99)
Glucose, Bld: 262 mg/dL — ABNORMAL HIGH (ref 70–99)
Glucose, Bld: 339 mg/dL — ABNORMAL HIGH (ref 70–99)
Glucose, Bld: 372 mg/dL — ABNORMAL HIGH (ref 70–99)
Glucose, Bld: 415 mg/dL — ABNORMAL HIGH (ref 70–99)
Potassium: 2.6 mmol/L — CL (ref 3.5–5.1)
Potassium: 3.4 mmol/L — ABNORMAL LOW (ref 3.5–5.1)
Potassium: 3.5 mmol/L (ref 3.5–5.1)
Potassium: 3.7 mmol/L (ref 3.5–5.1)
Potassium: 3.9 mmol/L (ref 3.5–5.1)
Sodium: 128 mmol/L — ABNORMAL LOW (ref 135–145)
Sodium: 130 mmol/L — ABNORMAL LOW (ref 135–145)
Sodium: 134 mmol/L — ABNORMAL LOW (ref 135–145)
Sodium: 134 mmol/L — ABNORMAL LOW (ref 135–145)
Sodium: 135 mmol/L (ref 135–145)

## 2020-02-14 LAB — BETA-HYDROXYBUTYRIC ACID: Beta-Hydroxybutyric Acid: 0.73 mmol/L — ABNORMAL HIGH (ref 0.05–0.27)

## 2020-02-14 LAB — FOLATE: Folate: 19 ng/mL (ref >5.9)

## 2020-02-14 LAB — IRON AND TIBC
Iron: 53 ug/dL (ref 45–182)
Saturation Ratios: 24 % (ref 17.9–39.5)
TIBC: 224 ug/dL — ABNORMAL LOW (ref 250–450)
UIBC: 171 ug/dL

## 2020-02-14 LAB — GLUCOSE, CAPILLARY
Glucose-Capillary: 176 mg/dL — ABNORMAL HIGH (ref 70–99)
Glucose-Capillary: 197 mg/dL — ABNORMAL HIGH (ref 70–99)
Glucose-Capillary: 288 mg/dL — ABNORMAL HIGH (ref 70–99)
Glucose-Capillary: 314 mg/dL — ABNORMAL HIGH (ref 70–99)
Glucose-Capillary: 331 mg/dL — ABNORMAL HIGH (ref 70–99)
Glucose-Capillary: 355 mg/dL — ABNORMAL HIGH (ref 70–99)
Glucose-Capillary: 90 mg/dL (ref 70–99)

## 2020-02-14 LAB — TRIGLYCERIDES: Triglycerides: 446 mg/dL — ABNORMAL HIGH (ref <150)

## 2020-02-14 LAB — CBC
HCT: 34.1 % — ABNORMAL LOW (ref 39.0–52.0)
Hemoglobin: 12.2 g/dL — ABNORMAL LOW (ref 13.0–17.0)
MCH: 29.5 pg (ref 26.0–34.0)
MCHC: 35.8 g/dL (ref 30.0–36.0)
MCV: 82.4 fL (ref 80.0–100.0)
Platelets: 206 10*3/uL (ref 150–400)
RBC: 4.14 MIL/uL — ABNORMAL LOW (ref 4.22–5.81)
RDW: 11.9 % (ref 11.5–15.5)
WBC: 3.8 10*3/uL — ABNORMAL LOW (ref 4.0–10.5)
nRBC: 0 % (ref 0.0–0.2)

## 2020-02-14 LAB — RETICULOCYTES
Immature Retic Fract: 9.6 % (ref 2.3–15.9)
RBC.: 4.37 MIL/uL (ref 4.22–5.81)
Retic Count, Absolute: 55.1 10*3/uL (ref 19.0–186.0)
Retic Ct Pct: 1.3 % (ref 0.4–3.1)

## 2020-02-14 LAB — VITAMIN B12: Vitamin B-12: 647 pg/mL (ref 180–914)

## 2020-02-14 LAB — FERRITIN: Ferritin: 292 ng/mL (ref 24–336)

## 2020-02-14 MED ORDER — INSULIN GLARGINE 100 UNIT/ML ~~LOC~~ SOLN
20.0000 [IU] | Freq: Every day | SUBCUTANEOUS | Status: DC
  Filled 2020-02-14: qty 0.2

## 2020-02-14 MED ORDER — INSULIN GLARGINE 100 UNIT/ML ~~LOC~~ SOLN
10.0000 [IU] | Freq: Every day | SUBCUTANEOUS | Status: DC
  Filled 2020-02-14: qty 0.1

## 2020-02-14 MED ORDER — BLOOD GLUCOSE MONITOR KIT: PACK | 0 refills | Status: AC

## 2020-02-14 MED ORDER — INSULIN GLARGINE 100 UNIT/ML ~~LOC~~ SOLN
7.0000 [IU] | Freq: Every day | SUBCUTANEOUS | Status: DC
  Filled 2020-02-14: qty 0.07

## 2020-02-14 MED ORDER — METFORMIN HCL 500 MG PO TABS: 500.0000 mg | ORAL_TABLET | Freq: Every day | ORAL | 11 refills | Status: DC

## 2020-02-14 MED ORDER — INSULIN GLARGINE 100 UNIT/ML ~~LOC~~ SOLN
20.0000 [IU] | Freq: Once | SUBCUTANEOUS | Status: DC
  Filled 2020-02-14: qty 0.2

## 2020-02-14 MED ORDER — INSULIN DETEMIR 100 UNIT/ML FLEXPEN: 18.0000 [IU] | PEN_INJECTOR | Freq: Every day | SUBCUTANEOUS | 11 refills | Status: DC

## 2020-02-14 MED ORDER — LIVING WELL WITH DIABETES BOOK
Freq: Once | Status: AC
  Filled 2020-02-14: qty 1

## 2020-02-14 MED ORDER — INSULIN STARTER KIT- PEN NEEDLES (ENGLISH)
1.0000 | Freq: Once | Status: AC
  Administered 2020-02-14: 1
  Filled 2020-02-14: qty 1

## 2020-02-14 MED ORDER — LACTATED RINGERS IV SOLN: INTRAVENOUS | Status: DC

## 2020-02-14 MED ORDER — INSULIN GLARGINE 100 UNIT/ML ~~LOC~~ SOLN
18.0000 [IU] | Freq: Every day | SUBCUTANEOUS | Status: DC
  Filled 2020-02-14: qty 0.18

## 2020-02-14 MED ORDER — INSULIN ASPART 100 UNIT/ML ~~LOC~~ SOLN
0.0000 [IU] | Freq: Three times a day (TID) | SUBCUTANEOUS | Status: DC
  Administered 2020-02-15: 11 [IU] via SUBCUTANEOUS
  Administered 2020-02-15: 15 [IU] via SUBCUTANEOUS

## 2020-02-14 MED ORDER — INSULIN GLARGINE 100 UNIT/ML ~~LOC~~ SOLN
18.0000 [IU] | Freq: Once | SUBCUTANEOUS | Status: AC
  Administered 2020-02-14: 18 [IU] via SUBCUTANEOUS
  Filled 2020-02-14: qty 0.18

## 2020-02-14 MED ORDER — LACTATED RINGERS IV SOLN: INTRAVENOUS | Status: AC

## 2020-02-14 MED ORDER — POTASSIUM CHLORIDE 10 MEQ/100ML IV SOLN
10.0000 meq | INTRAVENOUS | Status: AC
  Administered 2020-02-14 (×3): 10 meq via INTRAVENOUS
  Filled 2020-02-14 (×4): qty 100

## 2020-02-14 MED ORDER — INSULIN ASPART 100 UNIT/ML ~~LOC~~ SOLN: 0.0000 [IU] | Freq: Three times a day (TID) | SUBCUTANEOUS | Status: DC

## 2020-02-14 MED ORDER — INSULIN GLARGINE 100 UNIT/ML ~~LOC~~ SOLN
12.0000 [IU] | Freq: Two times a day (BID) | SUBCUTANEOUS | Status: DC
  Filled 2020-02-14: qty 0.12

## 2020-02-14 MED ORDER — LACTATED RINGERS IV BOLUS
1000.0000 mL | Freq: Once | INTRAVENOUS | Status: AC
  Administered 2020-02-14: 1000 mL via INTRAVENOUS

## 2020-02-14 MED ORDER — INSULIN ASPART 100 UNIT/ML ~~LOC~~ SOLN: 0.0000 [IU] | Freq: Every day | SUBCUTANEOUS | Status: DC

## 2020-02-14 MED ORDER — POTASSIUM CHLORIDE 10 MEQ/100ML IV SOLN
10.0000 meq | INTRAVENOUS | Status: AC
  Administered 2020-02-14 (×4): 10 meq via INTRAVENOUS
  Filled 2020-02-14 (×3): qty 100

## 2020-02-14 MED ORDER — INSULIN GLARGINE 100 UNIT/ML SOLOSTAR PEN: 18.0000 [IU] | PEN_INJECTOR | Freq: Every day | SUBCUTANEOUS | 11 refills | Status: DC

## 2020-02-14 MED ORDER — INSULIN ASPART 100 UNIT/ML ~~LOC~~ SOLN
5.0000 [IU] | Freq: Three times a day (TID) | SUBCUTANEOUS | Status: DC
  Administered 2020-02-14 – 2020-02-15 (×2): 5 [IU] via SUBCUTANEOUS

## 2020-02-14 MED ORDER — INSULIN ASPART 100 UNIT/ML ~~LOC~~ SOLN
0.0000 [IU] | Freq: Three times a day (TID) | SUBCUTANEOUS | Status: DC
  Administered 2020-02-14: 15 [IU] via SUBCUTANEOUS
  Administered 2020-02-14: 11 [IU] via SUBCUTANEOUS

## 2020-02-14 MED ORDER — INSULIN ASPART 100 UNIT/ML ~~LOC~~ SOLN
0.0000 [IU] | Freq: Three times a day (TID) | SUBCUTANEOUS | Status: DC
  Administered 2020-02-14: 11 [IU] via SUBCUTANEOUS

## 2020-02-14 MED FILL — PENTIPS 31G X 8 MM MISC: 31G X 8 MM | 30 days supply | Qty: 100 | Fill #0

## 2020-02-14 MED FILL — metFORMIN HCL 500 MG TABS: 500 | 30 days supply | Qty: 30 | Fill #0

## 2020-02-14 NOTE — Progress Notes (Signed)
Inpatient Diabetes Program Recommendations  AACE/ADA: New Consensus Statement on Inpatient Glycemic Control (2015)  Target Ranges:  Prepandial:   less than 140 mg/dL      Peak postprandial:   less than 180 mg/dL (1-2 hours)      Critically ill patients:  140 - 180 mg/dL   Lab Results  Component Value Date   GLUCAP 288 (H) 02/14/2020   HGBA1C 14.1 (H) 02/13/2020      Diabetes history: None  Outpatient Diabetes medications: None Current orders for Inpatient glycemic control: Lantus 20 units QD + Novolog 0-20 TID + 0-5 QHS + Novolog 5 units TID with meals  Note:  Inpatient diabetes consult placed for New DM2.  Spoke with pt about new diagnosis. Discussed A1C results with him and explained what an A1C is, basic pathophysiology of DM Type 2, basic home care, basic diabetes diet nutrition principles, importance of checking CBGs and maintaining good CBG control to prevent long-term and short-term complications. Reviewed signs and symptoms of hyperglycemia and hypoglycemia and how to treat hypoglycemia at home. Also reviewed blood sugar goals at home.  RNs to provide ongoing basic DM education at bedside with this patient. Have ordered educational booklet, insulin starter kit, and DM videos. Have also placed RD consult for DM diet education for this patient.  Asked staff to allow patient to check his own blood sugars with staff and administer insulins.    Introduced insulin pen and patient is very overwhelmed. Will return tomorrow to review insulin pen administration at length and continue to follow while inpatient.  Patient would like out patient DM education.  Ordered that and attached education to AVS.  His PCP is through the [place].  His medications can be mailed to him.  Will follow up tomorrow.   Thank you, Reche Dixon, RN, BSN Diabetes Coordinator Inpatient Diabetes Program [phone] (team pager from 8a-5p)

## 2020-02-14 NOTE — TOC Initial Note (Addendum)
Transition of Care [facility]) - Initial/Assessment Note    Patient Details  Name: Theodore Wood MRN: [id number] Date of Birth: 23-Aug-1951  Transition of Care [place] [facility]) CM/SW Contact:    Carles Collet, RN Phone Number: 02/14/2020, 12:42 PM  Clinical Narrative:            Patient from home w brother.  New DM, possible insulin on DC. Goes to [place],       PCP is Dr Marjo Bicker [phone] Joylene Igo [phone] SW is Abundio Miu [phone]  I left a VM notifying [place] of his admission.  Patient gets his meds filled at the [place]. Pharmacy [phone], they are open M-F. Many who will look at coverage and speak with team to see if we can get meds filled here before 5pm Friday.     Expected Discharge Plan: Home/Self Care Barriers to Discharge: Continued Medical Work up   Patient Goals and CMS Choice Patient states their goals for this hospitalization and ongoing recovery are:: to return home      Expected Discharge Plan and Services Expected Discharge Plan: Home/Self Care                                              Prior Living Arrangements/Services                       Activities of Daily Living Home Assistive Devices/Equipment: Eyeglasses ADL Screening (condition at time of admission) Patient's cognitive ability adequate to safely complete daily activities?: Yes Is the patient deaf or have difficulty hearing?: No Does the patient have difficulty seeing, even when wearing glasses/contacts?: Yes Does the patient have difficulty concentrating, remembering, or making decisions?: No Patient able to express need for assistance with ADLs?: Yes Does the patient have difficulty dressing or bathing?: No Independently performs ADLs?: Yes (appropriate for developmental age) Does the patient have difficulty walking or climbing stairs?: Yes Weakness of Legs: None Weakness of Arms/Hands: None  Permission Sought/Granted                   Emotional Assessment              Admission diagnosis:  Diabetic ketosis ([place]) [E13.10] Diabetic ketoacidosis without coma associated with type 2 diabetes mellitus ([place]) [E11.10] Patient Active Problem List   Diagnosis Date Noted  . Severe hyperglycemia due to diabetes mellitus ([place]) 02/13/2020  . Hypertriglyceridemia 02/13/2020  . Malignant neoplasm of prostate ([place]) 12/12/2018  . Abnormal cardiac function test 01/14/2016  . Abnormal EKG   . Hyperlipidemia 01/11/2016  . Tobacco abuse 01/11/2016  . Epigastric pain 01/11/2016  . Gynecomastia, male 01/11/2016  . AKI (acute kidney injury) ([place]) 01/11/2016  . Atypical chest pain 01/11/2016  . Statin myopathy 02/04/2012  . Chest pain 02/03/2012  . Rhabdomyolysis 02/03/2012  . HTN (hypertension) 02/03/2012  . Alcohol abuse 02/03/2012  . Cocaine abuse ([place]) 02/03/2012   PCP:  Patient, No Pcp Per Pharmacy:   [id number] [address], [place] [address] Phone: [phone] Fax: [phone]     Social Determinants of Health (SDOH) Interventions    Readmission Risk Interventions No flowsheet data found.

## 2020-02-14 NOTE — Progress Notes (Signed)
Subjective: HD#1 Events Overnight: Patient transition to subcu insulin overnight and given a diet this morning.  Patient was seen this morning on rounds. He reports that he feels better today than he was prior to admission. He denies any family history of diabetes. We did give him our recommendations regarding his new diagnosis of diabetes. He has a PCP at the [facility] in [place]. All his questions and complains were apropriately addressed.   Objective:  Vital signs in last 24 hours: Vitals:   02/13/20 1841 02/13/20 2147 02/14/20 0008 02/14/20 0415  BP: (!) 144/92 (!) 145/85 135/80 (!) 141/96  Pulse: 92 88 79 88  Resp: 18 20 18 17   Temp:  98.5 F (36.9 C) 97.8 F (36.6 C) 98.4 F (36.9 C)  TempSrc:  Oral  Oral  SpO2: 100% 98% 99% 100%  Weight:      Height:        Physical Exam: Physical Exam  Constitutional: He is well-developed, well-nourished, and in no distress.  HENT:  Head: Normocephalic and atraumatic.  Eyes: EOM are normal.  Cardiovascular: Normal rate.  Musculoskeletal:     Cervical back: Normal range of motion.    Filed Weights   02/13/20 1251  Weight: 63.5 kg     Intake/Output Summary (Last 24 hours) at 02/14/2020 0612 Last data filed at 02/14/2020 0528 Gross per 24 hour  Intake 1315.03 ml  Output 600 ml  Net 715.03 ml    Pertinent labs/Imaging: CBC Latest Ref Rng & Units 02/14/2020 02/13/2020 09/02/2018  WBC 4.0 - 10.5 K/uL 3.8(L) 6.6 10.0  Hemoglobin 13.0 - 17.0 g/dL 12.2(L) 15.2 15.6  Hematocrit 39.0 - 52.0 % 34.1(L) 43.0 45.5  Platelets 150 - 400 K/uL 206 267 198    CMP Latest Ref Rng & Units 02/14/2020 02/13/2020 02/13/2020  Glucose 70 - 99 mg/dL 102(H) 102(H) 161(H)  BUN 8 - 23 mg/dL 8 10 13   Creatinine 0.61 - 1.24 mg/dL 0.64 0.68 0.84  Sodium 135 - 145 mmol/L 134(L) 135 133(L)  Potassium 3.5 - 5.1 mmol/L 2.6(LL) 2.9(L) 3.6  Chloride 98 - 111 mmol/L 98 100 94(L)  CO2 22 - 32 mmol/L 26 26 26   Calcium 8.9 - 10.3 mg/dL 8.8(L) 9.1 9.5  Total Protein 6.5  - 8.1 g/dL - - 6.9  Total Bilirubin 0.3 - 1.2 mg/dL - - 1.0  Alkaline Phos 38 - 126 U/L - - 102  AST 15 - 41 U/L - - 20  ALT 0 - 44 U/L - - 25   Lipid Panel     Component Value Date/Time   CHOL 279 (H) 02/13/2020 1547   TRIG 446 (H) 02/14/2020 0047   HDL 25 (L) 02/13/2020 1547   CHOLHDL 11.2 02/13/2020 1547   VLDL UNABLE TO CALCULATE IF TRIGLYCERIDE OVER 400 mg/dL 02/13/2020 1547   LDLCALC UNABLE TO CALCULATE IF TRIGLYCERIDE OVER 400 mg/dL 02/13/2020 1547   LDLDIRECT 72.3 02/13/2020 1547   FOBT: positive  Hgb A1c: Lab Results  Component Value Date   HGBA1C 14.1 (H) 02/13/2020   Hepatic Function Panel: Lab Results  Component Value Date   PROT 6.9 02/13/2020   ALBUMIN 3.8 02/13/2020   AST 20 02/13/2020   ALT 25 02/13/2020   ALKPHOS 102 02/13/2020   BILITOT 1.0 02/13/2020   BILIDIR 0.1 02/13/2020   IBILI 0.9 02/13/2020    No results found.  Assessment/Plan:  Principal Problem:   Severe hyperglycemia due to diabetes mellitus ([place]) Active Problems:   HTN (hypertension)   Tobacco abuse  Hypertriglyceridemia    Patient Summary: Theodore Wood is a 69 y.o. with pertinent PMH of  hypertension, hyperlipidemia, prostate cancer status post radiation who presented with elevated blood glucose with signs of hyperglycemia and admitted for severe symptomatic hypoglycemia on hospital day 1  #DKA: Patient transition to subcutaneous insulin overnight and tolerating p.o. intake. - Sliding scale moderate with meals - Lantus 15 units q HS - Continue fluids - Diabetic educator consulted  - He will need medications sent to [facility]   #GI bleed: #Normocytic anemia, chronic disease Patient's admits to a history of hematochezia. FOBT positive. Patient has an anemia of 12.2.  - Anemia panel shows normal ferritin and iron with low TIBC likely anemia of chronic disease in the setting of his new diagnosis of DM.  #HLD: Patient has significantly elevated triglycerides that  improved minimally with insulin therapy. LDL is 73. He has not been taking his statin medication.  Patient will need follow-up with his primary care doctor to address his elevated triglycerides.  #HTN Patient is hypertensive today. Continue home antihypertensive medications.   Diet: Carb modified IVF: Lactated Ringer's VTE: Enoxaparin Code: Full PT/OT recs: Pending TOC recs: Pending   Dispo: Anticipated discharge tomorrow.    Marianna Payment, D.O. [facility], PGY-1 Date 02/14/2020 Time 6:12 AM

## 2020-02-15 DIAGNOSIS — E119 Type 2 diabetes mellitus without complications: Secondary | ICD-10-CM

## 2020-02-15 DIAGNOSIS — E1110 Type 2 diabetes mellitus with ketoacidosis without coma: Secondary | ICD-10-CM

## 2020-02-15 LAB — GLUCOSE, CAPILLARY
Glucose-Capillary: 174 mg/dL — ABNORMAL HIGH (ref 70–99)
Glucose-Capillary: 244 mg/dL — ABNORMAL HIGH (ref 70–99)
Glucose-Capillary: 258 mg/dL — ABNORMAL HIGH (ref 70–99)
Glucose-Capillary: 272 mg/dL — ABNORMAL HIGH (ref 70–99)
Glucose-Capillary: 308 mg/dL — ABNORMAL HIGH (ref 70–99)
Glucose-Capillary: 333 mg/dL — ABNORMAL HIGH (ref 70–99)
Glucose-Capillary: 76 mg/dL (ref 70–99)

## 2020-02-15 LAB — CBC
HCT: 33.9 % — ABNORMAL LOW (ref 39.0–52.0)
Hemoglobin: 12 g/dL — ABNORMAL LOW (ref 13.0–17.0)
MCH: 29.7 pg (ref 26.0–34.0)
MCHC: 35.4 g/dL (ref 30.0–36.0)
MCV: 83.9 fL (ref 80.0–100.0)
Platelets: 202 10*3/uL (ref 150–400)
RBC: 4.04 MIL/uL — ABNORMAL LOW (ref 4.22–5.81)
RDW: 12.3 % (ref 11.5–15.5)
WBC: 3.6 10*3/uL — ABNORMAL LOW (ref 4.0–10.5)
nRBC: 0 % (ref 0.0–0.2)

## 2020-02-15 LAB — BASIC METABOLIC PANEL
Anion gap: 10 (ref 5–15)
Anion gap: 8 (ref 5–15)
BUN: 9 mg/dL (ref 8–23)
BUN: 8 mg/dL (ref 8–23)
CO2: 22 mmol/L (ref 22–32)
CO2: 24 mmol/L (ref 22–32)
Calcium: 8.6 mg/dL — ABNORMAL LOW (ref 8.9–10.3)
Calcium: 8.6 mg/dL — ABNORMAL LOW (ref 8.9–10.3)
Chloride: 104 mmol/L (ref 98–111)
Chloride: 108 mmol/L (ref 98–111)
Creatinine, Ser: 0.79 mg/dL (ref 0.61–1.24)
Creatinine, Ser: 0.73 mg/dL (ref 0.61–1.24)
GFR calc Af Amer: >60 mL/min (ref >60)
GFR calc Af Amer: >60 mL/min (ref >60)
GFR calc non Af Amer: >60 mL/min (ref >60)
GFR calc non Af Amer: >60 mL/min (ref >60)
Glucose, Bld: 298 mg/dL — ABNORMAL HIGH (ref 70–99)
Glucose, Bld: 93 mg/dL (ref 70–99)
Potassium: 3.4 mmol/L — ABNORMAL LOW (ref 3.5–5.1)
Potassium: 3.2 mmol/L — ABNORMAL LOW (ref 3.5–5.1)
Sodium: 136 mmol/L (ref 135–145)
Sodium: 140 mmol/L (ref 135–145)

## 2020-02-15 LAB — TRIGLYCERIDES: Triglycerides: 336 mg/dL — ABNORMAL HIGH (ref <150)

## 2020-02-15 MED ORDER — POTASSIUM CHLORIDE CRYS ER 20 MEQ PO TBCR
40.0000 meq | EXTENDED_RELEASE_TABLET | Freq: Once | ORAL | Status: AC
  Administered 2020-02-15: 40 meq via ORAL
  Filled 2020-02-15: qty 2

## 2020-02-15 MED ORDER — BLOOD GLUCOSE MONITOR KIT: PACK | 0 refills | Status: DC

## 2020-02-15 MED ORDER — FENOFIBRATE 54 MG PO TABS: 54.0000 mg | ORAL_TABLET | Freq: Every day | ORAL | 0 refills | Status: DC

## 2020-02-15 MED ORDER — INSULIN GLARGINE 100 UNIT/ML ~~LOC~~ SOLN
18.0000 [IU] | Freq: Two times a day (BID) | SUBCUTANEOUS | Status: DC
  Filled 2020-02-15 (×2): qty 0.18

## 2020-02-15 MED ORDER — LISINOPRIL 10 MG PO TABS: 10.0000 mg | ORAL_TABLET | Freq: Every day | ORAL | 0 refills | Status: AC

## 2020-02-15 MED ORDER — INSULIN ASPART 100 UNIT/ML ~~LOC~~ SOLN: 12.0000 [IU] | Freq: Three times a day (TID) | SUBCUTANEOUS | 0 refills | Status: AC

## 2020-02-15 MED ORDER — INSULIN GLARGINE 100 UNIT/ML ~~LOC~~ SOLN
15.0000 [IU] | Freq: Two times a day (BID) | SUBCUTANEOUS | Status: DC
  Administered 2020-02-15: 15 [IU] via SUBCUTANEOUS
  Filled 2020-02-15 (×3): qty 0.15

## 2020-02-15 MED ORDER — INSULIN DETEMIR 100 UNIT/ML FLEXPEN: 30.0000 [IU] | PEN_INJECTOR | Freq: Every day | SUBCUTANEOUS | 11 refills | Status: AC

## 2020-02-15 MED FILL — LEVEMIR FLEXTOUCH 100 UNITS: 100 | 30 days supply | Qty: 9 | Fill #0

## 2020-02-15 MED FILL — FENOFIBRATE 54 MG TABLET: 54 | 30 days supply | Qty: 30 | Fill #0

## 2020-02-15 MED FILL — ACCU-CHEK GUIDE TEST STRIP: 25 days supply | Qty: 100 | Fill #0

## 2020-02-15 MED FILL — ACCU-CHEK GUIDE W/DEVICE KI: W/DEVICE | 1 days supply | Qty: 1 | Fill #0

## 2020-02-15 MED FILL — NOVOLOG FLEXPEN SYRINGE: 100 | 30 days supply | Qty: 12 | Fill #0

## 2020-02-15 MED FILL — LISINOPRIL 10 MG TABS: 10 | 30 days supply | Qty: 30 | Fill #0

## 2020-02-15 NOTE — Progress Notes (Signed)
Inpatient Diabetes Program Recommendations  AACE/ADA: New Consensus Statement on Inpatient Glycemic Control (2015)  Target Ranges:  Prepandial:   less than 140 mg/dL      Peak postprandial:   less than 180 mg/dL (1-2 hours)      Critically ill patients:  140 - 180 mg/dL   Lab Results  Component Value Date   GLUCAP 333 (H) 02/15/2020   HGBA1C 14.1 (H) 02/13/2020    Note: Educated patient on insulin pen use at home. Reviewed contents of insulin flexpen starter kit. Reviewed all steps of insulin pen including attachment of needle, 2-unit air shot, dialing up dose, giving injection, removing needle, disposal of sharps, storage of unused insulin, disposal of insulin etc.  Educated patient that each insulin pen has several doses which could last 3-4 weeks.  Patient able to provide successful return demonstration. Also reviewed troubleshooting with insulin pen. MD to give patient Rxs for insulin pens and insulin pen needles.   Novolog and Levemir with insulin pen needles are at bedside.  No Meter at bedside.  Asked Dr. Marianna Payment to order meter prior to DC.  Order # [id number].    Reviewed hypoglycemia and treatment.    Thank you, Reche Dixon, RN, BSN Diabetes Coordinator Inpatient Diabetes Program [phone] (team pager from 8a-5p)

## 2020-02-15 NOTE — Progress Notes (Signed)
Pt educated about diabetes, hypertension, insulin, and other healthcare related issues.

## 2020-02-15 NOTE — Progress Notes (Signed)
CBG is low pt told to eat a snack. Pt ordered breakfast.

## 2020-02-15 NOTE — TOC Transition Note (Signed)
Transition of Care [facility]) - CM/SW Discharge Note   Patient Details  Name: Theodore Wood MRN: [id number] Date of Birth: [birth year]  [facility]) CM/SW Contact:  Pollie Friar, RN Phone Number: 02/15/2020, 2:08 PM   Clinical Narrative:    Pt discharging home with self care. Pt had discharge medications delivered to the room per [place]. Pt without transport home. CM provided cab voucher for transport. Bedside RN to call when pt is ready.     Final next level of care: Home/Self Care Barriers to Discharge: No Barriers Identified   Patient Goals and CMS Choice Patient states their goals for this hospitalization and ongoing recovery are:: to return home      Discharge Placement                       Discharge Plan and Services                                     Social Determinants of Health (SDOH) Interventions     Readmission Risk Interventions No flowsheet data found.

## 2020-02-15 NOTE — Plan of Care (Signed)

## 2020-02-15 NOTE — Progress Notes (Signed)
Nutrition Education Note  RD consulted for nutrition education regarding new onset diabetes. Patient reports that he has received a lot of education about diabetes. He was appreciative of RD providing handouts and reviewing diet guidelines.   Lab Results  Component Value Date   HGBA1C 14.1 (H) 02/13/2020    RD provided "Carbohydrate Counting for People with Diabetes" handout from the Academy of Nutrition and Dietetics. Discussed different food groups and their effects on blood sugar, emphasizing carbohydrate-containing foods. Provided list of carbohydrates and recommended serving sizes of common foods.  Discussed importance of controlled and consistent carbohydrate intake throughout the day. Provided examples of ways to balance meals/snacks and encouraged intake of high-fiber, whole grain complex carbohydrates. Teach back method used.  Expect good compliance.  Body mass index is 24.8 kg/m. Pt meets criteria for normale based on current BMI.  Current diet order is CHO modified, patient is consuming approximately 80% of meals at this time. Labs and medications reviewed. No further nutrition interventions warranted at this time. RD contact information provided. If additional nutrition issues arise, please re-consult RD.  Molli Barrows, RD, LDN, CNSC Please refer to [facility] for contact information.

## 2020-02-15 NOTE — Progress Notes (Signed)
Subjective: HD#2 Events Overnight: No overnight events  Patient was seen this morning on rounds.  Patient resting comfortably in bed Theodore Wood states he continues to have cramping in his hands, which comes and goes. It has been happening for a couple weeks now. He denies numbness or weakness.   Objective:  Vital signs in last 24 hours: Vitals:   02/14/20 1700 02/14/20 1930 02/14/20 2240 02/15/20 0358  BP: 119/80  113/67 120/89  Pulse: 83 91 74 77  Resp: 20  18 16   Temp:   98.2 F (36.8 C) 98.5 F (36.9 C)  TempSrc:   Oral Oral  SpO2: 100% 100% 100% 100%  Weight:      Height:        Physical Exam: Physical Exam  Constitutional: He is oriented to person, place, and time and well-developed, well-nourished, and in no distress.  HENT:  Head: Normocephalic and atraumatic.  Cardiovascular: Normal rate and intact distal pulses.  Pulmonary/Chest: Effort normal. No respiratory distress. He exhibits no tenderness.  Abdominal: Bowel sounds are normal. He exhibits no distension. There is no abdominal tenderness.  Musculoskeletal:        General: Tenderness (right hand) present. No edema. Normal range of motion.     Cervical back: Normal range of motion.  Neurological: He is alert and oriented to person, place, and time.  Skin: Skin is warm.    Filed Weights   02/13/20 1251  Weight: 63.5 kg     Intake/Output Summary (Last 24 hours) at 02/15/2020 0641 Last data filed at 02/15/2020 0626 Gross per 24 hour  Intake 3020.32 ml  Output 2700 ml  Net 320.32 ml    Pertinent labs/Imaging: CBC Latest Ref Rng & Units 02/15/2020 02/14/2020 02/13/2020  WBC 4.0 - 10.5 K/uL 3.6(L) 3.8(L) 6.6  Hemoglobin 13.0 - 17.0 g/dL 12.0(L) 12.2(L) 15.2  Hematocrit 39.0 - 52.0 % 33.9(L) 34.1(L) 43.0  Platelets 150 - 400 K/uL 202 206 267    CMP Latest Ref Rng & Units 02/15/2020 02/14/2020 02/14/2020  Glucose 70 - 99 mg/dL 298(H) 339(H) 262(H)  BUN 8 - 23 mg/dL 9 11 11   Creatinine 0.61 - 1.24 mg/dL 0.79 0.85  0.95  Sodium 135 - 145 mmol/L 136 134(L) 135  Potassium 3.5 - 5.1 mmol/L 3.4(L) 3.5 3.4(L)  Chloride 98 - 111 mmol/L 104 103 102  CO2 22 - 32 mmol/L 22 22 24   Calcium 8.9 - 10.3 mg/dL 8.6(L) 8.3(L) 8.5(L)  Total Protein 6.5 - 8.1 g/dL - - -  Total Bilirubin 0.3 - 1.2 mg/dL - - -  Alkaline Phos 38 - 126 U/L - - -  AST 15 - 41 U/L - - -  ALT 0 - 44 U/L - - -    No results found.  Assessment/Plan:  Principal Problem:   Severe hyperglycemia due to diabetes mellitus ([facility]) Active Problems:   HTN (hypertension)   Tobacco abuse   Hypertriglyceridemia    Patient Summary: Theodore Wood is a 69 y.o. with pertinent PMH of hypertension hyperlipidemia prostate cancer status post radiation who presented with DKA on hospital day 2  #DKA Patient is doing well today.  We will send him home with 30 units of Levemir nightly and 12 units of NovoLog 3 times daily with meals.  He is stable for discharge today pending talking with the diabetic counselor for further education regarding using insulin.  #GI bleed: #Normocytic anemia, chronic disease Patient's anemia is stable.  He denies any blood in the stool today.  She will need follow-up with his primary care doctor for this issue.  #Hyperlipidemia Started patient on fenofibrate.  Patient will need follow-up with his primary care doctor to adjust his cholesterol medication.  Diet: Carb modified IVF: None VTE: Enoxaparin Code: Full PT/OT recs: None TOC recs: Discharged home   Dispo: Anticipated discharge today.    Marianna Payment, D.O. MCIMTP, PGY-1 Date 02/15/2020 Time 6:41 AM

## 2020-02-18 NOTE — Discharge Summary (Signed)
Name: Theodore Wood MRN: [id number] DOB: [birth year] 69 y.o. PCP: Patient, No Pcp Per  Date of Admission: 02/13/2020 12:45 PM Date of Discharge: 02/15/2020 Attending Physician: Velna Ochs, MD  Discharge Diagnosis: 1. Diabetic Ketoacidosis   Discharge Medications: Allergies as of 02/15/2020   No Known Allergies     Medication List    STOP taking these medications   acetaminophen 325 MG tablet Commonly known as: TYLENOL     TAKE these medications   blood glucose meter kit and supplies Kit Dispense based on patient and insurance preference. Use up to four times daily as directed. (FOR ICD-9 250.00, 250.01).   blood glucose meter kit and supplies Kit Dispense based on patient and insurance preference. Use up to four times daily as directed. (FOR ICD-9 250.00, 250.01).   cetirizine 10 MG tablet Commonly known as: ZYRTEC Take 10 mg by mouth daily.   diclofenac sodium 1 % Gel Commonly known as: VOLTAREN Apply 2 g topically 4 (four) times daily.   fenofibrate 54 MG tablet Take 1 tablet (54 mg total) by mouth daily.   folic acid 1 MG tablet Commonly known as: FOLVITE Take 1 tablet (1 mg total) by mouth daily.   gabapentin 300 MG capsule Commonly known as: NEURONTIN Take 300 mg by mouth 2 (two) times daily.   hydrochlorothiazide 25 MG tablet Commonly known as: HYDRODIURIL Take 25 mg by mouth daily.   insulin aspart 100 UNIT/ML injection Commonly known as: NovoLOG Inject 12 Units into the skin 3 (three) times daily before meals.   insulin detemir 100 UNIT/ML FlexPen Commonly known as: LEVEMIR Inject 30 Units into the skin at bedtime.   lisinopril 10 MG tablet Commonly known as: ZESTRIL Take 1 tablet (10 mg total) by mouth daily.   multivitamin with minerals Tabs tablet Take 1 tablet by mouth daily.   NIFEdipine 30 MG 24 hr tablet Commonly known as: ADALAT CC Take 30 mg by mouth daily.   nitroGLYCERIN 0.3 MG SL tablet Commonly known as:  NITROSTAT Place 1 tablet (0.3 mg total) under the tongue every 5 (five) minutes as needed for chest pain.   oxyCODONE-acetaminophen 5-325 MG tablet Commonly known as: PERCOCET/ROXICET Take 1 tablet by mouth every 6 (six) hours as needed for severe pain.   pantoprazole 40 MG tablet Commonly known as: PROTONIX Take 1 tablet (40 mg total) by mouth daily.   rosuvastatin 40 MG tablet Commonly known as: CRESTOR Take 1 tablet (40 mg total) by mouth daily at 6 PM.   sildenafil 100 MG tablet Commonly known as: VIAGRA Take 100 mg by mouth daily as needed for erectile dysfunction.       Disposition and follow-up:   Mr.Theodore Wood was discharged from [facility] in Good condition.  At the hospital follow up visit please address:  1.  Follow-up:  - DM: patient will need close follow up as this is a new diagnosis. Medication management and diabetic education.  - GI bleed: GI follow up for colonoscopy/EGD  2.  Labs / imaging needed at time of follow-up: BMP, CBC  3.  Pending labs/ test needing follow-up: none  Follow-up Appointments:   1. Patient stated that he has follow up with [facility] Course by problem list: 1. DKA - Patient admitted on 02/13/3030 with significantly elevated blood glucose levels with an anion gap acidosis and ketones. He was treated with IV insulin, fluids and electrolytes without complications and transitioned to subcutaneous insuline. This is a  new diagnosis of DM. Diabetic educator instructed the patient on how to check blood sugar and administer subcutaneous insulin. He was discharged on Basal and meal time insulin with close follow up with PCP.  2. GI bleed: patient presented with acute onset of bright red blood per rectum that resolved without intervention. Patient will needed GI follow up for EGD and colonoscopy.   3. HTN:  Patient presented with a history of HTN. Considering his new diagnosis of DM, he was started on lisinopril. He  will need close follow up with PCP for mediation management.  4. HLD: Patient presented with significantly elevated triglycerides. He was stared on fenofibrate.    Discharge Vitals:   BP (!) 145/91 (BP Location: Left Arm)   Pulse 90   Temp 98.5 F (36.9 C) (Oral)   Resp (!) 21   Ht '5\' 3"'  (1.6 m)   Wt 63.5 kg   SpO2 100%   BMI 24.80 kg/m   Pertinent Labs, Studies, and Procedures:  CBC Latest Ref Rng & Units 02/15/2020 02/14/2020 02/13/2020  WBC 4.0 - 10.5 K/uL 3.6(L) 3.8(L) 6.6  Hemoglobin 13.0 - 17.0 g/dL 12.0(L) 12.2(L) 15.2  Hematocrit 39.0 - 52.0 % 33.9(L) 34.1(L) 43.0  Platelets 150 - 400 K/uL 202 206 267   CMP Latest Ref Rng & Units 02/15/2020 02/15/2020 02/14/2020  Glucose 70 - 99 mg/dL 93 298(H) 339(H)  BUN 8 - 23 mg/dL '8 9 11  ' Creatinine 0.61 - 1.24 mg/dL 0.73 0.79 0.85  Sodium 135 - 145 mmol/L 140 136 134(L)  Potassium 3.5 - 5.1 mmol/L 3.2(L) 3.4(L) 3.5  Chloride 98 - 111 mmol/L 108 104 103  CO2 22 - 32 mmol/L '24 22 22  ' Calcium 8.9 - 10.3 mg/dL 8.6(L) 8.6(L) 8.3(L)  Total Protein 6.5 - 8.1 g/dL - - -  Total Bilirubin 0.3 - 1.2 mg/dL - - -  Alkaline Phos 38 - 126 U/L - - -  AST 15 - 41 U/L - - -  ALT 0 - 44 U/L - - -    Discharge Instructions: Discharge Instructions    Ambulatory referral to Nutrition and Diabetic Education   Complete by: As directed    Diet - low sodium heart healthy   Complete by: As directed    Discharge instructions   Complete by: As directed    You were hospitalized for Diabetic Ketoacidosis. Thank you for allowing [place] to be part of your care.   Please follow up with your PCP within a week to manage these medications.   Please note these changes made to your medications:   Please START taking:  1. Fenofibrate 54 mg daily for high cholesterol 2. Levemir 30 units at bed time 3. Novolog 12 units with meals   4. Lisinopril 10 mg daily  Please STOP taking:    Please make sure to follow up with your PCP and take your diabetes medication as  prescribed.  Please call our clinic if you have any questions or concerns, we may be able to help and keep you from a long and expensive emergency room wait. Our clinic and after hours phone number is [phone], the best time to call is Monday through Friday 9 am to 4 pm but there is always someone available 24/7 if you have an emergency. If you need medication refills please notify your pharmacy one week in advance and they will send [place] a request.   Increase activity slowly   Complete by: As directed  Signed: Marianna Payment, MD 02/18/2020, 3:20 PM   Pager: [phone]

## 2020-03-05 ENCOUNTER — Encounter: Payer: Self-pay | Admitting: Dietician

## 2020-03-05 ENCOUNTER — Other Ambulatory Visit: Payer: Self-pay

## 2020-03-05 ENCOUNTER — Encounter: Payer: No Typology Code available for payment source | Attending: Internal Medicine | Admitting: Dietician

## 2020-03-05 DIAGNOSIS — E119 Type 2 diabetes mellitus without complications: Secondary | ICD-10-CM | POA: Diagnosis present

## 2020-03-05 NOTE — Progress Notes (Signed)
Patient was seen on 03/05/20 for the first of a series of three diabetes self-management courses at [facility]  Patient Education Plan per assessed needs and concerns is to attend three course education program for Diabetes Self Management Education.  The following learning objectives were met by the patient during this class:  Describe diabetes, types of diabetes and pathophysiology  State some common risk factors for diabetes  Defines the role of glucose and insulin  Describe the relationship between diabetes and cardiovascular and other risks  State the members of the Healthcare Team  States the rationale for glucose monitoring and when to test  State their individual [place] the importance of logging glucose readings and how to interpret the readings  Identifies A1C target  Explain the correlation between A1c and eAG values  State symptoms and treatment of high blood glucose and low blood glucose  Explain proper technique for glucose testing and identify proper sharps disposal  Patient experienced a low blood glucose during the class with glucose of 61.  Treated with 3 Glucose tabs and glucose increased to 63.  Treated with 7 oz of regular soda and glucose increased to 83.  Sent patient home with a pack of peanut butter crackers and he also had a protein bar.  He was preparing to give himself an insulin injection of Novolog prior to these snacks.  I instructed him to not give the injection as we were still following the protocol due to his low blood sugar.  He should take the insulin 15 minutes prior to lunch.  He is to take the bus home. He states that his brother (whom he lives with) does not know of his diabetes diagnosis and reports that he has told no one.  Encouraged him to discuss with his brother and other family members as he feels comfortable (especially his brother).  He took 12 units of Novolog prior to breakfast this am  and states that he did not make a mistake in dosing.  He ate a good breakfast with about 21 grams protein and >75 grams of carbohydrate.  Instructed patient to discuss this low with his MD (he sees the [place]) and that his insulin dose may need to be adjusted.  Handouts given during class include:  How to Thrive:  A Guide for Your Journey with Diabetes by the ADA  Meal Plan Card and carbohydrate content list  Dietary intake form  Low Sodium Flavoring Tips  Types of Fats  Dining Out  Label reading  Snack list  Planning a balanced meal  The diabetes portion plate  Diabetes Resources  A1c to eAG Conversion Chart  Blood Glucose Log  Diabetes Recommended Care Schedule  Support Group  Diabetes Success Plan  Core Class Satisfaction Survey   Follow-Up Plan:  Attend core 2

## 2020-03-12 ENCOUNTER — Ambulatory Visit: Payer: No Typology Code available for payment source

## 2020-03-12 ENCOUNTER — Telehealth: Payer: Self-pay | Admitting: Dietician

## 2020-03-12 NOTE — Telephone Encounter (Signed)
Brief Nutrition Note Patient did not attend Core 2 Diabetes class today. Spoke with our front office staff and he had left a message to cancel this appointment. Attempted to call his phone.  Message stated to call back later.  Antonieta Iba, RD, LDN, CDE

## 2020-03-13 ENCOUNTER — Other Ambulatory Visit: Payer: Self-pay

## 2020-03-13 ENCOUNTER — Inpatient Hospital Stay (HOSPITAL_COMMUNITY)
Admission: EM | Admit: 2020-03-13 | Discharge: 2020-03-15 | DRG: 066 | Disposition: A | Discharge status: Home Health | Payer: No Typology Code available for payment source | Attending: Internal Medicine | Admitting: Internal Medicine

## 2020-03-13 ENCOUNTER — Encounter (HOSPITAL_COMMUNITY): Payer: Self-pay

## 2020-03-13 ENCOUNTER — Emergency Department (HOSPITAL_COMMUNITY): Payer: No Typology Code available for payment source

## 2020-03-13 DIAGNOSIS — K219 Gastro-esophageal reflux disease without esophagitis: Secondary | ICD-10-CM | POA: Diagnosis present

## 2020-03-13 DIAGNOSIS — R4781 Slurred speech: Secondary | ICD-10-CM | POA: Diagnosis present

## 2020-03-13 DIAGNOSIS — Z794 Long term (current) use of insulin: Secondary | ICD-10-CM

## 2020-03-13 DIAGNOSIS — E785 Hyperlipidemia, unspecified: Secondary | ICD-10-CM | POA: Diagnosis present

## 2020-03-13 DIAGNOSIS — I10 Essential (primary) hypertension: Secondary | ICD-10-CM | POA: Diagnosis present

## 2020-03-13 DIAGNOSIS — H532 Diplopia: Secondary | ICD-10-CM | POA: Diagnosis not present

## 2020-03-13 DIAGNOSIS — E7800 Pure hypercholesterolemia, unspecified: Secondary | ICD-10-CM | POA: Diagnosis present

## 2020-03-13 DIAGNOSIS — Z823 Family history of stroke: Secondary | ICD-10-CM

## 2020-03-13 DIAGNOSIS — Z809 Family history of malignant neoplasm, unspecified: Secondary | ICD-10-CM

## 2020-03-13 DIAGNOSIS — E11319 Type 2 diabetes mellitus with unspecified diabetic retinopathy without macular edema: Secondary | ICD-10-CM | POA: Diagnosis present

## 2020-03-13 DIAGNOSIS — Z8546 Personal history of malignant neoplasm of prostate: Secondary | ICD-10-CM

## 2020-03-13 DIAGNOSIS — Z720 Tobacco use: Secondary | ICD-10-CM | POA: Diagnosis present

## 2020-03-13 DIAGNOSIS — F17210 Nicotine dependence, cigarettes, uncomplicated: Secondary | ICD-10-CM | POA: Diagnosis present

## 2020-03-13 DIAGNOSIS — E1151 Type 2 diabetes mellitus with diabetic peripheral angiopathy without gangrene: Secondary | ICD-10-CM | POA: Diagnosis present

## 2020-03-13 DIAGNOSIS — E119 Type 2 diabetes mellitus without complications: Secondary | ICD-10-CM

## 2020-03-13 DIAGNOSIS — I639 Cerebral infarction, unspecified: Principal | ICD-10-CM | POA: Diagnosis present

## 2020-03-13 DIAGNOSIS — R29703 NIHSS score 3: Secondary | ICD-10-CM | POA: Diagnosis present

## 2020-03-13 DIAGNOSIS — Z20822 Contact with and (suspected) exposure to covid-19: Secondary | ICD-10-CM | POA: Diagnosis present

## 2020-03-13 LAB — DIFFERENTIAL
Abs Immature Granulocytes: 0.05 10*3/uL (ref 0.00–0.07)
Basophils Absolute: 0 10*3/uL (ref 0.0–0.1)
Basophils Relative: 1 %
Eosinophils Absolute: 0.1 10*3/uL (ref 0.0–0.5)
Eosinophils Relative: 1 %
Immature Granulocytes: 1 %
Lymphocytes Relative: 37 %
Lymphs Abs: 2.3 10*3/uL (ref 0.7–4.0)
Monocytes Absolute: 1.1 10*3/uL — ABNORMAL HIGH (ref 0.1–1.0)
Monocytes Relative: 18 %
Neutro Abs: 2.7 10*3/uL (ref 1.7–7.7)
Neutrophils Relative %: 42 %

## 2020-03-13 LAB — I-STAT CHEM 8, ED
BUN: 13 mg/dL (ref 8–23)
Calcium, Ion: 1.19 mmol/L (ref 1.15–1.40)
Chloride: 106 mmol/L (ref 98–111)
Creatinine, Ser: 0.8 mg/dL (ref 0.61–1.24)
Glucose, Bld: 95 mg/dL (ref 70–99)
HCT: 44 % (ref 39.0–52.0)
Hemoglobin: 15 g/dL (ref 13.0–17.0)
Potassium: 3.8 mmol/L (ref 3.5–5.1)
Sodium: 143 mmol/L (ref 135–145)
TCO2: 27 mmol/L (ref 22–32)

## 2020-03-13 LAB — CBC
HCT: 45.1 % (ref 39.0–52.0)
Hemoglobin: 14.7 g/dL (ref 13.0–17.0)
MCH: 29.2 pg (ref 26.0–34.0)
MCHC: 32.6 g/dL (ref 30.0–36.0)
MCV: 89.5 fL (ref 80.0–100.0)
Platelets: 369 10*3/uL (ref 150–400)
RBC: 5.04 MIL/uL (ref 4.22–5.81)
RDW: 14.1 % (ref 11.5–15.5)
WBC: 6.2 10*3/uL (ref 4.0–10.5)
nRBC: 0 % (ref 0.0–0.2)

## 2020-03-13 LAB — COMPREHENSIVE METABOLIC PANEL
ALT: 18 U/L (ref 0–44)
AST: 19 U/L (ref 15–41)
Albumin: 4.2 g/dL (ref 3.5–5.0)
Alkaline Phosphatase: 68 U/L (ref 38–126)
Anion gap: 13 (ref 5–15)
BUN: 11 mg/dL (ref 8–23)
CO2: 22 mmol/L (ref 22–32)
Calcium: 9.9 mg/dL (ref 8.9–10.3)
Chloride: 106 mmol/L (ref 98–111)
Creatinine, Ser: 0.9 mg/dL (ref 0.61–1.24)
GFR calc Af Amer: >60 mL/min (ref >60)
GFR calc non Af Amer: >60 mL/min (ref >60)
Glucose, Bld: 93 mg/dL (ref 70–99)
Potassium: 3.7 mmol/L (ref 3.5–5.1)
Sodium: 141 mmol/L (ref 135–145)
Total Bilirubin: 0.5 mg/dL (ref 0.3–1.2)
Total Protein: 8 g/dL (ref 6.5–8.1)

## 2020-03-13 LAB — CBG MONITORING, ED: Glucose-Capillary: 90 mg/dL (ref 70–99)

## 2020-03-13 LAB — PROTIME-INR
INR: 1 (ref 0.8–1.2)
Prothrombin Time: 12.7 seconds (ref 11.4–15.2)

## 2020-03-13 LAB — GLUCOSE, CAPILLARY
Glucose-Capillary: 64 mg/dL — ABNORMAL LOW (ref 70–99)
Glucose-Capillary: 104 mg/dL — ABNORMAL HIGH (ref 70–99)

## 2020-03-13 LAB — APTT: aPTT: 27 seconds (ref 24–36)

## 2020-03-13 LAB — SARS CORONAVIRUS 2 (TAT 6-24 HRS): SARS Coronavirus 2: NEGATIVE

## 2020-03-13 LAB — ETHANOL: Alcohol, Ethyl (B): <10 mg/dL (ref <10)

## 2020-03-13 MED ORDER — INSULIN ASPART 100 UNIT/ML ~~LOC~~ SOLN: 0.0000 [IU] | Freq: Every day | SUBCUTANEOUS | Status: DC

## 2020-03-13 MED ORDER — ROSUVASTATIN CALCIUM 20 MG PO TABS
40.0000 mg | ORAL_TABLET | Freq: Every day | ORAL | Status: DC
  Administered 2020-03-13 – 2020-03-14 (×2): 40 mg via ORAL
  Filled 2020-03-13: qty 2
  Filled 2020-03-13: qty 8
  Filled 2020-03-13 (×2): qty 2

## 2020-03-13 MED ORDER — SENNOSIDES-DOCUSATE SODIUM 8.6-50 MG PO TABS: 1.0000 | ORAL_TABLET | Freq: Every evening | ORAL | Status: DC | PRN

## 2020-03-13 MED ORDER — INSULIN ASPART 100 UNIT/ML ~~LOC~~ SOLN
0.0000 [IU] | Freq: Three times a day (TID) | SUBCUTANEOUS | Status: DC
  Administered 2020-03-14 (×2): 2 [IU] via SUBCUTANEOUS

## 2020-03-13 MED ORDER — ENOXAPARIN SODIUM 40 MG/0.4ML ~~LOC~~ SOLN
40.0000 mg | SUBCUTANEOUS | Status: DC
  Administered 2020-03-13 – 2020-03-14 (×2): 40 mg via SUBCUTANEOUS
  Filled 2020-03-13 (×2): qty 0.4

## 2020-03-13 MED ORDER — STROKE: EARLY STAGES OF RECOVERY BOOK
Freq: Once | Status: AC
  Filled 2020-03-13: qty 1

## 2020-03-13 MED ORDER — ACETAMINOPHEN 325 MG PO TABS
650.0000 mg | ORAL_TABLET | ORAL | Status: DC | PRN
  Administered 2020-03-14: 650 mg via ORAL
  Filled 2020-03-13: qty 2

## 2020-03-13 MED ORDER — ACETAMINOPHEN 160 MG/5ML PO SOLN: 650.0000 mg | ORAL | Status: DC | PRN

## 2020-03-13 MED ORDER — FENOFIBRATE 54 MG PO TABS
54.0000 mg | ORAL_TABLET | Freq: Every day | ORAL | Status: DC
  Administered 2020-03-14 – 2020-03-15 (×2): 54 mg via ORAL
  Filled 2020-03-13 (×2): qty 1

## 2020-03-13 MED ORDER — IOHEXOL 350 MG/ML SOLN
100.0000 mL | Freq: Once | INTRAVENOUS | Status: AC | PRN
  Administered 2020-03-13: 100 mL via INTRAVENOUS

## 2020-03-13 MED ORDER — PANTOPRAZOLE SODIUM 40 MG PO TBEC
40.0000 mg | DELAYED_RELEASE_TABLET | Freq: Every day | ORAL | Status: DC
  Administered 2020-03-13 – 2020-03-15 (×3): 40 mg via ORAL
  Filled 2020-03-13 (×3): qty 1

## 2020-03-13 MED ORDER — ONDANSETRON HCL 4 MG/2ML IJ SOLN
INTRAMUSCULAR | Status: AC
  Filled 2020-03-13: qty 2

## 2020-03-13 MED ORDER — MECLIZINE HCL 25 MG PO TABS
25.0000 mg | ORAL_TABLET | Freq: Once | ORAL | Status: AC
  Administered 2020-03-13: 25 mg via ORAL
  Filled 2020-03-13: qty 1

## 2020-03-13 MED ORDER — ASPIRIN EC 325 MG PO TBEC
650.0000 mg | DELAYED_RELEASE_TABLET | Freq: Once | ORAL | Status: AC
  Administered 2020-03-13: 650 mg via ORAL
  Filled 2020-03-13: qty 2

## 2020-03-13 MED ORDER — ACETAMINOPHEN 650 MG RE SUPP: 650.0000 mg | RECTAL | Status: DC | PRN

## 2020-03-13 MED ORDER — GADOBUTROL 1 MMOL/ML IV SOLN
9.0000 mL | Freq: Once | INTRAVENOUS | Status: AC | PRN
  Administered 2020-03-13: 9 mL via INTRAVENOUS

## 2020-03-13 MED ORDER — ASPIRIN EC 81 MG PO TBEC
81.0000 mg | DELAYED_RELEASE_TABLET | Freq: Every day | ORAL | Status: DC
  Administered 2020-03-14 – 2020-03-15 (×2): 81 mg via ORAL
  Filled 2020-03-13 (×2): qty 1

## 2020-03-13 NOTE — H&P (Addendum)
History and Physical    Theodore Wood SVX:[id number] DOB: February 16, 1951 DOA: 03/13/2020  PCP: Theodore Wood, No Pcp Per  Theodore Wood coming from: Home  I have personally briefly reviewed Theodore Wood's old medical records in [place]  Chief Complaint: Right eye vision loss, trouble walking and dizziness started this morning.  HPI: Theodore Wood is a 69 y.o. male with medical history significant of hypertension, hyperlipidemia, GERD, diabetes mellitus presents to emergency department due to sudden onset of right-sided vision loss and gait unsteadiness started 7 AM this morning.  Theodore Wood tells me that he was doing well last night and when he woke up he noticed right eye vision changes as well as dizziness and trouble walking this morning so he decided to come to the ER for further evaluation and management.  No head trauma, loss of consciousness, seizures, headache, chest pain, shortness of breath, palpitation, leg swelling, fever, chills, cough, congestion, urinary or bowel changes.  Lives with his brother at home.  Smokes cigarettes once in a while.  He tells me that he quit alcohol intake and denies illicit drug use.    ED Course: Code stroke was called in triage.  In ED: Theodore Wood's blood pressure elevated.  Initial work-up such as CBC, ethanol, PT/INR, CMP came back within normal limit.  CBC shows leukopenia of 3.6.  UA, UDS, COVID-19 pending.  CT head without contrast, CTA head and neck and CT cerebral perfusion with contrast was ordered which showed moderate stenosis supraclinoid internal carotid artery bilaterally due to atherosclerotic disease.  EDP consulted neurology for further evaluation and management.  TPA not given due to out of window.  [facility] hospitalist consulted for admission for stroke work-up.  Review of Systems: As per HPI otherwise negative.    Past Medical History:  Diagnosis Date  . Angina   . Diabetes mellitus without complication ([place])   . GERD (gastroesophageal reflux  disease)   . High cholesterol   . Hypertension   . Prostate cancer [facility])     Past Surgical History:  Procedure Laterality Date  . CARDIAC CATHETERIZATION  1990's  . PROSTATE BIOPSY       reports that he has quit smoking. His smoking use included cigarettes. He has a 7.50 pack-year smoking history. He has never used smokeless tobacco. He reports current alcohol use of about 42.0 standard drinks of alcohol per week. He reports current drug use. Drugs: Cocaine, Marijuana, and Heroin.  No Known Allergies  Family History  Problem Relation Age of Onset  . Stroke Mother   . Stroke Father   . Stroke Brother   . Cancer Maternal Aunt        unknown type    Prior to Admission medications   Medication Sig Start Date End Date Taking? Authorizing Provider  blood glucose meter kit and supplies KIT Dispense based on Theodore Wood and insurance preference. Use up to four times daily as directed. (FOR ICD-9 250.00, 250.01). Theodore Wood taking differently: 1 each by Other route See admin instructions. Dispense based on Theodore Wood and insurance preference. Use up to four times daily as directed. (FOR ICD-9 250.00, 250.01). 02/14/20  Yes Marianna Payment, MD  fenofibrate 54 MG tablet Take 1 tablet (54 mg total) by mouth daily. 02/15/20 03/16/20 Yes Marianna Payment, MD  insulin aspart (NOVOLOG) 100 UNIT/ML injection Inject 12 Units into the skin 3 (three) times daily before meals. 02/15/20 03/16/20 Yes Marianna Payment, MD  insulin detemir (LEVEMIR) 100 UNIT/ML FlexPen Inject 30 Units into the skin at bedtime.  02/15/20  Yes Marianna Payment, MD  lisinopril (ZESTRIL) 10 MG tablet Take 1 tablet (10 mg total) by mouth daily. 02/16/20 03/17/20 Yes Marianna Payment, MD  metFORMIN (GLUCOPHAGE) 500 MG tablet Take 500 mg by mouth daily before breakfast.   Yes [provider]  nitroGLYCERIN (NITROSTAT) 0.3 MG SL tablet Place 1 tablet (0.3 mg total) under the tongue every 5 (five) minutes as needed for chest pain. 01/13/16  Yes [name], MD   blood glucose meter kit and supplies KIT Dispense based on Theodore Wood and insurance preference. Use up to four times daily as directed. (FOR ICD-9 250.00, 250.01). Theodore Wood not taking: Reported on 03/13/2020 02/15/20   Marianna Payment, MD  diclofenac sodium (VOLTAREN) 1 % GEL Apply 2 g topically 4 (four) times daily. Theodore Wood not taking: Reported on 12/30/2018 01/13/16   [name], MD  folic acid (FOLVITE) 1 MG tablet Take 1 tablet (1 mg total) by mouth daily. Theodore Wood not taking: Reported on 12/30/2018 01/13/16   [name], MD  Multiple Vitamin (MULTIVITAMIN WITH MINERALS) TABS tablet Take 1 tablet by mouth daily. Theodore Wood not taking: Reported on 12/30/2018 01/13/16   [name], MD  oxyCODONE-acetaminophen (PERCOCET/ROXICET) 5-325 MG tablet Take 1 tablet by mouth every 6 (six) hours as needed for severe pain. Theodore Wood not taking: Reported on 12/30/2018 11/25/17   Hedges, Dellis Filbert, PA-C  pantoprazole (PROTONIX) 40 MG tablet Take 1 tablet (40 mg total) by mouth daily. Theodore Wood not taking: Reported on 12/30/2018 01/13/16   [name], MD  rosuvastatin (CRESTOR) 40 MG tablet Take 1 tablet (40 mg total) by mouth daily at 6 PM. Theodore Wood not taking: Reported on 12/30/2018 01/13/16   [name], MD    Physical Exam: Vitals:   03/13/20 1255 03/13/20 1400  BP: (!) 183/84 (!) 153/87  Pulse: 90 83  Resp: 20 (!) 22  Temp: 98.4 F (36.9 C)   SpO2: 96%   Height: '5\' 6"'  (1.676 m)     Constitutional: NAD, calm, comfortable, communicating well Eyes: PERRL,  lids and conjunctivae normal ENMT: Mucous membranes are moist. Posterior pharynx clear of any exudate or lesions.Normal dentition.  Neck: normal, supple, no masses, no thyromegaly Respiratory: clear to auscultation bilaterally, no wheezing, no crackles. Normal respiratory effort. No accessory muscle use.  Cardiovascular: Regular rate and rhythm, no murmurs / rubs / gallops. No extremity edema. 2+ pedal pulses. No carotid bruits.  Abdomen: no tenderness, no  masses palpated. No hepatosplenomegaly. Bowel sounds positive.  Musculoskeletal: no clubbing / cyanosis. No joint deformity upper and lower extremities. Good ROM, no contractures. Normal muscle tone.  Skin: no rashes, lesions, ulcers. No induration Neurologic: Impaired vision in right eye.  Able to count fingers, not able to read.  Sensation intact, DTR normal. Strength 5/5 in all 4.  Psychiatric: Normal judgment and insight. Alert and oriented x 3. Normal mood.    Labs on Admission: I have personally reviewed following labs and imaging studies  CBC: Recent Labs  Lab 03/13/20 1254 03/13/20 1300  WBC 6.2  --   NEUTROABS 2.7  --   HGB 14.7 15.0  HCT 45.1 44.0  MCV 89.5  --   PLT 369  --    Basic Metabolic Panel: Recent Labs  Lab 03/13/20 1254 03/13/20 1300  NA 141 143  K 3.7 3.8  CL 106 106  CO2 22  --   GLUCOSE 93 95  BUN 11 13  CREATININE 0.90 0.80  CALCIUM 9.9  --    GFR: Estimated Creatinine Clearance: 80.9  mL/min (by C-G formula based on SCr of 0.8 mg/dL). Liver Function Tests: Recent Labs  Lab 03/13/20 1254  AST 19  ALT 18  ALKPHOS 68  BILITOT 0.5  PROT 8.0  ALBUMIN 4.2   No results for input(s): LIPASE, AMYLASE in the last 168 hours. No results for input(s): AMMONIA in the last 168 hours. Coagulation Profile: Recent Labs  Lab 03/13/20 1254  INR 1.0   Cardiac Enzymes: No results for input(s): CKTOTAL, CKMB, CKMBINDEX, TROPONINI in the last 168 hours. BNP (last 3 results) No results for input(s): PROBNP in the last 8760 hours. HbA1C: No results for input(s): HGBA1C in the last 72 hours. CBG: Recent Labs  Lab 03/13/20 1253  GLUCAP 90   Lipid Profile: No results for input(s): CHOL, HDL, LDLCALC, TRIG, CHOLHDL, LDLDIRECT in the last 72 hours. Thyroid Function Tests: No results for input(s): TSH, T4TOTAL, FREET4, T3FREE, THYROIDAB in the last 72 hours. Anemia Panel: No results for input(s): VITAMINB12, FOLATE, FERRITIN, TIBC, IRON, RETICCTPCT in  the last 72 hours. Urine analysis:    Component Value Date/Time   COLORURINE STRAW (A) 02/13/2020 1430   APPEARANCEUR CLEAR 02/13/2020 1430   LABSPEC 1.026 02/13/2020 1430   PHURINE 5.0 02/13/2020 1430   GLUCOSEU >=500 (A) 02/13/2020 1430   HGBUR NEGATIVE 02/13/2020 1430   BILIRUBINUR NEGATIVE 02/13/2020 1430   KETONESUR 20 (A) 02/13/2020 1430   PROTEINUR NEGATIVE 02/13/2020 1430   UROBILINOGEN 0.2 02/03/2012 0752   NITRITE NEGATIVE 02/13/2020 1430   LEUKOCYTESUR NEGATIVE 02/13/2020 1430    Radiological Exams on Admission: CT ANGIO HEAD W OR WO CONTRAST  Result Date: 03/13/2020 CLINICAL DATA:  Stroke. Vomiting and dizziness. Right-sided weakness. Blurred vision EXAM: CT ANGIOGRAPHY HEAD AND NECK CT PERFUSION BRAIN TECHNIQUE: Multidetector CT imaging of the head and neck was performed using the standard protocol during bolus administration of intravenous contrast. Multiplanar CT image reconstructions and MIPs were obtained to evaluate the vascular anatomy. Carotid stenosis measurements (when applicable) are obtained utilizing NASCET criteria, using the distal internal carotid diameter as the denominator. Multiphase CT imaging of the brain was performed following IV bolus contrast injection. Subsequent parametric perfusion maps were calculated using RAPID software. CONTRAST:  154m OMNIPAQUE IOHEXOL 350 MG/ML SOLN COMPARISON:  CT head earlier today FINDINGS: CTA NECK FINDINGS Aortic arch: Atherosclerotic aortic arch. Proximal great vessels widely patent. Three-vessel arch. Right carotid system: Right carotid bifurcation widely patent without significant stenosis. Left carotid system: Minimal atherosclerotic calcification left carotid bifurcation without significant stenosis. Vertebral arteries: Both vertebral arteries widely patent without stenosis. Skeleton: Negative Other neck: Negative for mass or adenopathy Upper chest: Lung apices clear bilaterally. Review of the MIP images confirms the above  findings CTA HEAD FINDINGS Anterior circulation: Moderate stenosis of the supraclinoid internal carotid artery bilaterally due to atherosclerotic disease. Anterior and middle cerebral arteries patent without large vessel occlusion or significant stenosis. Posterior circulation: Both vertebral arteries patent to the basilar. PICA patent bilaterally. Basilar widely patent. Superior cerebellar and posterior cerebral arteries patent bilaterally without stenosis. Venous sinuses: Normal venous enhancement. Anatomic variants: None Review of the MIP images confirms the above findings CT Brain Perfusion Findings: ASPECTS: 9 CBF (<30%) Volume: 0  mL Perfusion (Tmax>6.0s) volume: 066mMismatch Volume: 62m562mnfarction Location:none IMPRESSION: 1. Negative for emergent large vessel occlusion. 2. Moderate stenosis supraclinoid internal carotid artery bilaterally due to atherosclerotic disease. 3. No significant carotid or vertebral artery stenosis in the neck. 4. CT perfusion negative for infarct or delayed perfusion 5. These results were called by  telephone at the time of interpretation on 03/13/2020 at 1:44 pm to provider Lindzen , who verbally acknowledged these results. Electronically Signed   By: Franchot Gallo M.D.   On: 03/13/2020 13:45   CT ANGIO NECK W OR WO CONTRAST  Result Date: 03/13/2020 CLINICAL DATA:  Stroke. Vomiting and dizziness. Right-sided weakness. Blurred vision EXAM: CT ANGIOGRAPHY HEAD AND NECK CT PERFUSION BRAIN TECHNIQUE: Multidetector CT imaging of the head and neck was performed using the standard protocol during bolus administration of intravenous contrast. Multiplanar CT image reconstructions and MIPs were obtained to evaluate the vascular anatomy. Carotid stenosis measurements (when applicable) are obtained utilizing NASCET criteria, using the distal internal carotid diameter as the denominator. Multiphase CT imaging of the brain was performed following IV bolus contrast injection. Subsequent  parametric perfusion maps were calculated using RAPID software. CONTRAST:  137m OMNIPAQUE IOHEXOL 350 MG/ML SOLN COMPARISON:  CT head earlier today FINDINGS: CTA NECK FINDINGS Aortic arch: Atherosclerotic aortic arch. Proximal great vessels widely patent. Three-vessel arch. Right carotid system: Right carotid bifurcation widely patent without significant stenosis. Left carotid system: Minimal atherosclerotic calcification left carotid bifurcation without significant stenosis. Vertebral arteries: Both vertebral arteries widely patent without stenosis. Skeleton: Negative Other neck: Negative for mass or adenopathy Upper chest: Lung apices clear bilaterally. Review of the MIP images confirms the above findings CTA HEAD FINDINGS Anterior circulation: Moderate stenosis of the supraclinoid internal carotid artery bilaterally due to atherosclerotic disease. Anterior and middle cerebral arteries patent without large vessel occlusion or significant stenosis. Posterior circulation: Both vertebral arteries patent to the basilar. PICA patent bilaterally. Basilar widely patent. Superior cerebellar and posterior cerebral arteries patent bilaterally without stenosis. Venous sinuses: Normal venous enhancement. Anatomic variants: None Review of the MIP images confirms the above findings CT Brain Perfusion Findings: ASPECTS: 9 CBF (<30%) Volume: 0  mL Perfusion (Tmax>6.0s) volume: 086mMismatch Volume: 75m275mnfarction Location:none IMPRESSION: 1. Negative for emergent large vessel occlusion. 2. Moderate stenosis supraclinoid internal carotid artery bilaterally due to atherosclerotic disease. 3. No significant carotid or vertebral artery stenosis in the neck. 4. CT perfusion negative for infarct or delayed perfusion 5. These results were called by telephone at the time of interpretation on 03/13/2020 at 1:44 pm to provider Lindzen , who verbally acknowledged these results. Electronically Signed   By: ChaFranchot GalloD.   On: 03/13/2020  13:45   MR Brain W and Wo Contrast  Result Date: 03/13/2020 CLINICAL DATA:  Ataxia.  Possible stroke.  Monocular vision loss. EXAM: MRI HEAD AND ORBITS WITHOUT AND WITH CONTRAST TECHNIQUE: Multiplanar, multiecho pulse sequences of the brain and surrounding structures were obtained without and with intravenous contrast. Multiplanar, multiecho pulse sequences of the orbits and surrounding structures were obtained including fat saturation techniques, before and after intravenous contrast administration. CONTRAST:  9mL93mDAVIST GADOBUTROL 1 MMOL/ML IV SOLN COMPARISON:  CT studies same day. FINDINGS: MRI HEAD FINDINGS Brain: Diffusion imaging does not show any acute or subacute infarction. There is mild chronic small-vessel change of the pons. No focal cerebellar finding. Cerebral hemispheres show chronic atrophy and gliosis of the inferior frontal low on the right which could be due to an old stroke or old head trauma. Moderate chronic small-vessel ischemic changes elsewhere affecting the cerebral hemispheric white matter. No mass lesion, hemorrhage, hydrocephalus or extra-axial collection. Vascular: Major vessels at the base of the brain show flow. Skull and upper cervical spine: Negative Other: None MRI ORBITS FINDINGS Orbits: Both globes appear normal. Optic nerves appear normal. Orbital fat is  normal. Extra-ocular muscles are normal. Lacrimal glands are normal. Visualized sinuses: No active inflammatory disease. Soft tissues: Negative Limited intracranial: CT brain exam. IMPRESSION: No acute intracranial finding.  No recent stroke. Moderate chronic small-vessel ischemic changes of the pons and cerebral hemispheric white matter. Atrophy and gliosis of the inferior frontal lobe on the right, probably secondary to distant closed head injury. No orbital finding. Electronically Signed   By: Nelson Chimes M.D.   On: 03/13/2020 15:56   CT CEREBRAL PERFUSION W CONTRAST  Result Date: 03/13/2020 CLINICAL DATA:  Stroke.  Vomiting and dizziness. Right-sided weakness. Blurred vision EXAM: CT ANGIOGRAPHY HEAD AND NECK CT PERFUSION BRAIN TECHNIQUE: Multidetector CT imaging of the head and neck was performed using the standard protocol during bolus administration of intravenous contrast. Multiplanar CT image reconstructions and MIPs were obtained to evaluate the vascular anatomy. Carotid stenosis measurements (when applicable) are obtained utilizing NASCET criteria, using the distal internal carotid diameter as the denominator. Multiphase CT imaging of the brain was performed following IV bolus contrast injection. Subsequent parametric perfusion maps were calculated using RAPID software. CONTRAST:  181m OMNIPAQUE IOHEXOL 350 MG/ML SOLN COMPARISON:  CT head earlier today FINDINGS: CTA NECK FINDINGS Aortic arch: Atherosclerotic aortic arch. Proximal great vessels widely patent. Three-vessel arch. Right carotid system: Right carotid bifurcation widely patent without significant stenosis. Left carotid system: Minimal atherosclerotic calcification left carotid bifurcation without significant stenosis. Vertebral arteries: Both vertebral arteries widely patent without stenosis. Skeleton: Negative Other neck: Negative for mass or adenopathy Upper chest: Lung apices clear bilaterally. Review of the MIP images confirms the above findings CTA HEAD FINDINGS Anterior circulation: Moderate stenosis of the supraclinoid internal carotid artery bilaterally due to atherosclerotic disease. Anterior and middle cerebral arteries patent without large vessel occlusion or significant stenosis. Posterior circulation: Both vertebral arteries patent to the basilar. PICA patent bilaterally. Basilar widely patent. Superior cerebellar and posterior cerebral arteries patent bilaterally without stenosis. Venous sinuses: Normal venous enhancement. Anatomic variants: None Review of the MIP images confirms the above findings CT Brain Perfusion Findings: ASPECTS: 9 CBF  (<30%) Volume: 0  mL Perfusion (Tmax>6.0s) volume: 0683mMismatch Volume: 83m65mnfarction Location:none IMPRESSION: 1. Negative for emergent large vessel occlusion. 2. Moderate stenosis supraclinoid internal carotid artery bilaterally due to atherosclerotic disease. 3. No significant carotid or vertebral artery stenosis in the neck. 4. CT perfusion negative for infarct or delayed perfusion 5. These results were called by telephone at the time of interpretation on 03/13/2020 at 1:44 pm to provider Lindzen , who verbally acknowledged these results. Electronically Signed   By: ChaFranchot GalloD.   On: 03/13/2020 13:45   CT HEAD CODE STROKE WO CONTRAST  Result Date: 03/13/2020 CLINICAL DATA:  Code stroke. Vomiting dizziness. Right-sided weakness and blurred vision. EXAM: CT HEAD WITHOUT CONTRAST TECHNIQUE: Contiguous axial images were obtained from the base of the skull through the vertex without intravenous contrast. COMPARISON:  None. FINDINGS: Brain: Mild atrophy. Low-density in the right frontal lobe above the orbit. Possible infarct of indeterminate age. Possible vasogenic edema related to mass lesion. Patchy white matter hypodensity bilaterally compatible with chronic microvascular ischemia. Negative for acute hemorrhage. Vascular: Negative for hyperdense vessel Skull: Negative Sinuses/Orbits: Mild mucosal edema paranasal sinuses. Negative orbit Other: None ASPECTS ([place] Program Early CT Score) - Ganglionic level infarction (caudate, lentiform nuclei, internal capsule, insula, M1-M3 cortex): 6 - Supraganglionic infarction (M4-M6 cortex): 3 Total score (0-10 with 10 being normal): None IMPRESSION: 1. Hypodensity right inferior frontal lobe of indeterminate etiology. No prior  studies. Differential includes acute or chronic infarct as well as mass lesion. Recommend MRI brain without with contrast 2. ASPECTS is 9 due to possible infarct in the right frontal lobe. 3. Results texted to Dr. Cheral Marker.  Electronically Signed   By: Franchot Gallo M.D.   On: 03/13/2020 13:23   MR ORBITS W WO CONTRAST  Result Date: 03/13/2020 CLINICAL DATA:  Ataxia.  Possible stroke.  Monocular vision loss. EXAM: MRI HEAD AND ORBITS WITHOUT AND WITH CONTRAST TECHNIQUE: Multiplanar, multiecho pulse sequences of the brain and surrounding structures were obtained without and with intravenous contrast. Multiplanar, multiecho pulse sequences of the orbits and surrounding structures were obtained including fat saturation techniques, before and after intravenous contrast administration. CONTRAST:  74m GADAVIST GADOBUTROL 1 MMOL/ML IV SOLN COMPARISON:  CT studies same day. FINDINGS: MRI HEAD FINDINGS Brain: Diffusion imaging does not show any acute or subacute infarction. There is mild chronic small-vessel change of the pons. No focal cerebellar finding. Cerebral hemispheres show chronic atrophy and gliosis of the inferior frontal low on the right which could be due to an old stroke or old head trauma. Moderate chronic small-vessel ischemic changes elsewhere affecting the cerebral hemispheric white matter. No mass lesion, hemorrhage, hydrocephalus or extra-axial collection. Vascular: Major vessels at the base of the brain show flow. Skull and upper cervical spine: Negative Other: None MRI ORBITS FINDINGS Orbits: Both globes appear normal. Optic nerves appear normal. Orbital fat is normal. Extra-ocular muscles are normal. Lacrimal glands are normal. Visualized sinuses: No active inflammatory disease. Soft tissues: Negative Limited intracranial: CT brain exam. IMPRESSION: No acute intracranial finding.  No recent stroke. Moderate chronic small-vessel ischemic changes of the pons and cerebral hemispheric white matter. Atrophy and gliosis of the inferior frontal lobe on the right, probably secondary to distant closed head injury. No orbital finding. Electronically Signed   By: MNelson ChimesM.D.   On: 03/13/2020 15:56    EKG: Independently  reviewed.  Sinus rhythm, no ST-T wave changes noted.  Assessment/Plan Principal Problem:   CVA (cerebral vascular accident) ([place] Active Problems:   HTN (hypertension)   Hyperlipidemia   Tobacco abuse   DM (diabetes mellitus) ([place]   GERD (gastroesophageal reflux disease)    TIA/Ischemic stroke: -Theodore Wood presents with sudden onset of right vision changes & ataxia.  Risk factors including hypertension, hyperlipidemia, tobacco abuse, diabetes mellitus. -Reviewed CT head, CT angiogram of head and neck, cerebral perfusion, MRI brain and MRI orbit. -Admit Theodore Wood for telemetry monitoring. -Stroke protocol -Allow for permissive hypertension for the first 24-48h - only treat PRN if SBP >>947mmHg or diastolic blood pressure >>654 Blood pressures can be gradually normalized to SBP<140 upon discharge. -Ordered lipid panel, A1c and echocardiogram to- rule out PFO -Frequent neuro checks -Start on aspirin and continue rosuvastatin -Risk factor modification -Consult Neurology-appreciate help -PT/OT eval, Speech consult -We'll keep him n.p.o. until he passes a bedside swallow evaluation.  Hypertension: Blood pressure is elevated.   -Hold home BP meds-lisinopril for now to allow permissive hypertension.  Hyperlipidemia: Continue statin and fenofibrate  Diabetes mellitus: Check A1c  -Blood glucose in 90s  -Hold Theodore Wood NovoLog, Levemir and Metformin and start Theodore Wood on sliding scale insulin  GERD: Continue PPI  Tobacco abuse: Counseled about cessation.  DVT prophylaxis: Lovenox/SCD/TED Code Status: Full code Family Communication: None present at bedside.  Plan of care discussed with Theodore Wood in length and he verbalized understanding and agreed with it. Disposition Plan: To be determined  consults called: Neurology by EDP Admission status:  Observation  Mckinley Jewel MD [facility] Pager [phone]  If 7PM-7AM, please contact night-coverage [email or web address] Password  [facility]  03/13/2020, 4:32 PM

## 2020-03-13 NOTE — ED Notes (Signed)
Pt back from MRI. Was not able to do Q2 neuro because pt was away. Pt assessed upon return, no change noted.

## 2020-03-13 NOTE — ED Provider Notes (Signed)
[facility] EMERGENCY DEPARTMENT Provider Note   CSN: [id number] Arrival date & time: 03/13/20  1244  An emergency department physician performed an initial assessment on this suspected stroke patient at 1300.  History No chief complaint on file.   Theodore Wood is a 69 y.o. male.  HPI 69 year old male presents with vision changes, dizziness and trouble walking.  Started when he first woke up this morning at around 7 AM.  Last went to bed last night normal.  Feels like he is having trouble seeing out of the right eye and has double vision.  Having a hard time walking.  Some speech changes transiently but this is better.  Was slurred speech. Thought it might be related to him taking his insulin.  Called a code stroke in triage as he was LVO+ on screening.   Past Medical History:  Diagnosis Date  . Angina   . Diabetes mellitus without complication ([place])   . GERD (gastroesophageal reflux disease)   . High cholesterol   . Hypertension   . Prostate cancer [facility])     Patient Active Problem List   Diagnosis Date Noted  . CVA (cerebral vascular accident) ([place]) 03/13/2020  . GERD (gastroesophageal reflux disease)   . Diabetic ketoacidosis without coma associated with type 2 diabetes mellitus ([place])   . DM (diabetes mellitus) ([place])   . Severe hyperglycemia due to diabetes mellitus ([place]) 02/13/2020  . Hypertriglyceridemia 02/13/2020  . Malignant neoplasm of prostate ([place]) 12/12/2018  . Abnormal cardiac function test 01/14/2016  . Abnormal EKG   . Hyperlipidemia 01/11/2016  . Tobacco abuse 01/11/2016  . Epigastric pain 01/11/2016  . Gynecomastia, male 01/11/2016  . AKI (acute kidney injury) ([place]) 01/11/2016  . Atypical chest pain 01/11/2016  . Statin myopathy 02/04/2012  . Chest pain 02/03/2012  . Rhabdomyolysis 02/03/2012  . HTN (hypertension) 02/03/2012  . Alcohol abuse 02/03/2012  . Cocaine abuse ([place]) 02/03/2012    Past Surgical History:  Procedure  Laterality Date  . CARDIAC CATHETERIZATION  1990's  . PROSTATE BIOPSY         Family History  Problem Relation Age of Onset  . Stroke Mother   . Stroke Father   . Stroke Brother   . Cancer Maternal Aunt        unknown type    Social History   Tobacco Use  . Smoking status: Former Smoker    Packs/day: 0.25    Years: 30.00    Pack years: 7.50    Types: Cigarettes  . Smokeless tobacco: Never Used  Substance Use Topics  . Alcohol use: Yes    Alcohol/week: 42.0 standard drinks    Types: 42 Cans of beer per week    Comment: 4 X  a week "a couple of beers"  . Drug use: Yes    Types: Cocaine, Marijuana, Heroin    Comment: 02/03/12 "3 days ago I did cocaine to get the pain off; used heroin years ago" 08/28/2014 " I stopped using all that"    Home Medications Prior to Admission medications   Medication Sig Start Date End Date Taking? Authorizing Provider  blood glucose meter kit and supplies KIT Dispense based on patient and insurance preference. Use up to four times daily as directed. (FOR ICD-9 250.00, 250.01). Patient taking differently: 1 each by Other route See admin instructions. Dispense based on patient and insurance preference. Use up to four times daily as directed. (FOR ICD-9 250.00, 250.01). 02/14/20  Yes Coe,  Marland Kitchen, MD  fenofibrate 54 MG tablet Take 1 tablet (54 mg total) by mouth daily. 02/15/20 03/16/20 Yes Marianna Payment, MD  insulin aspart (NOVOLOG) 100 UNIT/ML injection Inject 12 Units into the skin 3 (three) times daily before meals. 02/15/20 03/16/20 Yes Marianna Payment, MD  insulin detemir (LEVEMIR) 100 UNIT/ML FlexPen Inject 30 Units into the skin at bedtime. 02/15/20  Yes Marianna Payment, MD  lisinopril (ZESTRIL) 10 MG tablet Take 1 tablet (10 mg total) by mouth daily. 02/16/20 03/17/20 Yes Marianna Payment, MD  metFORMIN (GLUCOPHAGE) 500 MG tablet Take 500 mg by mouth daily before breakfast.   Yes [provider]  nitroGLYCERIN (NITROSTAT) 0.3 MG SL tablet Place 1 tablet  (0.3 mg total) under the tongue every 5 (five) minutes as needed for chest pain. 01/13/16  Yes [name], MD  blood glucose meter kit and supplies KIT Dispense based on patient and insurance preference. Use up to four times daily as directed. (FOR ICD-9 250.00, 250.01). Patient not taking: Reported on 03/13/2020 02/15/20   Marianna Payment, MD  diclofenac sodium (VOLTAREN) 1 % GEL Apply 2 g topically 4 (four) times daily. Patient not taking: Reported on 12/30/2018 01/13/16   [name], MD  folic acid (FOLVITE) 1 MG tablet Take 1 tablet (1 mg total) by mouth daily. Patient not taking: Reported on 12/30/2018 01/13/16   [name], MD  Multiple Vitamin (MULTIVITAMIN WITH MINERALS) TABS tablet Take 1 tablet by mouth daily. Patient not taking: Reported on 12/30/2018 01/13/16   [name], MD  oxyCODONE-acetaminophen (PERCOCET/ROXICET) 5-325 MG tablet Take 1 tablet by mouth every 6 (six) hours as needed for severe pain. Patient not taking: Reported on 12/30/2018 11/25/17   Hedges, Dellis Filbert, PA-C  pantoprazole (PROTONIX) 40 MG tablet Take 1 tablet (40 mg total) by mouth daily. Patient not taking: Reported on 12/30/2018 01/13/16   [name], MD  rosuvastatin (CRESTOR) 40 MG tablet Take 1 tablet (40 mg total) by mouth daily at 6 PM. Patient not taking: Reported on 12/30/2018 01/13/16   [name], MD    Allergies    Patient has no known allergies.  Review of Systems   Review of Systems  Eyes: Positive for visual disturbance.  Musculoskeletal: Positive for gait problem.  Neurological: Positive for dizziness. Negative for weakness, numbness and headaches.  All other systems reviewed and are negative.   Physical Exam Updated Vital Signs BP (!) 153/87   Pulse 83   Temp 98.4 F (36.9 C)   Resp (!) 22   Ht 5' 6" (1.676 m)   SpO2 96%   BMI 26.95 kg/m   Physical Exam Vitals and nursing note reviewed.  Constitutional:      Appearance: He is well-developed.  HENT:     Head: Normocephalic and  atraumatic.     Right Ear: External ear normal.     Left Ear: External ear normal.     Nose: Nose normal.  Eyes:     General:        Right eye: No discharge.        Left eye: No discharge.     Pupils: Pupils are equal, round, and reactive to light.     Comments: Double vision with right eye and certain movements. Even with left eye closed has double vision  Cardiovascular:     Rate and Rhythm: Normal rate and regular rhythm.     Heart sounds: Normal heart sounds.  Pulmonary:     Effort: Pulmonary effort is normal.  Breath sounds: Normal breath sounds.  Abdominal:     Palpations: Abdomen is soft.     Tenderness: There is no abdominal tenderness.  Musculoskeletal:     Cervical back: Neck supple.  Skin:    General: Skin is warm and dry.  Neurological:     Mental Status: He is alert.     Comments: CN 3-12 grossly intact. 5/5 strength in all 4 extremities. Grossly normal sensation. Normal finger to nose in left hand, abnormal in right  Psychiatric:        Mood and Affect: Mood is not anxious.     ED Results / Procedures / Treatments   Labs (all labs ordered are listed, but only abnormal results are displayed) Labs Reviewed  DIFFERENTIAL - Abnormal; Notable for the following components:      Result Value   Monocytes Absolute 1.1 (*)    All other components within normal limits  SARS CORONAVIRUS 2 (TAT 6-24 HRS)  ETHANOL  PROTIME-INR  APTT  CBC  COMPREHENSIVE METABOLIC PANEL  RAPID URINE DRUG SCREEN, HOSP PERFORMED  URINALYSIS, ROUTINE W REFLEX MICROSCOPIC  I-STAT CHEM 8, ED  CBG MONITORING, ED    EKG EKG Interpretation  Date/Time:  Wednesday March 13 2020 13:46:35 EDT Ventricular Rate:  81 PR Interval:    QRS Duration: 77 QT Interval:  369 QTC Calculation: 429 R Axis:   49 Text Interpretation: Sinus rhythm Minimal ST depression, inferior leads similar to Feb 14 2020 Confirmed by Sherwood Gambler [phone]) on 03/13/2020 2:00:00 PM   Radiology CT ANGIO HEAD W OR  WO CONTRAST  Result Date: 03/13/2020 CLINICAL DATA:  Stroke. Vomiting and dizziness. Right-sided weakness. Blurred vision EXAM: CT ANGIOGRAPHY HEAD AND NECK CT PERFUSION BRAIN TECHNIQUE: Multidetector CT imaging of the head and neck was performed using the standard protocol during bolus administration of intravenous contrast. Multiplanar CT image reconstructions and MIPs were obtained to evaluate the vascular anatomy. Carotid stenosis measurements (when applicable) are obtained utilizing NASCET criteria, using the distal internal carotid diameter as the denominator. Multiphase CT imaging of the brain was performed following IV bolus contrast injection. Subsequent parametric perfusion maps were calculated using RAPID software. CONTRAST:  174m OMNIPAQUE IOHEXOL 350 MG/ML SOLN COMPARISON:  CT head earlier today FINDINGS: CTA NECK FINDINGS Aortic arch: Atherosclerotic aortic arch. Proximal great vessels widely patent. Three-vessel arch. Right carotid system: Right carotid bifurcation widely patent without significant stenosis. Left carotid system: Minimal atherosclerotic calcification left carotid bifurcation without significant stenosis. Vertebral arteries: Both vertebral arteries widely patent without stenosis. Skeleton: Negative Other neck: Negative for mass or adenopathy Upper chest: Lung apices clear bilaterally. Review of the MIP images confirms the above findings CTA HEAD FINDINGS Anterior circulation: Moderate stenosis of the supraclinoid internal carotid artery bilaterally due to atherosclerotic disease. Anterior and middle cerebral arteries patent without large vessel occlusion or significant stenosis. Posterior circulation: Both vertebral arteries patent to the basilar. PICA patent bilaterally. Basilar widely patent. Superior cerebellar and posterior cerebral arteries patent bilaterally without stenosis. Venous sinuses: Normal venous enhancement. Anatomic variants: None Review of the MIP images confirms  the above findings CT Brain Perfusion Findings: ASPECTS: 9 CBF (<30%) Volume: 0  mL Perfusion (Tmax>6.0s) volume: 0134mMismatch Volume: 34m86mnfarction Location:none IMPRESSION: 1. Negative for emergent large vessel occlusion. 2. Moderate stenosis supraclinoid internal carotid artery bilaterally due to atherosclerotic disease. 3. No significant carotid or vertebral artery stenosis in the neck. 4. CT perfusion negative for infarct or delayed perfusion 5. These results were called by telephone at  the time of interpretation on 03/13/2020 at 1:44 pm to provider Lindzen , who verbally acknowledged these results. Electronically Signed   By: Franchot Gallo M.D.   On: 03/13/2020 13:45   CT ANGIO NECK W OR WO CONTRAST  Result Date: 03/13/2020 CLINICAL DATA:  Stroke. Vomiting and dizziness. Right-sided weakness. Blurred vision EXAM: CT ANGIOGRAPHY HEAD AND NECK CT PERFUSION BRAIN TECHNIQUE: Multidetector CT imaging of the head and neck was performed using the standard protocol during bolus administration of intravenous contrast. Multiplanar CT image reconstructions and MIPs were obtained to evaluate the vascular anatomy. Carotid stenosis measurements (when applicable) are obtained utilizing NASCET criteria, using the distal internal carotid diameter as the denominator. Multiphase CT imaging of the brain was performed following IV bolus contrast injection. Subsequent parametric perfusion maps were calculated using RAPID software. CONTRAST:  172m OMNIPAQUE IOHEXOL 350 MG/ML SOLN COMPARISON:  CT head earlier today FINDINGS: CTA NECK FINDINGS Aortic arch: Atherosclerotic aortic arch. Proximal great vessels widely patent. Three-vessel arch. Right carotid system: Right carotid bifurcation widely patent without significant stenosis. Left carotid system: Minimal atherosclerotic calcification left carotid bifurcation without significant stenosis. Vertebral arteries: Both vertebral arteries widely patent without stenosis. Skeleton:  Negative Other neck: Negative for mass or adenopathy Upper chest: Lung apices clear bilaterally. Review of the MIP images confirms the above findings CTA HEAD FINDINGS Anterior circulation: Moderate stenosis of the supraclinoid internal carotid artery bilaterally due to atherosclerotic disease. Anterior and middle cerebral arteries patent without large vessel occlusion or significant stenosis. Posterior circulation: Both vertebral arteries patent to the basilar. PICA patent bilaterally. Basilar widely patent. Superior cerebellar and posterior cerebral arteries patent bilaterally without stenosis. Venous sinuses: Normal venous enhancement. Anatomic variants: None Review of the MIP images confirms the above findings CT Brain Perfusion Findings: ASPECTS: 9 CBF (<30%) Volume: 0  mL Perfusion (Tmax>6.0s) volume: 037mMismatch Volume: 31m69mnfarction Location:none IMPRESSION: 1. Negative for emergent large vessel occlusion. 2. Moderate stenosis supraclinoid internal carotid artery bilaterally due to atherosclerotic disease. 3. No significant carotid or vertebral artery stenosis in the neck. 4. CT perfusion negative for infarct or delayed perfusion 5. These results were called by telephone at the time of interpretation on 03/13/2020 at 1:44 pm to provider Lindzen , who verbally acknowledged these results. Electronically Signed   By: ChaFranchot GalloD.   On: 03/13/2020 13:45   CT CEREBRAL PERFUSION W CONTRAST  Result Date: 03/13/2020 CLINICAL DATA:  Stroke. Vomiting and dizziness. Right-sided weakness. Blurred vision EXAM: CT ANGIOGRAPHY HEAD AND NECK CT PERFUSION BRAIN TECHNIQUE: Multidetector CT imaging of the head and neck was performed using the standard protocol during bolus administration of intravenous contrast. Multiplanar CT image reconstructions and MIPs were obtained to evaluate the vascular anatomy. Carotid stenosis measurements (when applicable) are obtained utilizing NASCET criteria, using the distal  internal carotid diameter as the denominator. Multiphase CT imaging of the brain was performed following IV bolus contrast injection. Subsequent parametric perfusion maps were calculated using RAPID software. CONTRAST:  1031m3mNIPAQUE IOHEXOL 350 MG/ML SOLN COMPARISON:  CT head earlier today FINDINGS: CTA NECK FINDINGS Aortic arch: Atherosclerotic aortic arch. Proximal great vessels widely patent. Three-vessel arch. Right carotid system: Right carotid bifurcation widely patent without significant stenosis. Left carotid system: Minimal atherosclerotic calcification left carotid bifurcation without significant stenosis. Vertebral arteries: Both vertebral arteries widely patent without stenosis. Skeleton: Negative Other neck: Negative for mass or adenopathy Upper chest: Lung apices clear bilaterally. Review of the MIP images confirms the above findings CTA HEAD FINDINGS Anterior circulation: Moderate  stenosis of the supraclinoid internal carotid artery bilaterally due to atherosclerotic disease. Anterior and middle cerebral arteries patent without large vessel occlusion or significant stenosis. Posterior circulation: Both vertebral arteries patent to the basilar. PICA patent bilaterally. Basilar widely patent. Superior cerebellar and posterior cerebral arteries patent bilaterally without stenosis. Venous sinuses: Normal venous enhancement. Anatomic variants: None Review of the MIP images confirms the above findings CT Brain Perfusion Findings: ASPECTS: 9 CBF (<30%) Volume: 0  mL Perfusion (Tmax>6.0s) volume: 42m Mismatch Volume: 033mInfarction Location:none IMPRESSION: 1. Negative for emergent large vessel occlusion. 2. Moderate stenosis supraclinoid internal carotid artery bilaterally due to atherosclerotic disease. 3. No significant carotid or vertebral artery stenosis in the neck. 4. CT perfusion negative for infarct or delayed perfusion 5. These results were called by telephone at the time of interpretation on  03/13/2020 at 1:44 pm to provider Lindzen , who verbally acknowledged these results. Electronically Signed   By: ChFranchot Gallo.D.   On: 03/13/2020 13:45   CT HEAD CODE STROKE WO CONTRAST  Result Date: 03/13/2020 CLINICAL DATA:  Code stroke. Vomiting dizziness. Right-sided weakness and blurred vision. EXAM: CT HEAD WITHOUT CONTRAST TECHNIQUE: Contiguous axial images were obtained from the base of the skull through the vertex without intravenous contrast. COMPARISON:  None. FINDINGS: Brain: Mild atrophy. Low-density in the right frontal lobe above the orbit. Possible infarct of indeterminate age. Possible vasogenic edema related to mass lesion. Patchy white matter hypodensity bilaterally compatible with chronic microvascular ischemia. Negative for acute hemorrhage. Vascular: Negative for hyperdense vessel Skull: Negative Sinuses/Orbits: Mild mucosal edema paranasal sinuses. Negative orbit Other: None ASPECTS ([place]stroke Program Early CT Score) - Ganglionic level infarction (caudate, lentiform nuclei, internal capsule, insula, M1-M3 cortex): 6 - Supraganglionic infarction (M4-M6 cortex): 3 Total score (0-10 with 10 being normal): None IMPRESSION: 1. Hypodensity right inferior frontal lobe of indeterminate etiology. No prior studies. Differential includes acute or chronic infarct as well as mass lesion. Recommend MRI brain without with contrast 2. ASPECTS is 9 due to possible infarct in the right frontal lobe. 3. Results texted to Dr. LiCheral MarkerElectronically Signed   By: ChFranchot Gallo.D.   On: 03/13/2020 13:23    Procedures Procedures (including critical care time)  Medications Ordered in ED Medications  ondansetron (ZOFRAN) 4 MG/2ML injection (has no administration in time range)  aspirin EC tablet 650 mg (has no administration in time range)  iohexol (OMNIPAQUE) 350 MG/ML injection 100 mL (100 mLs Intravenous Contrast Given 03/13/20 1333)  meclizine (ANTIVERT) tablet 25 mg (25 mg Oral Given  03/13/20 1410)    ED Course  I have reviewed the triage vital signs and the nursing notes.  Pertinent labs & imaging results that were available during my care of the patient were reviewed by me and considered in my medical decision making (see chart for details).    MDM Rules/Calculators/A&P                      Patient was called a code stroke. However, no LVO, certainly no indication for TPA. Possibly cerebellar stroke, maybe cardio-embolic. Neuro recommends MRI brain and orbits (will add contrast with CT head findings). Discussed with Dr. PaDoristine Bosworthor admission. Final Clinical Impression(s) / ED Diagnoses Final diagnoses:  Double vision    Rx / DC Orders ED Discharge Orders    None       GoSherwood GamblerMD 03/13/20 1517

## 2020-03-13 NOTE — Consult Note (Signed)
Referring Physician: Dr. Regenia Skeeter    Chief Complaint: Acute onset of gait unsteadiness and right monocular vision loss  HPI: Theodore Wood is an 69 y.o. male presenting to the ED with acute onset of gait unsteadiness and right monocular vision loss, first noticed on awakening this AM. LKN was 2100 when he went to bed. Code Stroke was called in Triage.   In CT, he became nauseated and vomited.   LSN: 2100 tPA Given: No: Out of time window.   Past Medical History:  Diagnosis Date  . Angina   . Diabetes mellitus without complication ([place])   . GERD (gastroesophageal reflux disease)   . High cholesterol   . Hypertension   . Prostate cancer [facility])     Past Surgical History:  Procedure Laterality Date  . CARDIAC CATHETERIZATION  1990's  . PROSTATE BIOPSY      Family History  Problem Relation Age of Onset  . Stroke Mother   . Stroke Father   . Stroke Brother   . Cancer Maternal Aunt        unknown type   Social History:  reports that he has quit smoking. His smoking use included cigarettes. He has a 7.50 pack-year smoking history. He has never used smokeless tobacco. He reports current alcohol use of about 42.0 standard drinks of alcohol per week. He reports current drug use. Drugs: Cocaine, Marijuana, and Heroin.  Allergies: No Known Allergies  Home Medications:  No current facility-administered medications on file prior to encounter.   Current Outpatient Medications on File Prior to Encounter  Medication Sig Dispense Refill  . blood glucose meter kit and supplies KIT Dispense based on patient and insurance preference. Use up to four times daily as directed. (FOR ICD-9 250.00, 250.01). 1 each 0  . blood glucose meter kit and supplies KIT Dispense based on patient and insurance preference. Use up to four times daily as directed. (FOR ICD-9 250.00, 250.01). 1 each 0  . cetirizine (ZYRTEC) 10 MG tablet Take 10 mg by mouth daily.    . diclofenac sodium (VOLTAREN) 1 % GEL Apply 2  g topically 4 (four) times daily. (Patient not taking: Reported on 12/30/2018) 100 g 3  . fenofibrate 54 MG tablet Take 1 tablet (54 mg total) by mouth daily. 30 tablet 0  . folic acid (FOLVITE) 1 MG tablet Take 1 tablet (1 mg total) by mouth daily. (Patient not taking: Reported on 12/30/2018) 90 tablet 3  . gabapentin (NEURONTIN) 300 MG capsule Take 300 mg by mouth 2 (two) times daily.    . hydrochlorothiazide (HYDRODIURIL) 25 MG tablet Take 25 mg by mouth daily.    . insulin aspart (NOVOLOG) 100 UNIT/ML injection Inject 12 Units into the skin 3 (three) times daily before meals. 10.8 mL 0  . insulin detemir (LEVEMIR) 100 UNIT/ML FlexPen Inject 30 Units into the skin at bedtime. 15 mL 11  . lisinopril (ZESTRIL) 10 MG tablet Take 1 tablet (10 mg total) by mouth daily. 30 tablet 0  . Multiple Vitamin (MULTIVITAMIN WITH MINERALS) TABS tablet Take 1 tablet by mouth daily. (Patient not taking: Reported on 12/30/2018) 90 tablet 4  . NIFEdipine (ADALAT CC) 30 MG 24 hr tablet Take 30 mg by mouth daily.    . nitroGLYCERIN (NITROSTAT) 0.3 MG SL tablet Place 1 tablet (0.3 mg total) under the tongue every 5 (five) minutes as needed for chest pain. (Patient not taking: Reported on 12/30/2018) 30 tablet 12  . oxyCODONE-acetaminophen (PERCOCET/ROXICET) 5-325 MG tablet Take  1 tablet by mouth every 6 (six) hours as needed for severe pain. (Patient not taking: Reported on 12/30/2018) 6 tablet 0  . pantoprazole (PROTONIX) 40 MG tablet Take 1 tablet (40 mg total) by mouth daily. (Patient not taking: Reported on 12/30/2018) 90 tablet 0  . rosuvastatin (CRESTOR) 40 MG tablet Take 1 tablet (40 mg total) by mouth daily at 6 PM. (Patient not taking: Reported on 12/30/2018) 90 tablet 4  . sildenafil (VIAGRA) 100 MG tablet Take 100 mg by mouth daily as needed for erectile dysfunction.       ROS: Positive for nausea. Other ROS as per HPI.   Physical Examination: Blood pressure (!) 183/84, pulse 90, temperature 98.4 F (36.9 C),  resp. rate 20, height '5\' 6"'  (1.676 m), SpO2 96 %.  HEENT: [place]/AT Lungs: Respirations unlabored Ext: No edema  Neurologic Examination: Mental Status: Alert, oriented, thought content appropriate.  Speech fluent without evidence of aphasia.  Able to follow all commands without difficulty. Cranial Nerves: II:  PERRL. Visual fields intact all 4 quadrants of left eye. Visual acuity OS is baseline. Visual acuity OD is moderate to severely impaired, with patient able to count fingers and identify objects, but unable to read.  III,IV, VI: No ptosis. EOMI.  V,VII: No facial droop. Decreased temp and FT sensation on the right.  VIII: Hearing intact to voice.  IX,X: No hypophonia XI: Symmetric XII: midline tongue extension  Motor: RUE and RLE 5/5 Subtle LUE and LLE 4+/5 weakness Sensory: Decreased temp sensation RUE Deep Tendon Reflexes:  2+ bilateral brachioradialis and patellae Toes downgoing bilaterally Cerebellar: Moderate past pointing with dyssinergia right FNF, mild dyssinergia left FNF. Mild ataxia left H-S.  Gait: Wide based, stooped and ataxic with unsteadiness requiring assistance.   Results for orders placed or performed during the hospital encounter of 03/13/20 (from the past 48 hour(s))  CBG monitoring, ED     Status: None   Collection Time: 03/13/20 12:53 PM  Result Value Ref Range   Glucose-Capillary 90 70 - 99 mg/dL    Comment: Glucose reference range applies only to samples taken after fasting for at least 8 hours.   Comment 1 Notify RN    Comment 2 Document in Chart   Protime-INR     Status: None   Collection Time: 03/13/20 12:54 PM  Result Value Ref Range   Prothrombin Time 12.7 11.4 - 15.2 seconds   INR 1.0 0.8 - 1.2    Comment: (NOTE) INR goal varies based on device and disease states. Performed at [facility], [place] [address] [place], [place] [address]   APTT     Status: None   Collection Time: 03/13/20 12:54 PM  Result Value Ref Range   aPTT 27 24 -  36 seconds    Comment: Performed at [facility] [address], [place] [address]  CBC     Status: None   Collection Time: 03/13/20 12:54 PM  Result Value Ref Range   WBC 6.2 4.0 - 10.5 K/uL   RBC 5.04 4.22 - 5.81 MIL/uL   Hemoglobin 14.7 13.0 - 17.0 g/dL   HCT 45.1 39.0 - 52.0 %   MCV 89.5 80.0 - 100.0 fL   MCH 29.2 26.0 - 34.0 pg   MCHC 32.6 30.0 - 36.0 g/dL   RDW 14.1 11.5 - 15.5 %   Platelets 369 150 - 400 K/uL   nRBC 0.0 0.0 - 0.2 %    Comment: Performed at [facility], [place] [address] [place], [place] [address]  Differential     Status: Abnormal   Collection Time: 03/13/20 12:54 PM  Result Value Ref Range   Neutrophils Relative % 42 %   Neutro Abs 2.7 1.7 - 7.7 K/uL   Lymphocytes Relative 37 %   Lymphs Abs 2.3 0.7 - 4.0 K/uL   Monocytes Relative 18 %   Monocytes Absolute 1.1 (H) 0.1 - 1.0 K/uL   Eosinophils Relative 1 %   Eosinophils Absolute 0.1 0.0 - 0.5 K/uL   Basophils Relative 1 %   Basophils Absolute 0.0 0.0 - 0.1 K/uL   Immature Granulocytes 1 %   Abs Immature Granulocytes 0.05 0.00 - 0.07 K/uL    Comment: Performed at [facility] [address], [place], [place] [address]  I-stat chem 8, ED     Status: None   Collection Time: 03/13/20  1:00 PM  Result Value Ref Range   Sodium 143 135 - 145 mmol/L   Potassium 3.8 3.5 - 5.1 mmol/L   Chloride 106 98 - 111 mmol/L   BUN 13 8 - 23 mg/dL   Creatinine, Ser 0.80 0.61 - 1.24 mg/dL   Glucose, Bld 95 70 - 99 mg/dL    Comment: Glucose reference range applies only to samples taken after fasting for at least 8 hours.   Calcium, Ion 1.19 1.15 - 1.40 mmol/L   TCO2 27 22 - 32 mmol/L   Hemoglobin 15.0 13.0 - 17.0 g/dL   HCT 44.0 39.0 - 52.0 %   CT HEAD CODE STROKE WO CONTRAST  Result Date: 03/13/2020 CLINICAL DATA:  Code stroke. Vomiting dizziness. Right-sided weakness and blurred vision. EXAM: CT HEAD WITHOUT CONTRAST TECHNIQUE: Contiguous axial images were obtained from the base of the  skull through the vertex without intravenous contrast. COMPARISON:  None. FINDINGS: Brain: Mild atrophy. Low-density in the right frontal lobe above the orbit. Possible infarct of indeterminate age. Possible vasogenic edema related to mass lesion. Patchy white matter hypodensity bilaterally compatible with chronic microvascular ischemia. Negative for acute hemorrhage. Vascular: Negative for hyperdense vessel Skull: Negative Sinuses/Orbits: Mild mucosal edema paranasal sinuses. Negative orbit Other: None ASPECTS ([place] Stroke Program Early CT Score) - Ganglionic level infarction (caudate, lentiform nuclei, internal capsule, insula, M1-M3 cortex): 6 - Supraganglionic infarction (M4-M6 cortex): 3 Total score (0-10 with 10 being normal): None IMPRESSION: 1. Hypodensity right inferior frontal lobe of indeterminate etiology. No prior studies. Differential includes acute or chronic infarct as well as mass lesion. Recommend MRI brain without with contrast 2. ASPECTS is 9 due to possible infarct in the right frontal lobe. 3. Results texted to Dr. Cheral Marker. Electronically Signed   By: Franchot Gallo M.D.   On: 03/13/2020 13:23    Assessment: 69 y.o. male presenting with acute onset of right monocular vision loss and gait unsteadiness.  1. Exam shows right monocular severe visual acuity deficit, right hemisensory deficit and moderate gait unsteadiness as well as RUE > LUE = LLE ataxia.  2. Overall clinical presentation suggestive of possible posterior fossa stroke in conjunction with right retinal ischemic infarction. Suspect cardioembolic etiology.  3. Not a tPA candidate due to time criteria 4. Stroke Risk Factors - DM, hypercholesterolemia and HTN 5. CT head shows hypodensity right inferior frontal lobe of indeterminate etiology. Differential includes acute or chronic infarct as well as mass lesion. 6. CTA of head and neck shows no LVO. There is moderate stenosis supraclinoid internal carotid artery bilaterally  due to atherosclerotic  disease. There is no significant carotid or vertebral artery stenosis in the neck. 7. CT perfusion negative for infarct or delayed perfusion  Plan: 1. HgbA1c, fasting lipid panel 2. MRI of the brain without contrast. MRI orbits without contrast.  3. PT consult, OT consult, Speech consult 4. Echocardiogram 5. Frequent neuro checks 6. Prophylactic therapy- ASA 81 mg po qd after 650 mg po load in the ED 7. Risk factor modification 8. Telemetry monitoring 9. Permissive HTN 10. Continue rosuvastatin   '@Electronically'  signed: Dr. Kerney Elbe 03/13/2020, 1:27 PM

## 2020-03-13 NOTE — Code Documentation (Signed)
Stroke Response Nurse Documentation Code Documentation  Theodore Wood is a 69 y.o. male arriving to [facility] ED via Private Vehicle on 03/13/20 with past medical hx of HTN, High Cholesterol and DM. Code stroke was activated by ED. Patient from home where he was LKW last night at 2100 and woke up this morning complaining of dizziness, blurred vision, and difficulty walking. Stroke team met patient in CT.  Patient cleared for CT by Dr. Colin Broach. NIHSS 3, see documentation for details and code stroke times. Patient with right limb ataxia and right decreased sensation and gait instability on exam. The following imaging was completed: CT, CTA, and CTP. Patient is not a candidate for tPA due to out of window. Care/Plan- q2 mNIHSS/VS. Bedside handoff with ED RN Abbe Amsterdam.    Trish Mage  Stroke Response RN

## 2020-03-14 ENCOUNTER — Observation Stay (HOSPITAL_COMMUNITY): Payer: No Typology Code available for payment source

## 2020-03-14 DIAGNOSIS — H532 Diplopia: Secondary | ICD-10-CM | POA: Diagnosis present

## 2020-03-14 DIAGNOSIS — Z823 Family history of stroke: Secondary | ICD-10-CM | POA: Diagnosis not present

## 2020-03-14 DIAGNOSIS — E785 Hyperlipidemia, unspecified: Secondary | ICD-10-CM | POA: Diagnosis present

## 2020-03-14 DIAGNOSIS — R4781 Slurred speech: Secondary | ICD-10-CM | POA: Diagnosis present

## 2020-03-14 DIAGNOSIS — R29703 NIHSS score 3: Secondary | ICD-10-CM | POA: Diagnosis present

## 2020-03-14 DIAGNOSIS — I639 Cerebral infarction, unspecified: Secondary | ICD-10-CM | POA: Diagnosis present

## 2020-03-14 DIAGNOSIS — K219 Gastro-esophageal reflux disease without esophagitis: Secondary | ICD-10-CM

## 2020-03-14 DIAGNOSIS — H53131 Sudden visual loss, right eye: Secondary | ICD-10-CM | POA: Diagnosis not present

## 2020-03-14 DIAGNOSIS — Z8546 Personal history of malignant neoplasm of prostate: Secondary | ICD-10-CM | POA: Diagnosis not present

## 2020-03-14 DIAGNOSIS — I10 Essential (primary) hypertension: Secondary | ICD-10-CM

## 2020-03-14 DIAGNOSIS — Z20822 Contact with and (suspected) exposure to covid-19: Secondary | ICD-10-CM | POA: Diagnosis present

## 2020-03-14 DIAGNOSIS — Z720 Tobacco use: Secondary | ICD-10-CM

## 2020-03-14 DIAGNOSIS — E7800 Pure hypercholesterolemia, unspecified: Secondary | ICD-10-CM | POA: Diagnosis present

## 2020-03-14 DIAGNOSIS — F17210 Nicotine dependence, cigarettes, uncomplicated: Secondary | ICD-10-CM | POA: Diagnosis present

## 2020-03-14 DIAGNOSIS — E1151 Type 2 diabetes mellitus with diabetic peripheral angiopathy without gangrene: Secondary | ICD-10-CM | POA: Diagnosis present

## 2020-03-14 DIAGNOSIS — I6389 Other cerebral infarction: Secondary | ICD-10-CM | POA: Diagnosis not present

## 2020-03-14 DIAGNOSIS — Z794 Long term (current) use of insulin: Secondary | ICD-10-CM | POA: Diagnosis not present

## 2020-03-14 DIAGNOSIS — E11319 Type 2 diabetes mellitus with unspecified diabetic retinopathy without macular edema: Secondary | ICD-10-CM | POA: Diagnosis present

## 2020-03-14 DIAGNOSIS — Z809 Family history of malignant neoplasm, unspecified: Secondary | ICD-10-CM | POA: Diagnosis not present

## 2020-03-14 LAB — CBC WITH DIFFERENTIAL/PLATELET
Abs Immature Granulocytes: 0 10*3/uL (ref 0.00–0.07)
Basophils Absolute: 0.1 10*3/uL (ref 0.0–0.1)
Basophils Relative: 1 %
Eosinophils Absolute: 0.1 10*3/uL (ref 0.0–0.5)
Eosinophils Relative: 1 %
HCT: 40.9 % (ref 39.0–52.0)
Hemoglobin: 13.3 g/dL (ref 13.0–17.0)
Lymphocytes Relative: 19 %
Lymphs Abs: 1.1 10*3/uL (ref 0.7–4.0)
MCH: 29 pg (ref 26.0–34.0)
MCHC: 32.5 g/dL (ref 30.0–36.0)
MCV: 89.3 fL (ref 80.0–100.0)
Monocytes Absolute: 0.8 10*3/uL (ref 0.1–1.0)
Monocytes Relative: 13 %
Neutro Abs: 3.9 10*3/uL (ref 1.7–7.7)
Neutrophils Relative %: 66 %
Platelets: 330 10*3/uL (ref 150–400)
RBC: 4.58 MIL/uL (ref 4.22–5.81)
RDW: 14.2 % (ref 11.5–15.5)
WBC: 5.9 10*3/uL (ref 4.0–10.5)
nRBC: 0 % (ref 0.0–0.2)
nRBC: 0 /100 WBC

## 2020-03-14 LAB — ECHOCARDIOGRAM COMPLETE: Height: 66 in

## 2020-03-14 LAB — BASIC METABOLIC PANEL
Anion gap: 10 (ref 5–15)
BUN: 11 mg/dL (ref 8–23)
CO2: 24 mmol/L (ref 22–32)
Calcium: 9.3 mg/dL (ref 8.9–10.3)
Chloride: 106 mmol/L (ref 98–111)
Creatinine, Ser: 0.94 mg/dL (ref 0.61–1.24)
GFR calc Af Amer: >60 mL/min (ref >60)
GFR calc non Af Amer: >60 mL/min (ref >60)
Glucose, Bld: 147 mg/dL — ABNORMAL HIGH (ref 70–99)
Potassium: 3.7 mmol/L (ref 3.5–5.1)
Sodium: 140 mmol/L (ref 135–145)

## 2020-03-14 LAB — URINALYSIS, ROUTINE W REFLEX MICROSCOPIC
Bilirubin Urine: NEGATIVE
Glucose, UA: NEGATIVE mg/dL
Hgb urine dipstick: NEGATIVE
Ketones, ur: NEGATIVE mg/dL
Leukocytes,Ua: NEGATIVE
Nitrite: NEGATIVE
Protein, ur: NEGATIVE mg/dL
Specific Gravity, Urine: 1.024 (ref 1.005–1.030)
pH: 5 (ref 5.0–8.0)

## 2020-03-14 LAB — HEMOGLOBIN A1C
Hgb A1c MFr Bld: 9.7 % — ABNORMAL HIGH (ref 4.8–5.6)
Mean Plasma Glucose: 231.69 mg/dL

## 2020-03-14 LAB — LIPID PANEL
Cholesterol: 191 mg/dL (ref 0–200)
HDL: 25 mg/dL — ABNORMAL LOW (ref >40)
LDL Cholesterol: 110 mg/dL — ABNORMAL HIGH (ref 0–99)
Total CHOL/HDL Ratio: 7.6 RATIO
Triglycerides: 281 mg/dL — ABNORMAL HIGH (ref <150)
VLDL: 56 mg/dL — ABNORMAL HIGH (ref 0–40)

## 2020-03-14 LAB — GLUCOSE, CAPILLARY
Glucose-Capillary: 104 mg/dL — ABNORMAL HIGH (ref 70–99)
Glucose-Capillary: 131 mg/dL — ABNORMAL HIGH (ref 70–99)
Glucose-Capillary: 124 mg/dL — ABNORMAL HIGH (ref 70–99)
Glucose-Capillary: 150 mg/dL — ABNORMAL HIGH (ref 70–99)

## 2020-03-14 LAB — RAPID URINE DRUG SCREEN, HOSP PERFORMED
Amphetamines: NOT DETECTED
Barbiturates: NOT DETECTED
Benzodiazepines: NOT DETECTED
Cocaine: NOT DETECTED
Opiates: NOT DETECTED
Tetrahydrocannabinol: NOT DETECTED

## 2020-03-14 MED ORDER — CLOPIDOGREL BISULFATE 75 MG PO TABS
75.0000 mg | ORAL_TABLET | Freq: Every day | ORAL | Status: DC
  Administered 2020-03-14 – 2020-03-15 (×2): 75 mg via ORAL
  Filled 2020-03-14 (×2): qty 1

## 2020-03-14 NOTE — Progress Notes (Signed)
Inpatient Diabetes Program Recommendations  AACE/ADA: New Consensus Statement on Inpatient Glycemic Control   Target Ranges:  Prepandial:   less than 140 mg/dL      Peak postprandial:   less than 180 mg/dL (1-2 hours)      Critically ill patients:  140 - 180 mg/dL   Results for Theodore Wood, Theodore Wood (MRN [id number]) as of 03/14/2020 14:19  Ref. Range 03/13/2020 12:53 03/13/2020 18:06 03/13/2020 22:42 03/14/2020 06:00 03/14/2020 11:47  Glucose-Capillary Latest Ref Range: 70 - 99 mg/dL 90 64 (L) 104 (H) 104 (H) 131 (H)  Results for Theodore Wood, Theodore Wood (MRN [id number]) as of 03/14/2020 14:19  Ref. Range 01/11/2016 17:20 02/13/2020 19:10 03/14/2020 04:05  Hemoglobin A1C Latest Ref Range: 4.8 - 5.6 % 5.5 14.1 (H) 9.7 (H)   Review of Glycemic Control  Diabetes history: DM2 (dx 02/13/20 during last hospital admission) Outpatient Diabetes medications: Levemir 30 units QHS, Novolog 12 units TID with meals, Metformin 500 mg QAM Current orders for Inpatient glycemic control: Novlog 0-15 units TID with meals, Novolog 0-5 units QHS  Inpatient Diabetes Program Recommendations:   HgbA1C: Current A1C 9.7% on 03/14/20 (was 14.1% on 02/13/20) which indicates an average glucose of 232 mg/dl. Patient was newly dx with DM during last hospital admisson 02/13/20-02/15/20 and discharged on Levemir, Novolog, and Metformin.  Noted glucose of 64 mg/dl at 18:06 on 03/13/20; may need to decrease outpatient insulin dosages if patient is experiencing any hypoglycemia.  NOTE: In reviewing chart, noted patient was admitted 02/13/20-02/15/20 with hyperglycemia and was newly dx with DM at that time. Patient was discharged on 02/15/20 on Levemir 30 units QHS, Novolog 12 units TID with meals.  Noted glucose of 64 mg/dl on 03/13/20 and fasting glucose 104 mg/dl today. No basal insulin given since admitted. Will continue to follow.  Thanks, Barnie Alderman, RN, MSN, CDE Diabetes Coordinator Inpatient Diabetes Program [phone] (Team Pager from 8am to 5pm)

## 2020-03-14 NOTE — Plan of Care (Signed)
  Problem: Education: Goal: Knowledge of General Education information will improve Description: Including pain rating scale, medication(s)/side effects and non-pharmacologic comfort measures Outcome: Progressing   Problem: Health Behavior/Discharge Planning: Goal: Ability to manage health-related needs will improve Outcome: Progressing   Problem: Clinical Measurements: Goal: Ability to maintain clinical measurements within normal limits will improve Outcome: Progressing Goal: Will remain free from infection Outcome: Progressing Goal: Diagnostic test results will improve Outcome: Progressing Goal: Respiratory complications will improve Outcome: Progressing Goal: Cardiovascular complication will be avoided Outcome: Progressing   Problem: Activity: Goal: Risk for activity intolerance will decrease Outcome: Progressing   Problem: Nutrition: Goal: Adequate nutrition will be maintained Outcome: Progressing   Problem: Coping: Goal: Level of anxiety will decrease Outcome: Progressing   Problem: Elimination: Goal: Will not experience complications related to bowel motility Outcome: Progressing Goal: Will not experience complications related to urinary retention Outcome: Progressing   Problem: Pain Managment: Goal: General experience of comfort will improve Outcome: Progressing   Problem: Skin Integrity: Goal: Risk for impaired skin integrity will decrease Outcome: Progressing   Problem: Education: Goal: Knowledge of disease or condition will improve Outcome: Progressing Goal: Knowledge of secondary prevention will improve Outcome: Progressing Goal: Knowledge of patient specific risk factors addressed and post discharge goals established will improve Outcome: Progressing   Problem: Coping: Goal: Will verbalize positive feelings about self Outcome: Progressing   Problem: Health Behavior/Discharge Planning: Goal: Ability to manage health-related needs will  improve Outcome: Progressing   Problem: Self-Care: Goal: Ability to participate in self-care as condition permits will improve Outcome: Progressing   Problem: Nutrition: Goal: Risk of aspiration will decrease Outcome: Progressing Goal: Dietary intake will improve Outcome: Progressing   Problem: Ischemic Stroke/TIA Tissue Perfusion: Goal: Complications of ischemic stroke/TIA will be minimized Outcome: Progressing

## 2020-03-14 NOTE — Progress Notes (Addendum)
Progress Note    Theodore Wood  [id number] DOB: 12-27-1950  DOA: 03/13/2020 PCP: Patient, No Pcp Per    Brief Narrative:   Chief complaint: Sudden right eye vision loss, dizziness, gait disturbance  Medical records reviewed and are as summarized below:  Theodore Wood is an 69 y.o. male diabetes, hypertension, hyperlipidemia, GERD presented to the emergency department March 31 chief complaint sudden loss of vision in his right.  Associated symptoms included dizziness and gait unsteadiness.  Code stroke was called.  Neurology was consulted tPA not given as he was outside the window.  [facility] hospitalist admitted  Assessment/Plan:   Principal Problem:   Vision, loss, sudden Active Problems:   DM (diabetes mellitus) ([place])   CVA (cerebral vascular accident) ([place])   HTN (hypertension)   Hyperlipidemia   GERD (gastroesophageal reflux disease)   Tobacco abuse   #1.  Sudden vision loss/CVA/TIA.  Symptoms continued this morning.  CT of the head shows hypodensity right inferior frontal lobe indeterminate etiology, CTA of head and neck shows no LVO but moderate stenosis internal carotid artery bilaterally due to atherosclerotic disease no significant carotid or vertebral artery stenosis in the neck, CT perfusion negative for infarct or delayed perfusion.  MRI no acute intracranial finding, no recent stroke, moderate chronic small vessel ischemic changes of the pons and cerebral hemispheric white matter, no orbital finding.  Echo with an EF of 60% no wall motion abnormality.  He is provided with aspirin and a statin.  Hemoglobin A1c 9.2, lipid panel with triglycerides 281, HDL 25, LDL 110.  Urine drug screen negative.  Evaluated by neurology who opine clinical presentation suggestive of possible posterior fossa stroke in conjunction with right retinal ischemic infarction and suspect cardioembolic etiology.  Not a TPA candidate due to time criteria.  Commending full stroke work-up -PT and  OT evaluation -Continue aspirin and statin -Continue meclizine -will likely need OP ophthalmology consult -Stroke team recommendations  2.  Diabetes.  Patient reports he was started on insulin about 2 weeks ago.  Home medications include Levemir 30 units and NovoLog 12 units 3 times daily with meals as well as Metformin.  Hemoglobin A1c is greater than 9. -Hold Levemir for now -Sliding scale insulin -Hold oral agents -Monitor  #3.  Hypertension.  Fair control.  Home medications include lisinopril -Continue to hold lisinopril -Monitor -Resume lisinopril as indicated  4.  Hyperlipidemia.  Lipid panel as noted above.  Home medications include statin and fenofibrate -Continue statin  #5.  GERD.  Stable at baseline. -Continue PPI  #6.  Tobacco abuse.. Cessation counseling offered  Family Communication/Anticipated D/C date and plan/Code Status   DVT prophylaxis: Lovenox ordered. Code Status: Full Code.  Family Communication: patient at bedside Disposition Plan: to be determined. May be a good CIR candidate. Await PT recs   Medical Consultants:    Stroke team   Anti-Infectives:    None  Subjective:   Awake alert denies pain or discomfort.  Reports still unable to see and complains unsteady gait  Objective:    Vitals:   03/13/20 2125 03/13/20 2301 03/14/20 0401 03/14/20 0829  BP: 126/68 136/73 126/77 (!) 145/99  Pulse: 73 73 73 71  Resp: 15 17 20 20   Temp: 98.1 F (36.7 C) 98.1 F (36.7 C) 98.2 F (36.8 C) 98.3 F (36.8 C)  TempSrc: Oral Oral Oral Oral  SpO2: 100% 100% 100% 100%  Height:        Intake/Output Summary (Last 24 hours)  at 03/14/2020 1022 Last data filed at 03/14/2020 0800 Gross per 24 hour  Intake --  Output 400 ml  Net -400 ml   There were no vitals filed for this visit.  Exam: General: Awake alert oriented x3 CV: Regular rate and rhythm no murmur gallop or rub Respiratory: No increased work of breathing breath sounds are clear  bilaterally I hear no wheeze no rhonchi Abdomen ; soft nondistended nontender to palpation no guarding or rebounding Musculoskeletal: Joints without swelling/erythema Neuro awake alert oriented x3 speech clear facial symmetry bilateral grip 5 out of 5, lower extremity strength 5 out of 5 bilaterally.  Pupils are equal round reactive to light.  Visual field left eye intact. Visual field right eye intact but acuity decreased  Data Reviewed:   I have personally reviewed following labs and imaging studies:  Labs: Labs show the following:   Basic Metabolic Panel: Recent Labs  Lab 03/13/20 1254 03/13/20 1254 03/13/20 1300 03/14/20 0405  NA 141  --  143 140  K 3.7   < > 3.8 3.7  CL 106  --  106 106  CO2 22  --   --  24  GLUCOSE 93  --  95 147*  BUN 11  --  13 11  CREATININE 0.90  --  0.80 0.94  CALCIUM 9.9  --   --  9.3   < > = values in this interval not displayed.   GFR Estimated Creatinine Clearance: 68.8 mL/min (by C-G formula based on SCr of 0.94 mg/dL). Liver Function Tests: Recent Labs  Lab 03/13/20 1254  AST 19  ALT 18  ALKPHOS 68  BILITOT 0.5  PROT 8.0  ALBUMIN 4.2   No results for input(s): LIPASE, AMYLASE in the last 168 hours. No results for input(s): AMMONIA in the last 168 hours. Coagulation profile Recent Labs  Lab 03/13/20 1254  INR 1.0    CBC: Recent Labs  Lab 03/13/20 1254 03/13/20 1300 03/14/20 0405  WBC 6.2  --  5.9  NEUTROABS 2.7  --  3.9  HGB 14.7 15.0 13.3  HCT 45.1 44.0 40.9  MCV 89.5  --  89.3  PLT 369  --  330   Cardiac Enzymes: No results for input(s): CKTOTAL, CKMB, CKMBINDEX, TROPONINI in the last 168 hours. BNP (last 3 results) No results for input(s): PROBNP in the last 8760 hours. CBG: Recent Labs  Lab 03/13/20 1253 03/13/20 1806 03/13/20 2242 03/14/20 0600  GLUCAP 90 64* 104* 104*   D-Dimer: No results for input(s): DDIMER in the last 72 hours. Hgb A1c: Recent Labs    03/14/20 0405  HGBA1C 9.7*   Lipid  Profile: Recent Labs    03/14/20 0405  CHOL 191  HDL 25*  LDLCALC 110*  TRIG 281*  CHOLHDL 7.6   Thyroid function studies: No results for input(s): TSH, T4TOTAL, T3FREE, THYROIDAB in the last 72 hours.  Invalid input(s): FREET3 Anemia work up: No results for input(s): VITAMINB12, FOLATE, FERRITIN, TIBC, IRON, RETICCTPCT in the last 72 hours. Sepsis Labs: Recent Labs  Lab 03/13/20 1254 03/14/20 0405  WBC 6.2 5.9    Microbiology Recent Results (from the past 240 hour(s))  SARS CORONAVIRUS 2 (TAT 6-24 HRS) Nasopharyngeal Nasopharyngeal Swab     Status: None   Collection Time: 03/13/20  2:16 PM   Specimen: Nasopharyngeal Swab  Result Value Ref Range Status   SARS Coronavirus 2 NEGATIVE NEGATIVE Final    Comment: (NOTE) SARS-CoV-2 target nucleic acids are NOT DETECTED. The SARS-CoV-2  RNA is generally detectable in upper and lower respiratory specimens during the acute phase of infection. Negative results do not preclude SARS-CoV-2 infection, do not rule out co-infections with other pathogens, and should not be used as the sole basis for treatment or other patient management decisions. Negative results must be combined with clinical observations, patient history, and epidemiological information. The expected result is Negative. Fact Sheet for Patients: SugarRoll.be Fact Sheet for Healthcare Providers: https://www.woods-mathews.com/ This test is not yet approved or cleared by the [place] FDA and  has been authorized for detection and/or diagnosis of SARS-CoV-2 by FDA under an Emergency Use Authorization (EUA). This EUA will remain  in effect (meaning this test can be used) for the duration of the COVID-19 declaration under Section 56 4(b)(1) of the Act, 21 U.S.C. section 360bbb-3(b)(1), unless the authorization is terminated or revoked sooner. Performed at [facility], [place] [address] [place], [place] [address]      Procedures and diagnostic studies:  CT ANGIO HEAD W OR WO CONTRAST  Result Date: 03/13/2020 CLINICAL DATA:  Stroke. Vomiting and dizziness. Right-sided weakness. Blurred vision EXAM: CT ANGIOGRAPHY HEAD AND NECK CT PERFUSION BRAIN TECHNIQUE: Multidetector CT imaging of the head and neck was performed using the standard protocol during bolus administration of intravenous contrast. Multiplanar CT image reconstructions and MIPs were obtained to evaluate the vascular anatomy. Carotid stenosis measurements (when applicable) are obtained utilizing NASCET criteria, using the distal internal carotid diameter as the denominator. Multiphase CT imaging of the brain was performed following IV bolus contrast injection. Subsequent parametric perfusion maps were calculated using RAPID software. CONTRAST:  16mL OMNIPAQUE IOHEXOL 350 MG/ML SOLN COMPARISON:  CT head earlier today FINDINGS: CTA NECK FINDINGS Aortic arch: Atherosclerotic aortic arch. Proximal great vessels widely patent. Three-vessel arch. Right carotid system: Right carotid bifurcation widely patent without significant stenosis. Left carotid system: Minimal atherosclerotic calcification left carotid bifurcation without significant stenosis. Vertebral arteries: Both vertebral arteries widely patent without stenosis. Skeleton: Negative Other neck: Negative for mass or adenopathy Upper chest: Lung apices clear bilaterally. Review of the MIP images confirms the above findings CTA HEAD FINDINGS Anterior circulation: Moderate stenosis of the supraclinoid internal carotid artery bilaterally due to atherosclerotic disease. Anterior and middle cerebral arteries patent without large vessel occlusion or significant stenosis. Posterior circulation: Both vertebral arteries patent to the basilar. PICA patent bilaterally. Basilar widely patent. Superior cerebellar and posterior cerebral arteries patent bilaterally without stenosis. Venous sinuses: Normal venous  enhancement. Anatomic variants: None Review of the MIP images confirms the above findings CT Brain Perfusion Findings: ASPECTS: 9 CBF (<30%) Volume: 0  mL Perfusion (Tmax>6.0s) volume: 14mL Mismatch Volume: 53mL Infarction Location:none IMPRESSION: 1. Negative for emergent large vessel occlusion. 2. Moderate stenosis supraclinoid internal carotid artery bilaterally due to atherosclerotic disease. 3. No significant carotid or vertebral artery stenosis in the neck. 4. CT perfusion negative for infarct or delayed perfusion 5. These results were called by telephone at the time of interpretation on 03/13/2020 at 1:44 pm to provider Lindzen , who verbally acknowledged these results. Electronically Signed   By: Franchot Gallo M.D.   On: 03/13/2020 13:45   CT ANGIO NECK W OR WO CONTRAST  Result Date: 03/13/2020 CLINICAL DATA:  Stroke. Vomiting and dizziness. Right-sided weakness. Blurred vision EXAM: CT ANGIOGRAPHY HEAD AND NECK CT PERFUSION BRAIN TECHNIQUE: Multidetector CT imaging of the head and neck was performed using the standard protocol during bolus administration of intravenous contrast. Multiplanar CT image reconstructions and MIPs were obtained to  evaluate the vascular anatomy. Carotid stenosis measurements (when applicable) are obtained utilizing NASCET criteria, using the distal internal carotid diameter as the denominator. Multiphase CT imaging of the brain was performed following IV bolus contrast injection. Subsequent parametric perfusion maps were calculated using RAPID software. CONTRAST:  138mL OMNIPAQUE IOHEXOL 350 MG/ML SOLN COMPARISON:  CT head earlier today FINDINGS: CTA NECK FINDINGS Aortic arch: Atherosclerotic aortic arch. Proximal great vessels widely patent. Three-vessel arch. Right carotid system: Right carotid bifurcation widely patent without significant stenosis. Left carotid system: Minimal atherosclerotic calcification left carotid bifurcation without significant stenosis. Vertebral  arteries: Both vertebral arteries widely patent without stenosis. Skeleton: Negative Other neck: Negative for mass or adenopathy Upper chest: Lung apices clear bilaterally. Review of the MIP images confirms the above findings CTA HEAD FINDINGS Anterior circulation: Moderate stenosis of the supraclinoid internal carotid artery bilaterally due to atherosclerotic disease. Anterior and middle cerebral arteries patent without large vessel occlusion or significant stenosis. Posterior circulation: Both vertebral arteries patent to the basilar. PICA patent bilaterally. Basilar widely patent. Superior cerebellar and posterior cerebral arteries patent bilaterally without stenosis. Venous sinuses: Normal venous enhancement. Anatomic variants: None Review of the MIP images confirms the above findings CT Brain Perfusion Findings: ASPECTS: 9 CBF (<30%) Volume: 0  mL Perfusion (Tmax>6.0s) volume: 24mL Mismatch Volume: 48mL Infarction Location:none IMPRESSION: 1. Negative for emergent large vessel occlusion. 2. Moderate stenosis supraclinoid internal carotid artery bilaterally due to atherosclerotic disease. 3. No significant carotid or vertebral artery stenosis in the neck. 4. CT perfusion negative for infarct or delayed perfusion 5. These results were called by telephone at the time of interpretation on 03/13/2020 at 1:44 pm to provider Lindzen , who verbally acknowledged these results. Electronically Signed   By: Franchot Gallo M.D.   On: 03/13/2020 13:45   MR Brain W and Wo Contrast  Result Date: 03/13/2020 CLINICAL DATA:  Ataxia.  Possible stroke.  Monocular vision loss. EXAM: MRI HEAD AND ORBITS WITHOUT AND WITH CONTRAST TECHNIQUE: Multiplanar, multiecho pulse sequences of the brain and surrounding structures were obtained without and with intravenous contrast. Multiplanar, multiecho pulse sequences of the orbits and surrounding structures were obtained including fat saturation techniques, before and after intravenous  contrast administration. CONTRAST:  15mL GADAVIST GADOBUTROL 1 MMOL/ML IV SOLN COMPARISON:  CT studies same day. FINDINGS: MRI HEAD FINDINGS Brain: Diffusion imaging does not show any acute or subacute infarction. There is mild chronic small-vessel change of the pons. No focal cerebellar finding. Cerebral hemispheres show chronic atrophy and gliosis of the inferior frontal low on the right which could be due to an old stroke or old head trauma. Moderate chronic small-vessel ischemic changes elsewhere affecting the cerebral hemispheric white matter. No mass lesion, hemorrhage, hydrocephalus or extra-axial collection. Vascular: Major vessels at the base of the brain show flow. Skull and upper cervical spine: Negative Other: None MRI ORBITS FINDINGS Orbits: Both globes appear normal. Optic nerves appear normal. Orbital fat is normal. Extra-ocular muscles are normal. Lacrimal glands are normal. Visualized sinuses: No active inflammatory disease. Soft tissues: Negative Limited intracranial: CT brain exam. IMPRESSION: No acute intracranial finding.  No recent stroke. Moderate chronic small-vessel ischemic changes of the pons and cerebral hemispheric white matter. Atrophy and gliosis of the inferior frontal lobe on the right, probably secondary to distant closed head injury. No orbital finding. Electronically Signed   By: Nelson Chimes M.D.   On: 03/13/2020 15:56   CT CEREBRAL PERFUSION W CONTRAST  Result Date: 03/13/2020 CLINICAL DATA:  Stroke. Vomiting and  dizziness. Right-sided weakness. Blurred vision EXAM: CT ANGIOGRAPHY HEAD AND NECK CT PERFUSION BRAIN TECHNIQUE: Multidetector CT imaging of the head and neck was performed using the standard protocol during bolus administration of intravenous contrast. Multiplanar CT image reconstructions and MIPs were obtained to evaluate the vascular anatomy. Carotid stenosis measurements (when applicable) are obtained utilizing NASCET criteria, using the distal internal carotid  diameter as the denominator. Multiphase CT imaging of the brain was performed following IV bolus contrast injection. Subsequent parametric perfusion maps were calculated using RAPID software. CONTRAST:  157mL OMNIPAQUE IOHEXOL 350 MG/ML SOLN COMPARISON:  CT head earlier today FINDINGS: CTA NECK FINDINGS Aortic arch: Atherosclerotic aortic arch. Proximal great vessels widely patent. Three-vessel arch. Right carotid system: Right carotid bifurcation widely patent without significant stenosis. Left carotid system: Minimal atherosclerotic calcification left carotid bifurcation without significant stenosis. Vertebral arteries: Both vertebral arteries widely patent without stenosis. Skeleton: Negative Other neck: Negative for mass or adenopathy Upper chest: Lung apices clear bilaterally. Review of the MIP images confirms the above findings CTA HEAD FINDINGS Anterior circulation: Moderate stenosis of the supraclinoid internal carotid artery bilaterally due to atherosclerotic disease. Anterior and middle cerebral arteries patent without large vessel occlusion or significant stenosis. Posterior circulation: Both vertebral arteries patent to the basilar. PICA patent bilaterally. Basilar widely patent. Superior cerebellar and posterior cerebral arteries patent bilaterally without stenosis. Venous sinuses: Normal venous enhancement. Anatomic variants: None Review of the MIP images confirms the above findings CT Brain Perfusion Findings: ASPECTS: 9 CBF (<30%) Volume: 0  mL Perfusion (Tmax>6.0s) volume: 73mL Mismatch Volume: 55mL Infarction Location:none IMPRESSION: 1. Negative for emergent large vessel occlusion. 2. Moderate stenosis supraclinoid internal carotid artery bilaterally due to atherosclerotic disease. 3. No significant carotid or vertebral artery stenosis in the neck. 4. CT perfusion negative for infarct or delayed perfusion 5. These results were called by telephone at the time of interpretation on 03/13/2020 at 1:44 pm  to provider Lindzen , who verbally acknowledged these results. Electronically Signed   By: Franchot Gallo M.D.   On: 03/13/2020 13:45   CT HEAD CODE STROKE WO CONTRAST  Result Date: 03/13/2020 CLINICAL DATA:  Code stroke. Vomiting dizziness. Right-sided weakness and blurred vision. EXAM: CT HEAD WITHOUT CONTRAST TECHNIQUE: Contiguous axial images were obtained from the base of the skull through the vertex without intravenous contrast. COMPARISON:  None. FINDINGS: Brain: Mild atrophy. Low-density in the right frontal lobe above the orbit. Possible infarct of indeterminate age. Possible vasogenic edema related to mass lesion. Patchy white matter hypodensity bilaterally compatible with chronic microvascular ischemia. Negative for acute hemorrhage. Vascular: Negative for hyperdense vessel Skull: Negative Sinuses/Orbits: Mild mucosal edema paranasal sinuses. Negative orbit Other: None ASPECTS ([place] Stroke Program Early CT Score) - Ganglionic level infarction (caudate, lentiform nuclei, internal capsule, insula, M1-M3 cortex): 6 - Supraganglionic infarction (M4-M6 cortex): 3 Total score (0-10 with 10 being normal): None IMPRESSION: 1. Hypodensity right inferior frontal lobe of indeterminate etiology. No prior studies. Differential includes acute or chronic infarct as well as mass lesion. Recommend MRI brain without with contrast 2. ASPECTS is 9 due to possible infarct in the right frontal lobe. 3. Results texted to Dr. Cheral Marker. Electronically Signed   By: Franchot Gallo M.D.   On: 03/13/2020 13:23   MR ORBITS W WO CONTRAST  Result Date: 03/13/2020 CLINICAL DATA:  Ataxia.  Possible stroke.  Monocular vision loss. EXAM: MRI HEAD AND ORBITS WITHOUT AND WITH CONTRAST TECHNIQUE: Multiplanar, multiecho pulse sequences of the brain and surrounding structures were obtained without and  with intravenous contrast. Multiplanar, multiecho pulse sequences of the orbits and surrounding structures were obtained including fat  saturation techniques, before and after intravenous contrast administration. CONTRAST:  7mL GADAVIST GADOBUTROL 1 MMOL/ML IV SOLN COMPARISON:  CT studies same day. FINDINGS: MRI HEAD FINDINGS Brain: Diffusion imaging does not show any acute or subacute infarction. There is mild chronic small-vessel change of the pons. No focal cerebellar finding. Cerebral hemispheres show chronic atrophy and gliosis of the inferior frontal low on the right which could be due to an old stroke or old head trauma. Moderate chronic small-vessel ischemic changes elsewhere affecting the cerebral hemispheric white matter. No mass lesion, hemorrhage, hydrocephalus or extra-axial collection. Vascular: Major vessels at the base of the brain show flow. Skull and upper cervical spine: Negative Other: None MRI ORBITS FINDINGS Orbits: Both globes appear normal. Optic nerves appear normal. Orbital fat is normal. Extra-ocular muscles are normal. Lacrimal glands are normal. Visualized sinuses: No active inflammatory disease. Soft tissues: Negative Limited intracranial: CT brain exam. IMPRESSION: No acute intracranial finding.  No recent stroke. Moderate chronic small-vessel ischemic changes of the pons and cerebral hemispheric white matter. Atrophy and gliosis of the inferior frontal lobe on the right, probably secondary to distant closed head injury. No orbital finding. Electronically Signed   By: Nelson Chimes M.D.   On: 03/13/2020 15:56    Medications:   . aspirin EC  81 mg Oral Daily  . enoxaparin (LOVENOX) injection  40 mg Subcutaneous Q24H  . fenofibrate  54 mg Oral Daily  . insulin aspart  0-15 Units Subcutaneous TID WC  . insulin aspart  0-5 Units Subcutaneous QHS  . pantoprazole  40 mg Oral Daily  . rosuvastatin  40 mg Oral q1800   Continuous Infusions:   LOS: 0 days   Radene Gunning NP [facility]   How to contact the [facility] Attending or Consulting provider [place] or covering provider during after hours [place], for  this patient?  1. Check the care team in [employer] and look for a) attending/consulting [facility] provider listed and b) the [facility] team listed 2. Log into [email or web address] and use [place]'s universal password to access. If you do not have the password, please contact the hospital operator. 3. Locate the [facility] provider you are looking for under [facility] and page to a number that you can be directly reached. 4. If you still have difficulty reaching the provider, please page the [facility] (Director on Call) for the Hospitalists listed on amion for assistance.  03/14/2020, 10:22 AM

## 2020-03-14 NOTE — Evaluation (Signed)
Speech Language Pathology Evaluation Patient Details Name: THESEUS MALBROUGH MRN: [id number] DOB: [birth year] Today's Date: 03/14/2020 Time: [id number] SLP Time Calculation (min) (ACUTE ONLY): 17 min  Problem List:  Patient Active Problem List   Diagnosis Date Noted  . Vision, loss, sudden 03/14/2020  . CVA (cerebral vascular accident) ([place]) 03/13/2020  . GERD (gastroesophageal reflux disease)   . Diabetic ketoacidosis without coma associated with type 2 diabetes mellitus ([place])   . DM (diabetes mellitus) ([place])   . Severe hyperglycemia due to diabetes mellitus ([place]) 02/13/2020  . Hypertriglyceridemia 02/13/2020  . Malignant neoplasm of prostate ([place]) 12/12/2018  . Abnormal cardiac function test 01/14/2016  . Abnormal EKG   . Hyperlipidemia 01/11/2016  . Tobacco abuse 01/11/2016  . Epigastric pain 01/11/2016  . Gynecomastia, male 01/11/2016  . AKI (acute kidney injury) ([place]) 01/11/2016  . Atypical chest pain 01/11/2016  . Statin myopathy 02/04/2012  . Chest pain 02/03/2012  . Rhabdomyolysis 02/03/2012  . HTN (hypertension) 02/03/2012  . Alcohol abuse 02/03/2012  . Cocaine abuse ([place]) 02/03/2012   Past Medical History:  Past Medical History:  Diagnosis Date  . Angina   . Diabetes mellitus without complication ([place])   . GERD (gastroesophageal reflux disease)   . High cholesterol   . Hypertension   . Prostate cancer [facility])    Past Surgical History:  Past Surgical History:  Procedure Laterality Date  . CARDIAC CATHETERIZATION  1990's  . PROSTATE BIOPSY     HPI:  Pt  is a 69 y.o. male with medical history significant of hypertension, hyperlipidemia, GERD, diabetes mellitus who presented to the ED due to sudden onset of right-sided vision loss and gait unsteadiness. MRI: No acute intracranial finding.  No recent stroke. Atrophy and gliosis of the inferior frontal lobe on the right, probably secondary to distant closed head injury.   Assessment / Plan / Recommendation Clinical  Impression  Pt participated in speech/language evaluation and denied baseline or acute changes in speech, cognition or language. He reported that he is not employed and currently lives with his brother. His speech and language skills are currently within normal limits and cognitive-linguistic skills were within functional limits with some mild difficulty with memory. Further skilled SLP services are not clinically indicated at this time. Pt was educated regarding results and recommendations; he verbalized understanding as well as agreement with plan of care.    SLP Assessment  SLP Recommendation/Assessment: Patient does not need any further Speech Lanaguage Pathology Services SLP Visit Diagnosis: Cognitive communication deficit (R41.841)    Follow Up Recommendations  None    Frequency and Duration           SLP Evaluation Cognition  Overall Cognitive Status: Within Functional Limits for tasks assessed Arousal/Alertness: Awake/alert Orientation Level: Oriented X4 Attention: Focused;Sustained Focused Attention: Appears intact Sustained Attention: Impaired Sustained Attention Impairment: Verbal complex Memory: Impaired Memory Impairment: Decreased recall of new information;Retrieval deficit(Immediate: 3/3; delayed: 1/3; with cues: 2/2) Awareness: Appears intact Problem Solving: Appears intact Executive Function: Reasoning;Sequencing Reasoning: Appears intact Sequencing: Appears intact       Comprehension  Auditory Comprehension Overall Auditory Comprehension: Appears within functional limits for tasks assessed Yes/No Questions: Within Functional Limits Basic Biographical Questions: (5/5) Complex Questions: (4/5) Paragraph Comprehension (via yes/no questions): (3/4) Commands: Within Functional Limits Two Step Basic Commands: (4/4) Multistep Basic Commands: (3/4) Conversation: Complex Visual Recognition/Discrimination Discrimination: Within Function Limits Reading  Comprehension Reading Status: Not tested    Expression Expression Primary Mode of Expression: Verbal Verbal Expression Overall Verbal  Expression: Appears within functional limits for tasks assessed Initiation: No impairment Automatic Speech: Counting;Day of week;Month of year(WNL) Level of Generative/Spontaneous Verbalization: Conversation Repetition: No impairment(5/5) Naming: No impairment Responsive: (4/5) Confrontation: Within functional limits(10/10) Convergent: (5/5) Written Expression Dominant Hand: Right   Oral / Motor  Oral Motor/Sensory Function Overall Oral Motor/Sensory Function: Within functional limits Motor Speech Overall Motor Speech: Appears within functional limits for tasks assessed Respiration: Within functional limits Phonation: Normal Resonance: Within functional limits Articulation: Within functional limitis Intelligibility: Intelligible Motor Planning: Witnin functional limits Motor Speech Errors: Not applicable   Damarius Karnes I. Hardin Negus, [place], [place] Office number [phone] Pager [place] 03/14/2020, 5:09 PM

## 2020-03-14 NOTE — Evaluation (Signed)
Physical Therapy Evaluation Patient Details Name: Theodore Wood MRN: [id number] DOB: 09/17/1951 Today's Date: 03/14/2020   History of Present Illness  Pt is a 69 y.o. M with significant PMH of diabetes, HTN, who presents with loss of vision in right eye with associated symptoms of dizziness and gait unsteadiness. CT of the head shows hypodensity right inferior frontal lobe indeterminate etiology. MRI no acute intracranial finding.   Clinical Impression  Prior to admission, pt lives with his brother and is independent; does not drive. Pt presents with right eye diplopia resulting in dynamic balance impairments and dizziness. Pt reporting improvement with right eye occluded. Ambulating 200 feet at a min guard assist level. Able to locate signage in hallway with increased time and min cues. Will need continued education regarding functional impact of visual impairment and recommend [facility] safety evaluation upon discharge.     Follow Up Recommendations Home health PT;Supervision/Assistance - 24 hour    Equipment Recommendations  None recommended by PT    Recommendations for Other Services       Precautions / Restrictions Precautions Precautions: Fall;Other (comment) Precaution Comments: R eye diplopia Restrictions Weight Bearing Restrictions: No      Mobility  Bed Mobility Overal bed mobility: Independent                Transfers Overall transfer level: Needs assistance Equipment used: None Transfers: Sit to/from Stand Sit to Stand: Supervision            Ambulation/Gait Ambulation/Gait assistance: Min guard Gait Distance (Feet): 200 Feet Assistive device: None Gait Pattern/deviations: Drifts right/left     General Gait Details: Min guard for stability due to low vision, increased difficulty with obstacle negotiation, requiring cues  Stairs            Wheelchair Mobility    Modified Rankin (Stroke Patients Only) Modified Rankin (Stroke Patients  Only) Pre-Morbid Rankin Score: No symptoms Modified Rankin: Moderately severe disability     Balance Overall balance assessment: Mild deficits observed, not formally tested                                           Pertinent Vitals/Pain Pain Assessment: No/denies pain    Home Living Family/patient expects to be discharged to:: Private residence Living Arrangements: Other relatives(brother) Available Help at Discharge: Family Type of Home: House Home Access: Stairs to enter   Technical brewer of Steps: 3 Home Layout: One level Home Equipment: None      Prior Function Level of Independence: Independent         Comments: Does not work, drive. Independent with ADL's/IADL's     Hand Dominance        Extremity/Trunk Assessment   Upper Extremity Assessment Upper Extremity Assessment: Defer to OT evaluation    Lower Extremity Assessment Lower Extremity Assessment: RLE deficits/detail;LLE deficits/detail RLE Deficits / Details: Strength 5/5 LLE Deficits / Details: Strength 5/5    Cervical / Trunk Assessment Cervical / Trunk Assessment: Normal  Communication   Communication: No difficulties  Cognition Arousal/Alertness: Awake/alert Behavior During Therapy: Impulsive Overall Cognitive Status: Within Functional Limits for tasks assessed                                 General Comments: Pt is mildly impulsive, likes to joke and is distractable. Suspect likely  baseline.      General Comments      Exercises     Assessment/Plan    PT Assessment Patient needs continued PT services  PT Problem List Decreased balance;Decreased safety awareness       PT Treatment Interventions Gait training;Stair training;Functional mobility training;Therapeutic activities;Therapeutic exercise;Balance training;Patient/family education    PT Goals (Current goals can be found in the Care Plan section)  Acute Rehab PT Goals Patient Stated  Goal: for vision to improve PT Goal Formulation: With patient Time For Goal Achievement: 03/28/20 Potential to Achieve Goals: Good    Frequency Min 4X/week   Barriers to discharge        Co-evaluation               AM-PAC PT "6 Clicks" Mobility  Outcome Measure Help needed turning from your back to your side while in a flat bed without using bedrails?: None Help needed moving from lying on your back to sitting on the side of a flat bed without using bedrails?: None Help needed moving to and from a bed to a chair (including a wheelchair)?: None Help needed standing up from a chair using your arms (e.g., wheelchair or bedside chair)?: None Help needed to walk in hospital room?: A Little Help needed climbing 3-5 steps with a railing? : A Little 6 Click Score: 22    End of Session Equipment Utilized During Treatment: Gait belt Activity Tolerance: Patient tolerated treatment well Patient left: in chair;with call bell/phone within reach;with chair alarm set Nurse Communication: Mobility status PT Visit Diagnosis: Unsteadiness on feet (R26.81);Difficulty in walking, not elsewhere classified (R26.2)    Time: [id number] PT Time Calculation (min) (ACUTE ONLY): 17 min   Charges:   PT Evaluation $PT Eval Moderate Complexity: 1 Mod            Wyona Almas, PT, DPT Acute Rehabilitation Services Pager [phone] Office [phone]   Deno Etienne 03/14/2020, 1:18 PM

## 2020-03-14 NOTE — Progress Notes (Signed)
Occupational Therapy Evaluation Patient Details Name: Theodore Wood MRN: [id number] DOB: 03-12-1951 Today's Date: 03/14/2020    History of Present Illness Pt is a 69 y.o. M with significant PMH of diabetes, HTN, who presents with loss of vision in right eye with associated symptoms of dizziness and gait unsteadiness. CT of the head shows hypodensity right inferior frontal lobe indeterminate etiology. MRI no acute intracranial finding.    Clinical Impression   PTA pt lived with his brother, independent in all ADL, IADL, and mobility tasks. Pt currently independent to min guard for self-care and mobility tasks. Upon OT arrival, pt ambulating around room with staff, running into objects and reporting double vision. Vision assessed and occluded glasses on right provided. Assessed pt again, with pt able to ambulate around room, complete toileting task, and hygiene at the sink with variable min guard to supervision. Noted less running into objects with pt reporting improvement in double vision. Pt requires mod cues for safety as he is easily distracted and tends to joke around. Educated pt on safety strategies, fall prevention, and activity pacing techniques with poor to fair understanding and follow through. Pt demonstrates decreased strength, endurance, balance, standing tolerance, activity tolerance, safety awareness, and visual perceptual deficits impacting ability to complete self-care and functional transfer tasks safely and independently. Recommend skilled OT services to address above deficits in order to promote function and prevent further decline. Recommend [place] OT for continued rehab following hospital discharge.     Follow Up Recommendations  Home health OT;Supervision/Assistance - 24 hour    Equipment Recommendations  Tub/shower seat    Recommendations for Other Services Other (comment)(Follow up with Neuro Ophthalmologist)     Precautions / Restrictions Precautions Precautions:  Fall;Other (comment) Precaution Comments: R eye diplopia Restrictions Weight Bearing Restrictions: No      Mobility Bed Mobility               General bed mobility comments: Pt seated in bedside chair upon OT arrival.  Transfers Overall transfer level: Needs assistance Equipment used: None Transfers: Sit to/from Stand Sit to Stand: Supervision         General transfer comment: Cues for safety    Balance Overall balance assessment: Mild deficits observed, not formally tested                                         ADL either performed or assessed with clinical judgement   ADL Overall ADL's : Needs assistance/impaired Eating/Feeding: Independent;Sitting   Grooming: Set up;Supervision/safety;Standing   Upper Body Bathing: Set up;Supervision/ safety;Sitting   Lower Body Bathing: Set up;Supervison/ safety;Sit to/from stand   Upper Body Dressing : Set up;Sitting   Lower Body Dressing: Set up;Supervision/safety;Sit to/from stand   Toilet Transfer: Set up;Supervision/safety;Ambulation;Regular Toilet;Grab bars   Toileting- Clothing Manipulation and Hygiene: Set up;Supervision/safety;Sit to/from stand       Functional mobility during ADLs: Supervision/safety;Min guard General ADL Comments: Pt able to ambulate to/from bathroom x 2 without an AD. Noted 0 instances of LOB, however pt likes to joke around and pretend to lose balance. Pt noted to be running into objects while ambulating due to visual deficits.      Vision Baseline Vision/History: Wears glasses Wears Glasses: At all times(however pt reports that he hasn't been using them.) Patient Visual Report: Diplopia;Blurring of vision Vision Assessment?: Yes Eye Alignment: Impaired (comment)(Right eye deviated upwards. Left eye neutral  alignment) Ocular Range of Motion: Within Functional Limits Tracking/Visual Pursuits: (Decreased smoothness with right eye tracking in all quadrant) Convergence:  Impaired (comment) Diplopia Assessment: Disappears with one eye closed(Disappears with right eye closed) Additional Comments: Peripheral vision intact. Nystagmus noted bilaterally. With right eye closed, diplopia disappears. With left eye closed, diplopia appears ~3 ft from face. Pt reports right eye acuity is worse than left.      Perception     Praxis      Pertinent Vitals/Pain Pain Assessment: No/denies pain     Hand Dominance Right   Extremity/Trunk Assessment Upper Extremity Assessment Upper Extremity Assessment: Overall WFL for tasks assessed   Lower Extremity Assessment Lower Extremity Assessment: Defer to PT evaluation       Communication Communication Communication: No difficulties   Cognition Arousal/Alertness: Awake/alert Behavior During Therapy: Impulsive Overall Cognitive Status: Within Functional Limits for tasks assessed                                 General Comments: Pt requires mod to max cues for safety and to slow pace. Pt likes to joke around and is easily distractable.    General Comments  Assessed pt before and after tapped glasses provided.     Exercises     Shoulder Instructions      Home Living Family/patient expects to be discharged to:: Private residence Living Arrangements: Other relatives(Brother) Available Help at Discharge: Family Type of Home: House Home Access: Stairs to enter Technical brewer of Steps: 3   Home Layout: One level     Bathroom Shower/Tub: Tub/shower unit         Home Equipment: None          Prior Functioning/Environment Level of Independence: Independent        Comments: Does not work, drive. Independent with ADL's/IADL's        OT Problem List: Decreased activity tolerance;Impaired balance (sitting and/or standing);Impaired vision/perception;Decreased safety awareness      OT Treatment/Interventions: Self-care/ADL training;Therapeutic exercise;Neuromuscular  education;Energy conservation;DME and/or AE instruction;Therapeutic activities;Visual/perceptual remediation/compensation;Patient/family education;Balance training    OT Goals(Current goals can be found in the care plan section) Acute Rehab OT Goals Patient Stated Goal: for vision to improve Time For Goal Achievement: 03/28/20 Potential to Achieve Goals: Good ADL Goals Pt Will Perform Tub/Shower Transfer: Tub transfer;with supervision;grab bars Additional ADL Goal #1: Pt to complete all ADLs independently, requring 0 verbal cues for safety. Additional ADL Goal #2: Pt to tolerate wearing occluded glasses 75% of the time during ADL routine to optimize independence and safety. Additional ADL Goal #3: Pt to recall and verbalize 3 fall prevention strategies with 0 verbal cues. Additional ADL Goal #4: Pt to tolerate standing up to 10 min independently, in preparation for ADLs.  OT Frequency: Min 3X/week   Barriers to D/C:            Co-evaluation              AM-PAC OT "6 Clicks" Daily Activity     Outcome Measure Help from another person eating meals?: None Help from another person taking care of personal grooming?: A Little Help from another person toileting, which includes using toliet, bedpan, or urinal?: A Little Help from another person bathing (including washing, rinsing, drying)?: A Little Help from another person to put on and taking off regular upper body clothing?: A Little Help from another person to put on and taking off regular  lower body clothing?: A Little 6 Click Score: 19   End of Session Nurse Communication: Mobility status  Activity Tolerance: Patient tolerated treatment well Patient left: in chair;with call bell/phone within reach;with chair alarm set  OT Visit Diagnosis: Unsteadiness on feet (R26.81);Other abnormalities of gait and mobility (R26.89);Low vision, both eyes (H54.2)                Time: [id number] OT Time Calculation (min): 33 min Charges:  OT  General Charges $OT Visit: 1 Visit OT Evaluation $OT Eval Moderate Complexity: 1 Mod OT Treatments $Therapeutic Activity: 8-22 mins  Mauri Brooklyn OTR/L [phone]  Mauri Brooklyn 03/14/2020, 3:30 PM

## 2020-03-14 NOTE — Progress Notes (Signed)
  Echocardiogram 2D Echocardiogram has been performed.  Jennette Dubin 03/14/2020, 9:06 AM

## 2020-03-14 NOTE — Progress Notes (Signed)
STROKE TEAM PROGRESS NOTE   INTERVAL HISTORY Pt reports hx of double and quadruple vision x 2 weeks. Opthamologist 3 years ago felt blurriness was related to Elevated sugars, so he thought that was the issue.     .  I personally reviewed history of presenting illness with the patient, electronic medical records and imaging films in PACS.  MRI scan of the brain personally reviewed shows no acute infarct.  CT angiogram of the neck shows no significant extracranial stenosis.  CT angiogram of the brain shows bilateral moderate cavernous ICA stenosis.  Echocardiogram is pending.  Vitals:   03/13/20 2125 03/13/20 2301 03/14/20 0401 03/14/20 0829  BP: 126/68 136/73 126/77 (!) 145/99  Pulse: 73 73 73 71  Resp: 15 17 20 20   Temp: 98.1 F (36.7 C) 98.1 F (36.7 C) 98.2 F (36.8 C) 98.3 F (36.8 C)  TempSrc: Oral Oral Oral Oral  SpO2: 100% 100% 100% 100%  Height:        CBC:  Recent Labs  Lab 03/13/20 1254 03/13/20 1254 03/13/20 1300 03/14/20 0405  WBC 6.2  --   --  5.9  NEUTROABS 2.7  --   --  3.9  HGB 14.7   < > 15.0 13.3  HCT 45.1   < > 44.0 40.9  MCV 89.5  --   --  89.3  PLT 369  --   --  330   < > = values in this interval not displayed.    Basic Metabolic Panel:  Recent Labs  Lab 03/13/20 1254 03/13/20 1254 03/13/20 1300 03/14/20 0405  NA 141   < > 143 140  K 3.7   < > 3.8 3.7  CL 106   < > 106 106  CO2 22  --   --  24  GLUCOSE 93   < > 95 147*  BUN 11   < > 13 11  CREATININE 0.90   < > 0.80 0.94  CALCIUM 9.9  --   --  9.3   < > = values in this interval not displayed.   Lipid Panel:     Component Value Date/Time   CHOL 191 03/14/2020 0405   TRIG 281 (H) 03/14/2020 0405   HDL 25 (L) 03/14/2020 0405   CHOLHDL 7.6 03/14/2020 0405   VLDL 56 (H) 03/14/2020 0405   LDLCALC 110 (H) 03/14/2020 0405   HgbA1c:  Lab Results  Component Value Date   HGBA1C 9.7 (H) 03/14/2020   Urine Drug Screen:     Component Value Date/Time   LABOPIA NONE DETECTED 03/14/2020 0809    COCAINSCRNUR NONE DETECTED 03/14/2020 0809   LABBENZ NONE DETECTED 03/14/2020 0809   AMPHETMU NONE DETECTED 03/14/2020 0809   THCU NONE DETECTED 03/14/2020 0809   LABBARB NONE DETECTED 03/14/2020 0809    Alcohol Level     Component Value Date/Time   ETH <10 03/13/2020 1254    IMAGING past 24 hours CT ANGIO HEAD W OR WO CONTRAST  Result Date: 03/13/2020 CLINICAL DATA:  Stroke. Vomiting and dizziness. Right-sided weakness. Blurred vision EXAM: CT ANGIOGRAPHY HEAD AND NECK CT PERFUSION BRAIN TECHNIQUE: Multidetector CT imaging of the head and neck was performed using the standard protocol during bolus administration of intravenous contrast. Multiplanar CT image reconstructions and MIPs were obtained to evaluate the vascular anatomy. Carotid stenosis measurements (when applicable) are obtained utilizing NASCET criteria, using the distal internal carotid diameter as the denominator. Multiphase CT imaging of the brain was performed following IV bolus contrast  injection. Subsequent parametric perfusion maps were calculated using RAPID software. CONTRAST:  152mL OMNIPAQUE IOHEXOL 350 MG/ML SOLN COMPARISON:  CT head earlier today FINDINGS: CTA NECK FINDINGS Aortic arch: Atherosclerotic aortic arch. Proximal great vessels widely patent. Three-vessel arch. Right carotid system: Right carotid bifurcation widely patent without significant stenosis. Left carotid system: Minimal atherosclerotic calcification left carotid bifurcation without significant stenosis. Vertebral arteries: Both vertebral arteries widely patent without stenosis. Skeleton: Negative Other neck: Negative for mass or adenopathy Upper chest: Lung apices clear bilaterally. Review of the MIP images confirms the above findings CTA HEAD FINDINGS Anterior circulation: Moderate stenosis of the supraclinoid internal carotid artery bilaterally due to atherosclerotic disease. Anterior and middle cerebral arteries patent without large vessel occlusion  or significant stenosis. Posterior circulation: Both vertebral arteries patent to the basilar. PICA patent bilaterally. Basilar widely patent. Superior cerebellar and posterior cerebral arteries patent bilaterally without stenosis. Venous sinuses: Normal venous enhancement. Anatomic variants: None Review of the MIP images confirms the above findings CT Brain Perfusion Findings: ASPECTS: 9 CBF (<30%) Volume: 0  mL Perfusion (Tmax>6.0s) volume: 7mL Mismatch Volume: 45mL Infarction Location:none IMPRESSION: 1. Negative for emergent large vessel occlusion. 2. Moderate stenosis supraclinoid internal carotid artery bilaterally due to atherosclerotic disease. 3. No significant carotid or vertebral artery stenosis in the neck. 4. CT perfusion negative for infarct or delayed perfusion 5. These results were called by telephone at the time of interpretation on 03/13/2020 at 1:44 pm to provider Lindzen , who verbally acknowledged these results. Electronically Signed   By: Franchot Gallo M.D.   On: 03/13/2020 13:45   CT ANGIO NECK W OR WO CONTRAST  Result Date: 03/13/2020 CLINICAL DATA:  Stroke. Vomiting and dizziness. Right-sided weakness. Blurred vision EXAM: CT ANGIOGRAPHY HEAD AND NECK CT PERFUSION BRAIN TECHNIQUE: Multidetector CT imaging of the head and neck was performed using the standard protocol during bolus administration of intravenous contrast. Multiplanar CT image reconstructions and MIPs were obtained to evaluate the vascular anatomy. Carotid stenosis measurements (when applicable) are obtained utilizing NASCET criteria, using the distal internal carotid diameter as the denominator. Multiphase CT imaging of the brain was performed following IV bolus contrast injection. Subsequent parametric perfusion maps were calculated using RAPID software. CONTRAST:  112mL OMNIPAQUE IOHEXOL 350 MG/ML SOLN COMPARISON:  CT head earlier today FINDINGS: CTA NECK FINDINGS Aortic arch: Atherosclerotic aortic arch. Proximal great  vessels widely patent. Three-vessel arch. Right carotid system: Right carotid bifurcation widely patent without significant stenosis. Left carotid system: Minimal atherosclerotic calcification left carotid bifurcation without significant stenosis. Vertebral arteries: Both vertebral arteries widely patent without stenosis. Skeleton: Negative Other neck: Negative for mass or adenopathy Upper chest: Lung apices clear bilaterally. Review of the MIP images confirms the above findings CTA HEAD FINDINGS Anterior circulation: Moderate stenosis of the supraclinoid internal carotid artery bilaterally due to atherosclerotic disease. Anterior and middle cerebral arteries patent without large vessel occlusion or significant stenosis. Posterior circulation: Both vertebral arteries patent to the basilar. PICA patent bilaterally. Basilar widely patent. Superior cerebellar and posterior cerebral arteries patent bilaterally without stenosis. Venous sinuses: Normal venous enhancement. Anatomic variants: None Review of the MIP images confirms the above findings CT Brain Perfusion Findings: ASPECTS: 9 CBF (<30%) Volume: 0  mL Perfusion (Tmax>6.0s) volume: 59mL Mismatch Volume: 70mL Infarction Location:none IMPRESSION: 1. Negative for emergent large vessel occlusion. 2. Moderate stenosis supraclinoid internal carotid artery bilaterally due to atherosclerotic disease. 3. No significant carotid or vertebral artery stenosis in the neck. 4. CT perfusion negative for infarct or delayed perfusion  5. These results were called by telephone at the time of interpretation on 03/13/2020 at 1:44 pm to provider Lindzen , who verbally acknowledged these results. Electronically Signed   By: Franchot Gallo M.D.   On: 03/13/2020 13:45   MR Brain W and Wo Contrast  Result Date: 03/13/2020 CLINICAL DATA:  Ataxia.  Possible stroke.  Monocular vision loss. EXAM: MRI HEAD AND ORBITS WITHOUT AND WITH CONTRAST TECHNIQUE: Multiplanar, multiecho pulse sequences  of the brain and surrounding structures were obtained without and with intravenous contrast. Multiplanar, multiecho pulse sequences of the orbits and surrounding structures were obtained including fat saturation techniques, before and after intravenous contrast administration. CONTRAST:  37mL GADAVIST GADOBUTROL 1 MMOL/ML IV SOLN COMPARISON:  CT studies same day. FINDINGS: MRI HEAD FINDINGS Brain: Diffusion imaging does not show any acute or subacute infarction. There is mild chronic small-vessel change of the pons. No focal cerebellar finding. Cerebral hemispheres show chronic atrophy and gliosis of the inferior frontal low on the right which could be due to an old stroke or old head trauma. Moderate chronic small-vessel ischemic changes elsewhere affecting the cerebral hemispheric white matter. No mass lesion, hemorrhage, hydrocephalus or extra-axial collection. Vascular: Major vessels at the base of the brain show flow. Skull and upper cervical spine: Negative Other: None MRI ORBITS FINDINGS Orbits: Both globes appear normal. Optic nerves appear normal. Orbital fat is normal. Extra-ocular muscles are normal. Lacrimal glands are normal. Visualized sinuses: No active inflammatory disease. Soft tissues: Negative Limited intracranial: CT brain exam. IMPRESSION: No acute intracranial finding.  No recent stroke. Moderate chronic small-vessel ischemic changes of the pons and cerebral hemispheric white matter. Atrophy and gliosis of the inferior frontal lobe on the right, probably secondary to distant closed head injury. No orbital finding. Electronically Signed   By: Nelson Chimes M.D.   On: 03/13/2020 15:56   CT CEREBRAL PERFUSION W CONTRAST  Result Date: 03/13/2020 CLINICAL DATA:  Stroke. Vomiting and dizziness. Right-sided weakness. Blurred vision EXAM: CT ANGIOGRAPHY HEAD AND NECK CT PERFUSION BRAIN TECHNIQUE: Multidetector CT imaging of the head and neck was performed using the standard protocol during bolus  administration of intravenous contrast. Multiplanar CT image reconstructions and MIPs were obtained to evaluate the vascular anatomy. Carotid stenosis measurements (when applicable) are obtained utilizing NASCET criteria, using the distal internal carotid diameter as the denominator. Multiphase CT imaging of the brain was performed following IV bolus contrast injection. Subsequent parametric perfusion maps were calculated using RAPID software. CONTRAST:  133mL OMNIPAQUE IOHEXOL 350 MG/ML SOLN COMPARISON:  CT head earlier today FINDINGS: CTA NECK FINDINGS Aortic arch: Atherosclerotic aortic arch. Proximal great vessels widely patent. Three-vessel arch. Right carotid system: Right carotid bifurcation widely patent without significant stenosis. Left carotid system: Minimal atherosclerotic calcification left carotid bifurcation without significant stenosis. Vertebral arteries: Both vertebral arteries widely patent without stenosis. Skeleton: Negative Other neck: Negative for mass or adenopathy Upper chest: Lung apices clear bilaterally. Review of the MIP images confirms the above findings CTA HEAD FINDINGS Anterior circulation: Moderate stenosis of the supraclinoid internal carotid artery bilaterally due to atherosclerotic disease. Anterior and middle cerebral arteries patent without large vessel occlusion or significant stenosis. Posterior circulation: Both vertebral arteries patent to the basilar. PICA patent bilaterally. Basilar widely patent. Superior cerebellar and posterior cerebral arteries patent bilaterally without stenosis. Venous sinuses: Normal venous enhancement. Anatomic variants: None Review of the MIP images confirms the above findings CT Brain Perfusion Findings: ASPECTS: 9 CBF (<30%) Volume: 0  mL Perfusion (Tmax>6.0s) volume: 65mL Mismatch  Volume: 37mL Infarction Location:none IMPRESSION: 1. Negative for emergent large vessel occlusion. 2. Moderate stenosis supraclinoid internal carotid artery  bilaterally due to atherosclerotic disease. 3. No significant carotid or vertebral artery stenosis in the neck. 4. CT perfusion negative for infarct or delayed perfusion 5. These results were called by telephone at the time of interpretation on 03/13/2020 at 1:44 pm to provider Lindzen , who verbally acknowledged these results. Electronically Signed   By: Franchot Gallo M.D.   On: 03/13/2020 13:45   CT HEAD CODE STROKE WO CONTRAST  Result Date: 03/13/2020 CLINICAL DATA:  Code stroke. Vomiting dizziness. Right-sided weakness and blurred vision. EXAM: CT HEAD WITHOUT CONTRAST TECHNIQUE: Contiguous axial images were obtained from the base of the skull through the vertex without intravenous contrast. COMPARISON:  None. FINDINGS: Brain: Mild atrophy. Low-density in the right frontal lobe above the orbit. Possible infarct of indeterminate age. Possible vasogenic edema related to mass lesion. Patchy white matter hypodensity bilaterally compatible with chronic microvascular ischemia. Negative for acute hemorrhage. Vascular: Negative for hyperdense vessel Skull: Negative Sinuses/Orbits: Mild mucosal edema paranasal sinuses. Negative orbit Other: None ASPECTS ([place] Stroke Program Early CT Score) - Ganglionic level infarction (caudate, lentiform nuclei, internal capsule, insula, M1-M3 cortex): 6 - Supraganglionic infarction (M4-M6 cortex): 3 Total score (0-10 with 10 being normal): None IMPRESSION: 1. Hypodensity right inferior frontal lobe of indeterminate etiology. No prior studies. Differential includes acute or chronic infarct as well as mass lesion. Recommend MRI brain without with contrast 2. ASPECTS is 9 due to possible infarct in the right frontal lobe. 3. Results texted to Dr. Cheral Marker. Electronically Signed   By: Franchot Gallo M.D.   On: 03/13/2020 13:23   MR ORBITS W WO CONTRAST  Result Date: 03/13/2020 CLINICAL DATA:  Ataxia.  Possible stroke.  Monocular vision loss. EXAM: MRI HEAD AND ORBITS WITHOUT AND  WITH CONTRAST TECHNIQUE: Multiplanar, multiecho pulse sequences of the brain and surrounding structures were obtained without and with intravenous contrast. Multiplanar, multiecho pulse sequences of the orbits and surrounding structures were obtained including fat saturation techniques, before and after intravenous contrast administration. CONTRAST:  107mL GADAVIST GADOBUTROL 1 MMOL/ML IV SOLN COMPARISON:  CT studies same day. FINDINGS: MRI HEAD FINDINGS Brain: Diffusion imaging does not show any acute or subacute infarction. There is mild chronic small-vessel change of the pons. No focal cerebellar finding. Cerebral hemispheres show chronic atrophy and gliosis of the inferior frontal low on the right which could be due to an old stroke or old head trauma. Moderate chronic small-vessel ischemic changes elsewhere affecting the cerebral hemispheric white matter. No mass lesion, hemorrhage, hydrocephalus or extra-axial collection. Vascular: Major vessels at the base of the brain show flow. Skull and upper cervical spine: Negative Other: None MRI ORBITS FINDINGS Orbits: Both globes appear normal. Optic nerves appear normal. Orbital fat is normal. Extra-ocular muscles are normal. Lacrimal glands are normal. Visualized sinuses: No active inflammatory disease. Soft tissues: Negative Limited intracranial: CT brain exam. IMPRESSION: No acute intracranial finding.  No recent stroke. Moderate chronic small-vessel ischemic changes of the pons and cerebral hemispheric white matter. Atrophy and gliosis of the inferior frontal lobe on the right, probably secondary to distant closed head injury. No orbital finding. Electronically Signed   By: Nelson Chimes M.D.   On: 03/13/2020 15:56    PHYSICAL EXAM Obese middle-aged African-American male not in distress. . Afebrile. Head is nontraumatic. Neck is supple without bruit.    Cardiac exam no murmur or gallop. Lungs are clear to auscultation.  Distal pulses are well felt. Neurological  Exam : Awake alert oriented to time place and person.  Speech is fluent without dysarthria or aphasia.  He has slight hyper tropia of the right eye.  Extraocular movements appear full range but he complains of subjective diplopia more in horizontal than vertical planes which is binocular and disappears with closing either eye.  Vision acuity appears to be diminished in the right eye compared to the left but patient states that this is old.  Face is symmetric without weakness.  Tongue is midline.  Motor system exam shows symmetric upper and lower extremity strength with only trace weakness of left grip and diminished fine finger movements.  Orbits right over left upper extremity.  Symmetric lower extremity strength.  Gait not tested.  Sensation is intact. ASSESSMENT/PLAN Mr. Theodore Wood is a 69 y.o. male with history of HTN, HLD, DB, GERD, angina, prostate cancer presenting with gait unsteadiness and R monocular vision loss.   Stroke:   Small brainstem infarct not seen on imaging with ongoing vision abnormalities, infarct  secondary to small vessel disease likely  Code Stroke CT head hypodensity R inferior frontal lobe. ASPECTS 9    CTA head no LVO. B supraclinoid ICA moderate stenosis.   CTA neck Unremarkable   CT perfusion negative  MRI  No acute stroke. Moderate small vessel disease. Atrophy and gliosis inferior R frontal lobe d/t old TBI.  MRI orbits  Unremarkable   2D Echo EF 60-65%. No source of embolus   LDL 110  HgbA1c 9.7  Lovenox 40 mg sq daily for VTE prophylaxis  No antithrombotic prior to admission, now on aspirin 81 mg daily. Will add plavix. Continue DAPT x 3 weeks then aspirin alone.    Therapy recommendations:  HH OT, HH PT  Disposition:  pending   Hypertension  Stable . BP goal normotensive  Hyperlipidemia  Home meds:  crestor 40, resumed in hospital  LDL 110, goal < 70  Continue statin at discharge  Diabetes type II Uncontrolled Diabetic  Retinopathy   HgbA1c 9.7, goal < 7.0  Other Stroke Risk Factors  Advanced age  Cigarette smoker, advised to stop smoking  ETOH use, alcohol level <10, advised to drink no more than 2 drink(s) a day  Hx Substance abuse - UDS:  THC NONE DETECTED, Cocaine NONE DETECTED. Patient advised to stop using due to stroke risk.  Family hx stroke (mother, father, brother, maternal aunt)  Other Active Problems  GERD  Hx prostate cancer  Hospital day # 0 He presented with vision and gait and balance difficulties likely due to small brainstem infarct not visualized on MRI from small vessel disease.  Recommend aspirin and Plavix for 3 weeks followed by aspirin alone and aggressive risk factor modification.  Continue ongoing stroke work-up.  Continue physical occupational therapy.  Discussed with Dr. Louanne Belton.  Greater than 50% time during this 35-minute visit was spent on counseling and coordination of care about his lacunar stroke and answering questions.  Stroke team will sign off.  [facility] follow-up as an outpatient stroke clinic in 6 weeks with Janett Billow my nurse practitioner. Antony Contras, MD To contact Stroke Continuity provider, please refer to [email or web address] After hours, contact General Neurology

## 2020-03-15 DIAGNOSIS — H532 Diplopia: Secondary | ICD-10-CM

## 2020-03-15 DIAGNOSIS — E785 Hyperlipidemia, unspecified: Secondary | ICD-10-CM

## 2020-03-15 LAB — GLUCOSE, CAPILLARY
Glucose-Capillary: 119 mg/dL — ABNORMAL HIGH (ref 70–99)
Glucose-Capillary: 106 mg/dL — ABNORMAL HIGH (ref 70–99)
Glucose-Capillary: 102 mg/dL — ABNORMAL HIGH (ref 70–99)

## 2020-03-15 LAB — BASIC METABOLIC PANEL
Anion gap: 12 (ref 5–15)
BUN: 11 mg/dL (ref 8–23)
CO2: 25 mmol/L (ref 22–32)
Calcium: 9.3 mg/dL (ref 8.9–10.3)
Chloride: 103 mmol/L (ref 98–111)
Creatinine, Ser: 1 mg/dL (ref 0.61–1.24)
GFR calc Af Amer: >60 mL/min (ref >60)
GFR calc non Af Amer: >60 mL/min (ref >60)
Glucose, Bld: 118 mg/dL — ABNORMAL HIGH (ref 70–99)
Potassium: 4 mmol/L (ref 3.5–5.1)
Sodium: 140 mmol/L (ref 135–145)

## 2020-03-15 LAB — MAGNESIUM: Magnesium: 1.9 mg/dL (ref 1.7–2.4)

## 2020-03-15 LAB — CBC
HCT: 41.9 % (ref 39.0–52.0)
Hemoglobin: 13.6 g/dL (ref 13.0–17.0)
MCH: 29.1 pg (ref 26.0–34.0)
MCHC: 32.5 g/dL (ref 30.0–36.0)
MCV: 89.7 fL (ref 80.0–100.0)
Platelets: 303 10*3/uL (ref 150–400)
RBC: 4.67 MIL/uL (ref 4.22–5.81)
RDW: 14 % (ref 11.5–15.5)
WBC: 4.8 10*3/uL (ref 4.0–10.5)
nRBC: 0 % (ref 0.0–0.2)

## 2020-03-15 MED ORDER — CLOPIDOGREL BISULFATE 75 MG PO TABS: 75.0000 mg | ORAL_TABLET | Freq: Every day | ORAL | 0 refills | Status: DC

## 2020-03-15 MED ORDER — ASPIRIN 81 MG PO TBEC: 81.0000 mg | DELAYED_RELEASE_TABLET | Freq: Every day | ORAL | 0 refills | Status: DC

## 2020-03-15 MED ORDER — ASPIRIN 81 MG PO TBEC: 81.0000 mg | DELAYED_RELEASE_TABLET | Freq: Every day | ORAL | 1 refills | Status: AC

## 2020-03-15 NOTE — TOC Transition Note (Addendum)
Transition of Care [facility]) - CM/SW Discharge Note   Patient Details  Name: Theodore Wood MRN: [id number] Date of Birth: March 14, 1951  [facility] CM/SW Contact:  Pollie Friar, RN Phone Number: 03/15/2020, 12:10 PM   Clinical Narrative:    Pt discharging home with [facility] services through [facility]. Drew with [facility] accepted the referral.  3 in 1 to be delivered to the room per [employer]. Pt will have supervision over the weekend from his brother but not during the week as the brother works. Pt refusing any SNF and wants to d/c home.  Brother to provide transport home.  1300 Addendum: CM spoke with [employer] and pt doesn't have Medicare B that would cover the 3 in 1. CM spoke to the patient and he will either get one through the [place] or pay for it out of pocket outside the hospital.   Final next level of care: Home w Home Health Services Barriers to Discharge: No Barriers Identified   Patient Goals and CMS Choice   CMS Medicare.gov Compare Post Acute Care list provided to:: Patient Choice offered to / list presented to : Patient  Discharge Placement                       Discharge Plan and Services                DME Arranged: 3-N-1 DME Agency: [employer] Date DME Agency Contacted: 03/15/20   Representative spoke with at DME Agency: [place]: RN, PT, OT [facility] Agency: [place] Date [place]: 03/15/20   Representative spoke with at [place]: [place] Interventions     Readmission Risk Interventions No flowsheet data found.

## 2020-03-15 NOTE — Discharge Summary (Signed)
Discharge Summary  Theodore Wood NOB:[id number] DOB: [birth year]  PCP: Silvestre Moment, MD  Admit date: 03/13/2020 Discharge date: 03/15/2020  Time spent: 25 minutes  Recommendations for Outpatient Follow-up:  1. New medication: Plavix 75 mg for 21 days.  Patient given a 7-day prescription for now to fill locally and then he will have the remaining 2 weeks mailed to him by the [place] 2. New medication: Aspirin 81 mg p.o. daily   Discharge Diagnoses:  Active Hospital Problems   Diagnosis Date Noted  . CVA (cerebral vascular accident) ([place]) 03/13/2020  . Diplopia 03/14/2020  . GERD (gastroesophageal reflux disease)   . DM (diabetes mellitus) ([place])   . Tobacco abuse 01/11/2016  . Hyperlipidemia 01/11/2016  . HTN (hypertension) 02/03/2012    Resolved Hospital Problems  No resolved problems to display.    Discharge Condition: Improved, being discharged home  Diet recommendation: Carb modified, heart healthy  Vitals:   03/15/20 1259 03/15/20 1300  BP: (!) 193/105 (!) 173/92  Pulse: 78   Resp: 18   Temp: 98.2 F (36.8 C)   SpO2: 100%     History of present illness:  Patient with history of hypertension diabetes mellitus who only recently was started on statin and insulin medications who presents to emergency room after having double vision for several weeks.  He had been told in the past by an eye doctor that his vision was impaired due to elevated sugar so he thought that was the problem.  He presented to the emergency room, he was evaluated.  CT angiogram noted no evidence of significant extracranial stenosis and CT and MRI no definite evidence of acute infarct.  Patient was brought in for further evaluation.  Hospital Course:  Principal Problem: Acute CVA (cerebral vascular accident) [facility]): Neurology felt that patient likely did have a small brainstem infarct not seen on imaging as he had multiple risk factors including uncontrolled diabetes mellitus with an A1c of 9.7,  hyperlipidemia with an LDL of 110.  He was evaluated by speech therapy found no deficits and by PT and OT who recommended no needs by PT, but home health OT and tub bench.  Patient started on aspirin and Plavix, Plavix for 3 weeks, and will follow up with neurology as an outpatient in 6 weeks. Active Problems:   HTN (hypertension): Mildly elevated blood pressure although far out enough that blood pressure can be titrated to goal.    Hyperlipidemia: Patient recently started on statin, advised him to please continue this medication.    Tobacco abuse: Patient counseled to quit.   DM (diabetes mellitus) ([place]): Evidence of poor control with vascular complications.  Advised him to continue his insulin course.  His only been recently started on this in the last month.  His A1c previously had been at 14 a month earlier so for it to now be at nine 1 month later notes significant improvement on medications.   GERD (gastroesophageal reflux disease)   Diplopia: Likely patient symptoms will persist to some degree.  For now, keeping eye patched and he may do well with refractory lenses.  Have advised him to follow-up with his ophthalmologist about   Procedures:  Echocardiogram notes no evidence of thrombus.  Preserved ejection fraction, no valvular dysfunction, no diastolic dysfunction done 4/1  Consultations:  Neurology  Discharge Exam: BP (!) 173/92 Comment: RN Recheck  Pulse 78   Temp 98.2 F (36.8 C) (Oral)   Resp 18   Ht '5\' 6"'  (1.676 m)  SpO2 100%   BMI 26.95 kg/m   General: Alert and oriented x3, no acute distress Cardiovascular: Regular rate and rhythm, S1-S2 Respiratory: Clear to auscultation bilaterally  Discharge Instructions You were cared for by a hospitalist during your hospital stay. If you have any questions about your discharge medications or the care you received while you were in the hospital after you are discharged, you can call the unit and asked to speak with the  hospitalist on call if the hospitalist that took care of you is not available. Once you are discharged, your primary care physician will handle any further medical issues. Please note that NO REFILLS for any discharge medications will be authorized once you are discharged, as it is imperative that you return to your primary care physician (or establish a relationship with a primary care physician if you do not have one) for your aftercare needs so that they can reassess your need for medications and monitor your lab values.  Discharge Instructions    Diet - low sodium heart healthy   Complete by: As directed    Increase activity slowly   Complete by: As directed      Allergies as of 03/15/2020   No Known Allergies     Medication List    TAKE these medications   aspirin 81 MG EC tablet Take 1 tablet (81 mg total) by mouth daily. Start taking on: March 16, 2020   blood glucose meter kit and supplies Kit Dispense based on patient and insurance preference. Use up to four times daily as directed. (FOR ICD-9 250.00, 250.01). What changed:   how much to take  how to take this  when to take this   clopidogrel 75 MG tablet Commonly known as: PLAVIX Take 1 tablet (75 mg total) by mouth daily. Start taking on: March 16, 2020 Notes to patient: You will only take this medicine for a total of 3 weeks   fenofibrate 54 MG tablet Take 1 tablet (54 mg total) by mouth daily.   insulin aspart 100 UNIT/ML injection Commonly known as: NovoLOG Inject 12 Units into the skin 3 (three) times daily before meals.   insulin detemir 100 UNIT/ML FlexPen Commonly known as: LEVEMIR Inject 30 Units into the skin at bedtime.   lisinopril 10 MG tablet Commonly known as: ZESTRIL Take 1 tablet (10 mg total) by mouth daily.   metFORMIN 500 MG tablet Commonly known as: GLUCOPHAGE Take 500 mg by mouth daily before breakfast.   multivitamin with minerals Tabs tablet Take 1 tablet by mouth daily.    nitroGLYCERIN 0.3 MG SL tablet Commonly known as: NITROSTAT Place 1 tablet (0.3 mg total) under the tongue every 5 (five) minutes as needed for chest pain.   pantoprazole 40 MG tablet Commonly known as: PROTONIX Take 1 tablet (40 mg total) by mouth daily.   rosuvastatin 40 MG tablet Commonly known as: CRESTOR Take 1 tablet (40 mg total) by mouth daily at 6 PM.            Durable Medical Equipment  (From admission, onward)         Start     Ordered   03/15/20 1155  For home use only DME Tub bench  Once     03/15/20 1155   03/15/20 1027  For home use only DME 3 n 1  Once     03/15/20 1026         No Known Allergies Follow-up Information    [facility].   Specialty: Home Health Services Why: The home health agency will contact you for the first home visit. Contact information: [place] [address] [phone]        Silvestre Moment, MD Follow up.   Specialty: Internal Medicine Why: Your primary doctor as sson as you can Contact information: [address] [place] [address] [phone]        Frann Rider, NP. Schedule an appointment as soon as possible for a visit in 6 week(s).   Specialty: Neurology Contact information: [address] [place] [address] [phone]            The results of significant diagnostics from this hospitalization (including imaging, microbiology, ancillary and laboratory) are listed below for reference.    Significant Diagnostic Studies: CT ANGIO HEAD W OR WO CONTRAST  Result Date: 03/13/2020 CLINICAL DATA:  Stroke. Vomiting and dizziness. Right-sided weakness. Blurred vision EXAM: CT ANGIOGRAPHY HEAD AND NECK CT PERFUSION BRAIN TECHNIQUE: Multidetector CT imaging of the head and neck was performed using the standard protocol during bolus administration of intravenous contrast. Multiplanar CT image reconstructions and MIPs were obtained to  evaluate the vascular anatomy. Carotid stenosis measurements (when applicable) are obtained utilizing NASCET criteria, using the distal internal carotid diameter as the denominator. Multiphase CT imaging of the brain was performed following IV bolus contrast injection. Subsequent parametric perfusion maps were calculated using RAPID software. CONTRAST:  129m OMNIPAQUE IOHEXOL 350 MG/ML SOLN COMPARISON:  CT head earlier today FINDINGS: CTA NECK FINDINGS Aortic arch: Atherosclerotic aortic arch. Proximal great vessels widely patent. Three-vessel arch. Right carotid system: Right carotid bifurcation widely patent without significant stenosis. Left carotid system: Minimal atherosclerotic calcification left carotid bifurcation without significant stenosis. Vertebral arteries: Both vertebral arteries widely patent without stenosis. Skeleton: Negative Other neck: Negative for mass or adenopathy Upper chest: Lung apices clear bilaterally. Review of the MIP images confirms the above findings CTA HEAD FINDINGS Anterior circulation: Moderate stenosis of the supraclinoid internal carotid artery bilaterally due to atherosclerotic disease. Anterior and middle cerebral arteries patent without large vessel occlusion or significant stenosis. Posterior circulation: Both vertebral arteries patent to the basilar. PICA patent bilaterally. Basilar widely patent. Superior cerebellar and posterior cerebral arteries patent bilaterally without stenosis. Venous sinuses: Normal venous enhancement. Anatomic variants: None Review of the MIP images confirms the above findings CT Brain Perfusion Findings: ASPECTS: 9 CBF (<30%) Volume: 0  mL Perfusion (Tmax>6.0s) volume: 08mMismatch Volume: 58m41mnfarction Location:none IMPRESSION: 1. Negative for emergent large vessel occlusion. 2. Moderate stenosis supraclinoid internal carotid artery bilaterally due to atherosclerotic disease. 3. No significant carotid or vertebral artery stenosis in the neck.  4. CT perfusion negative for infarct or delayed perfusion 5. These results were called by telephone at the time of interpretation on 03/13/2020 at 1:44 pm to provider Lindzen , who verbally acknowledged these results. Electronically Signed   By: ChaFranchot GalloD.   On: 03/13/2020 13:45   CT ANGIO NECK W OR WO CONTRAST  Result Date: 03/13/2020 CLINICAL DATA:  Stroke. Vomiting and dizziness. Right-sided weakness. Blurred vision EXAM: CT ANGIOGRAPHY HEAD AND NECK CT PERFUSION BRAIN TECHNIQUE: Multidetector CT imaging of the head and neck was performed using the standard protocol during bolus administration of intravenous contrast. Multiplanar CT image reconstructions and MIPs were obtained to evaluate the vascular anatomy. Carotid stenosis measurements (when applicable) are obtained utilizing NASCET criteria, using the distal internal carotid diameter as the denominator. Multiphase CT  imaging of the brain was performed following IV bolus contrast injection. Subsequent parametric perfusion maps were calculated using RAPID software. CONTRAST:  164m OMNIPAQUE IOHEXOL 350 MG/ML SOLN COMPARISON:  CT head earlier today FINDINGS: CTA NECK FINDINGS Aortic arch: Atherosclerotic aortic arch. Proximal great vessels widely patent. Three-vessel arch. Right carotid system: Right carotid bifurcation widely patent without significant stenosis. Left carotid system: Minimal atherosclerotic calcification left carotid bifurcation without significant stenosis. Vertebral arteries: Both vertebral arteries widely patent without stenosis. Skeleton: Negative Other neck: Negative for mass or adenopathy Upper chest: Lung apices clear bilaterally. Review of the MIP images confirms the above findings CTA HEAD FINDINGS Anterior circulation: Moderate stenosis of the supraclinoid internal carotid artery bilaterally due to atherosclerotic disease. Anterior and middle cerebral arteries patent without large vessel occlusion or significant stenosis.  Posterior circulation: Both vertebral arteries patent to the basilar. PICA patent bilaterally. Basilar widely patent. Superior cerebellar and posterior cerebral arteries patent bilaterally without stenosis. Venous sinuses: Normal venous enhancement. Anatomic variants: None Review of the MIP images confirms the above findings CT Brain Perfusion Findings: ASPECTS: 9 CBF (<30%) Volume: 0  mL Perfusion (Tmax>6.0s) volume: 041mMismatch Volume: 25m14mnfarction Location:none IMPRESSION: 1. Negative for emergent large vessel occlusion. 2. Moderate stenosis supraclinoid internal carotid artery bilaterally due to atherosclerotic disease. 3. No significant carotid or vertebral artery stenosis in the neck. 4. CT perfusion negative for infarct or delayed perfusion 5. These results were called by telephone at the time of interpretation on 03/13/2020 at 1:44 pm to provider Lindzen , who verbally acknowledged these results. Electronically Signed   By: ChaFranchot GalloD.   On: 03/13/2020 13:45   MR Brain W and Wo Contrast  Result Date: 03/13/2020 CLINICAL DATA:  Ataxia.  Possible stroke.  Monocular vision loss. EXAM: MRI HEAD AND ORBITS WITHOUT AND WITH CONTRAST TECHNIQUE: Multiplanar, multiecho pulse sequences of the brain and surrounding structures were obtained without and with intravenous contrast. Multiplanar, multiecho pulse sequences of the orbits and surrounding structures were obtained including fat saturation techniques, before and after intravenous contrast administration. CONTRAST:  9mL89mDAVIST GADOBUTROL 1 MMOL/ML IV SOLN COMPARISON:  CT studies same day. FINDINGS: MRI HEAD FINDINGS Brain: Diffusion imaging does not show any acute or subacute infarction. There is mild chronic small-vessel change of the pons. No focal cerebellar finding. Cerebral hemispheres show chronic atrophy and gliosis of the inferior frontal low on the right which could be due to an old stroke or old head trauma. Moderate chronic small-vessel  ischemic changes elsewhere affecting the cerebral hemispheric white matter. No mass lesion, hemorrhage, hydrocephalus or extra-axial collection. Vascular: Major vessels at the base of the brain show flow. Skull and upper cervical spine: Negative Other: None MRI ORBITS FINDINGS Orbits: Both globes appear normal. Optic nerves appear normal. Orbital fat is normal. Extra-ocular muscles are normal. Lacrimal glands are normal. Visualized sinuses: No active inflammatory disease. Soft tissues: Negative Limited intracranial: CT brain exam. IMPRESSION: No acute intracranial finding.  No recent stroke. Moderate chronic small-vessel ischemic changes of the pons and cerebral hemispheric white matter. Atrophy and gliosis of the inferior frontal lobe on the right, probably secondary to distant closed head injury. No orbital finding. Electronically Signed   By: MarkNelson Chimes.   On: 03/13/2020 15:56   CT CEREBRAL PERFUSION W CONTRAST  Result Date: 03/13/2020 CLINICAL DATA:  Stroke. Vomiting and dizziness. Right-sided weakness. Blurred vision EXAM: CT ANGIOGRAPHY HEAD AND NECK CT PERFUSION BRAIN TECHNIQUE: Multidetector CT imaging of the head and neck was performed  using the standard protocol during bolus administration of intravenous contrast. Multiplanar CT image reconstructions and MIPs were obtained to evaluate the vascular anatomy. Carotid stenosis measurements (when applicable) are obtained utilizing NASCET criteria, using the distal internal carotid diameter as the denominator. Multiphase CT imaging of the brain was performed following IV bolus contrast injection. Subsequent parametric perfusion maps were calculated using RAPID software. CONTRAST:  182m OMNIPAQUE IOHEXOL 350 MG/ML SOLN COMPARISON:  CT head earlier today FINDINGS: CTA NECK FINDINGS Aortic arch: Atherosclerotic aortic arch. Proximal great vessels widely patent. Three-vessel arch. Right carotid system: Right carotid bifurcation widely patent without  significant stenosis. Left carotid system: Minimal atherosclerotic calcification left carotid bifurcation without significant stenosis. Vertebral arteries: Both vertebral arteries widely patent without stenosis. Skeleton: Negative Other neck: Negative for mass or adenopathy Upper chest: Lung apices clear bilaterally. Review of the MIP images confirms the above findings CTA HEAD FINDINGS Anterior circulation: Moderate stenosis of the supraclinoid internal carotid artery bilaterally due to atherosclerotic disease. Anterior and middle cerebral arteries patent without large vessel occlusion or significant stenosis. Posterior circulation: Both vertebral arteries patent to the basilar. PICA patent bilaterally. Basilar widely patent. Superior cerebellar and posterior cerebral arteries patent bilaterally without stenosis. Venous sinuses: Normal venous enhancement. Anatomic variants: None Review of the MIP images confirms the above findings CT Brain Perfusion Findings: ASPECTS: 9 CBF (<30%) Volume: 0  mL Perfusion (Tmax>6.0s) volume: 011mMismatch Volume: 46m43mnfarction Location:none IMPRESSION: 1. Negative for emergent large vessel occlusion. 2. Moderate stenosis supraclinoid internal carotid artery bilaterally due to atherosclerotic disease. 3. No significant carotid or vertebral artery stenosis in the neck. 4. CT perfusion negative for infarct or delayed perfusion 5. These results were called by telephone at the time of interpretation on 03/13/2020 at 1:44 pm to provider Lindzen , who verbally acknowledged these results. Electronically Signed   By: ChaFranchot GalloD.   On: 03/13/2020 13:45   ECHOCARDIOGRAM COMPLETE  Result Date: 03/14/2020    ECHOCARDIOGRAM REPORT   Patient Name:   GILKRISS ISHLERte of Exam: 03/14/2020 Medical Rec #:  [id number]     Height:       66.0 in Accession #:    [id number]    Weight:       167.0 lb Date of Birth:  5/1Feb 08, 1952     BSA:          1.852 m Patient Age:    [age]          BP:           145/99 mmHg Patient Gender: M                HR:           72 bpm. Exam Location:  Inpatient Procedure: 2D Echo Indications:    Stroke I163.9  History:        Patient has prior history of Echocardiogram examinations, most                 recent 01/14/2016. Risk Factors:Hypertension and Diabetes.  Sonographer:    CasMikki SanteeCS (AE) Referring Phys: [id number] HeinrichHWANI IMPRESSIONS  1. Normal LV systolic function; trace MR and TR.  2. Left ventricular ejection fraction, by estimation, is 60 to 65%. The left ventricle has normal function. The left ventricle has no regional wall motion abnormalities. Left ventricular diastolic parameters were normal.  3. Right ventricular systolic function is normal. The right ventricular size  is normal.  4. The mitral valve is normal in structure. Trivial mitral valve regurgitation. No evidence of mitral stenosis.  5. The aortic valve is tricuspid. Aortic valve regurgitation is not visualized. Mild aortic valve sclerosis is present, with no evidence of aortic valve stenosis.  6. The inferior vena cava is normal in size with greater than 50% respiratory variability, suggesting right atrial pressure of 3 mmHg. FINDINGS  Left Ventricle: Left ventricular ejection fraction, by estimation, is 60 to 65%. The left ventricle has normal function. The left ventricle has no regional wall motion abnormalities. The left ventricular internal cavity size was normal in size. There is  no left ventricular hypertrophy. Left ventricular diastolic parameters were normal. Right Ventricle: The right ventricular size is normal. Right ventricular systolic function is normal. Left Atrium: Left atrial size was normal in size. Right Atrium: Right atrial size was normal in size. Pericardium: There is no evidence of pericardial effusion. Mitral Valve: The mitral valve is normal in structure. Normal mobility of the mitral valve leaflets. Mild mitral annular calcification. Trivial mitral valve  regurgitation. No evidence of mitral valve stenosis. Tricuspid Valve: The tricuspid valve is normal in structure. Tricuspid valve regurgitation is trivial. No evidence of tricuspid stenosis. Aortic Valve: The aortic valve is tricuspid. Aortic valve regurgitation is not visualized. Mild aortic valve sclerosis is present, with no evidence of aortic valve stenosis. Pulmonic Valve: The pulmonic valve was not well visualized. Pulmonic valve regurgitation is not visualized. No evidence of pulmonic stenosis. Aorta: The aortic root is normal in size and structure. Venous: The inferior vena cava is normal in size with greater than 50% respiratory variability, suggesting right atrial pressure of 3 mmHg. IAS/Shunts: No atrial level shunt detected by color flow Doppler. Additional Comments: Normal LV systolic function; trace MR and TR.  LEFT VENTRICLE PLAX 2D LVIDd:         5.20 cm  Diastology LVIDs:         3.80 cm  LV e' lateral:   12.10 cm/s LV PW:         0.90 cm  LV E/e' lateral: 6.1 LV IVS:        0.80 cm  LV e' medial:    7.07 cm/s LVOT diam:     1.90 cm  LV E/e' medial:  10.4 LV SV:         43 LV SV Index:   23 LVOT Area:     2.84 cm  RIGHT VENTRICLE RV S prime:     10.40 cm/s TAPSE (M-mode): 1.6 cm LEFT ATRIUM           Index       RIGHT ATRIUM           Index LA diam:      3.30 cm 1.78 cm/m  RA Area:     12.10 cm LA Vol (A2C): 31.4 ml 16.95 ml/m RA Volume:   25.00 ml  13.50 ml/m LA Vol (A4C): 40.5 ml 21.87 ml/m  AORTIC VALVE LVOT Vmax:   75.10 cm/s LVOT Vmean:  45.000 cm/s LVOT VTI:    0.150 m  AORTA Ao Root diam: 3.00 cm MITRAL VALVE MV Area (PHT): 2.37 cm    SHUNTS MV Decel Time: 320 msec    Systemic VTI:  0.15 m MV E velocity: 73.70 cm/s  Systemic Diam: 1.90 cm MV A velocity: 60.10 cm/s MV E/A ratio:  1.23 Kirk Ruths MD Electronically signed by Kirk Ruths MD Signature Date/Time: 03/14/2020/1:03:24 PM  Final    CT HEAD CODE STROKE WO CONTRAST  Result Date: 03/13/2020 CLINICAL DATA:  Code stroke.  Vomiting dizziness. Right-sided weakness and blurred vision. EXAM: CT HEAD WITHOUT CONTRAST TECHNIQUE: Contiguous axial images were obtained from the base of the skull through the vertex without intravenous contrast. COMPARISON:  None. FINDINGS: Brain: Mild atrophy. Low-density in the right frontal lobe above the orbit. Possible infarct of indeterminate age. Possible vasogenic edema related to mass lesion. Patchy white matter hypodensity bilaterally compatible with chronic microvascular ischemia. Negative for acute hemorrhage. Vascular: Negative for hyperdense vessel Skull: Negative Sinuses/Orbits: Mild mucosal edema paranasal sinuses. Negative orbit Other: None ASPECTS ([place] Stroke Program Early CT Score) - Ganglionic level infarction (caudate, lentiform nuclei, internal capsule, insula, M1-M3 cortex): 6 - Supraganglionic infarction (M4-M6 cortex): 3 Total score (0-10 with 10 being normal): None IMPRESSION: 1. Hypodensity right inferior frontal lobe of indeterminate etiology. No prior studies. Differential includes acute or chronic infarct as well as mass lesion. Recommend MRI brain without with contrast 2. ASPECTS is 9 due to possible infarct in the right frontal lobe. 3. Results texted to Dr. Cheral Marker. Electronically Signed   By: Franchot Gallo M.D.   On: 03/13/2020 13:23   MR ORBITS W WO CONTRAST  Result Date: 03/13/2020 CLINICAL DATA:  Ataxia.  Possible stroke.  Monocular vision loss. EXAM: MRI HEAD AND ORBITS WITHOUT AND WITH CONTRAST TECHNIQUE: Multiplanar, multiecho pulse sequences of the brain and surrounding structures were obtained without and with intravenous contrast. Multiplanar, multiecho pulse sequences of the orbits and surrounding structures were obtained including fat saturation techniques, before and after intravenous contrast administration. CONTRAST:  21m GADAVIST GADOBUTROL 1 MMOL/ML IV SOLN COMPARISON:  CT studies same day. FINDINGS: MRI HEAD FINDINGS Brain: Diffusion imaging does not  show any acute or subacute infarction. There is mild chronic small-vessel change of the pons. No focal cerebellar finding. Cerebral hemispheres show chronic atrophy and gliosis of the inferior frontal low on the right which could be due to an old stroke or old head trauma. Moderate chronic small-vessel ischemic changes elsewhere affecting the cerebral hemispheric white matter. No mass lesion, hemorrhage, hydrocephalus or extra-axial collection. Vascular: Major vessels at the base of the brain show flow. Skull and upper cervical spine: Negative Other: None MRI ORBITS FINDINGS Orbits: Both globes appear normal. Optic nerves appear normal. Orbital fat is normal. Extra-ocular muscles are normal. Lacrimal glands are normal. Visualized sinuses: No active inflammatory disease. Soft tissues: Negative Limited intracranial: CT brain exam. IMPRESSION: No acute intracranial finding.  No recent stroke. Moderate chronic small-vessel ischemic changes of the pons and cerebral hemispheric white matter. Atrophy and gliosis of the inferior frontal lobe on the right, probably secondary to distant closed head injury. No orbital finding. Electronically Signed   By: MNelson ChimesM.D.   On: 03/13/2020 15:56    Microbiology: Recent Results (from the past 240 hour(s))  SARS CORONAVIRUS 2 (TAT 6-24 HRS) Nasopharyngeal Nasopharyngeal Swab     Status: None   Collection Time: 03/13/20  2:16 PM   Specimen: Nasopharyngeal Swab  Result Value Ref Range Status   SARS Coronavirus 2 NEGATIVE NEGATIVE Final    Comment: (NOTE) SARS-CoV-2 target nucleic acids are NOT DETECTED. The SARS-CoV-2 RNA is generally detectable in upper and lower respiratory specimens during the acute phase of infection. Negative results do not preclude SARS-CoV-2 infection, do not rule out co-infections with other pathogens, and should not be used as the sole basis for treatment or other patient management decisions. Negative results must  be combined with  clinical observations, patient history, and epidemiological information. The expected result is Negative. Fact Sheet for Patients: SugarRoll.be Fact Sheet for Healthcare Providers: https://www.woods-mathews.com/ This test is not yet approved or cleared by the [place] FDA and  has been authorized for detection and/or diagnosis of SARS-CoV-2 by FDA under an Emergency Use Authorization (EUA). This EUA will remain  in effect (meaning this test can be used) for the duration of the COVID-19 declaration under Section 56 4(b)(1) of the Act, 21 U.S.C. section 360bbb-3(b)(1), unless the authorization is terminated or revoked sooner. Performed at [facility], [place] [address], [place], [place] [address]      Labs: Basic Metabolic Panel: Recent Labs  Lab 03/13/20 1254 03/13/20 1300 03/14/20 0405 03/15/20 0453  NA 141 143 140 140  K 3.7 3.8 3.7 4.0  CL 106 106 106 103  CO2 22  --  24 25  GLUCOSE 93 95 147* 118*  BUN '11 13 11 11  ' CREATININE 0.90 0.80 0.94 1.00  CALCIUM 9.9  --  9.3 9.3  MG  --   --   --  1.9   Liver Function Tests: Recent Labs  Lab 03/13/20 1254  AST 19  ALT 18  ALKPHOS 68  BILITOT 0.5  PROT 8.0  ALBUMIN 4.2   No results for input(s): LIPASE, AMYLASE in the last 168 hours. No results for input(s): AMMONIA in the last 168 hours. CBC: Recent Labs  Lab 03/13/20 1254 03/13/20 1300 03/14/20 0405 03/15/20 0453  WBC 6.2  --  5.9 4.8  NEUTROABS 2.7  --  3.9  --   HGB 14.7 15.0 13.3 13.6  HCT 45.1 44.0 40.9 41.9  MCV 89.5  --  89.3 89.7  PLT 369  --  330 303   Cardiac Enzymes: No results for input(s): CKTOTAL, CKMB, CKMBINDEX, TROPONINI in the last 168 hours. BNP: BNP (last 3 results) No results for input(s): BNP in the last 8760 hours.  ProBNP (last 3 results) No results for input(s): PROBNP in the last 8760 hours.  CBG: Recent Labs  Lab 03/14/20 1721 03/14/20 2110 03/15/20 0610 03/15/20 0926  03/15/20 1329  GLUCAP 124* 150* 119* 106* 102*       Signed:  Annita Brod, MD [facility] 03/15/2020, 6:02 PM

## 2020-03-15 NOTE — Progress Notes (Signed)
Occupational Therapy Treatment Patient Details Name: COLM HERPEL MRN: [id number] DOB: August 31, 1951 Today's Date: 03/15/2020    History of present illness Pt is a 69 y.o. M with significant PMH of diabetes, HTN, who presents with loss of vision in right eye with associated symptoms of dizziness and gait unsteadiness. CT of the head shows hypodensity right inferior frontal lobe indeterminate etiology. MRI no acute intracranial finding.    OT comments  Pt demonstrated independence in standing bathing, dressing and grooming. Educated in fall prevention and instructed pt not to drive. Recommended neuro-opthamology follow up. Pt prefers to close his R eye vs using occlusion glasses. Taught pt how to occlude his prescription glasses at home and provided transpore tape. Pt verbalizing understanding.  Follow Up Recommendations  Home health OT;Supervision/Assistance - 24 hour    Equipment Recommendations  Tub/shower seat    Recommendations for Other Services      Precautions / Restrictions Precautions Precautions: Fall Precaution Comments: R eye diplopia, has occlusion glasses Restrictions Weight Bearing Restrictions: No       Mobility Bed Mobility                  Transfers Overall transfer level: Independent Equipment used: None                  Balance                                           ADL either performed or assessed with clinical judgement   ADL Overall ADL's : Modified independent                                             Vision   Additional Comments: vision remains unchanged, pt prefers to close R eye rather than use occlusion glasses   Perception     Praxis      Cognition Arousal/Alertness: Awake/alert Behavior During Therapy: WFL for tasks assessed/performed Overall Cognitive Status: Within Functional Limits for tasks assessed                                 General Comments:  educated pt in a fall prevention and to avoid driving given vision deficits        Exercises     Shoulder Instructions       General Comments      Pertinent Vitals/ Pain       Pain Assessment: No/denies pain  Home Living                                          Prior Functioning/Environment              Frequency  Min 3X/week        Progress Toward Goals  OT Goals(current goals can now be found in the care plan section)  Progress towards OT goals: Progressing toward goals  Acute Rehab OT Goals Patient Stated Goal: for vision to improve Time For Goal Achievement: 03/28/20 Potential to Achieve Goals: Good  Plan Discharge plan remains appropriate    Co-evaluation  AM-PAC OT "6 Clicks" Daily Activity     Outcome Measure   Help from another person eating meals?: None Help from another person taking care of personal grooming?: None Help from another person toileting, which includes using toliet, bedpan, or urinal?: None Help from another person bathing (including washing, rinsing, drying)?: None Help from another person to put on and taking off regular upper body clothing?: None Help from another person to put on and taking off regular lower body clothing?: None 6 Click Score: 24    End of Session    OT Visit Diagnosis: Low vision, both eyes (H54.2)   Activity Tolerance Patient tolerated treatment well   Patient Left in chair;with call bell/phone within reach   Nurse Communication          Time: [id number] OT Time Calculation (min): 16 min  Charges: OT General Charges $OT Visit: 1 Visit OT Treatments $Self Care/Home Management : 8-22 mins  Nestor Lewandowsky, OTR/L Acute Rehabilitation Services Pager: [phone] Office: [phone]   Malka So 03/15/2020, 11:38 AM

## 2020-03-15 NOTE — Progress Notes (Signed)
Inpatient Diabetes Program Recommendations  AACE/ADA: New Consensus Statement on Inpatient Glycemic Control   Target Ranges:  Prepandial:   less than 140 mg/dL      Peak postprandial:   less than 180 mg/dL (1-2 hours)      Critically ill patients:  140 - 180 mg/dL   Results for Theodore Wood, Theodore Wood (MRN [id number]) as of 03/15/2020 13:21  Ref. Range 03/14/2020 06:00 03/14/2020 11:47 03/14/2020 17:21 03/14/2020 21:10 03/15/2020 06:10 03/15/2020 09:26  Glucose-Capillary Latest Ref Range: 70 - 99 mg/dL 104 (H) 131 (H) 124 (H) 150 (H) 119 (H) 106 (H)  Results for Theodore Wood, Theodore Wood (MRN [id number]) as of 03/15/2020 13:21  Ref. Range 02/13/2020 19:10 03/14/2020 04:05  Hemoglobin A1C Latest Ref Range: 4.8 - 5.6 % 14.1 (H) 9.7 (H)   Review of Glycemic Control  Diabetes history: DM2 (dx 02/13/20 during last hospital admission) Outpatient Diabetes medications: Levemir 30 units QHS, Novolog 12 units TID with meals, Metformin 500 mg QAM Current orders for Inpatient glycemic control: Novlog 0-15 units TID with meals, Novolog 0-5 units QHS  Inpatient Diabetes Program Recommendations:   HgbA1C: Current A1C 9.7% on 03/14/20 (was 14.1% on 02/13/20) which indicates an average glucose of 232 mg/dl. Patient was newly dx with DM during last hospital admisson 02/13/20-02/15/20 and discharged on Levemir, Novolog, and Metformin.  Noted glucose of 64 mg/dl at 18:06 on 03/13/20 and patient reports glucose has ranged from 71-150 mg/dl since being discharged. Based on glucose trends inpatient, anticipate outpatient insulin dosages will need to be decreased.  NOTE: In reviewing chart, noted patient was admitted 02/13/20-02/15/20 with hyperglycemia and was newly dx with DM at that time. Patient was discharged on 02/15/20 on Levemir 30 units QHS, Novolog 12 units TID with meals.  Noted glucose of 64 mg/dl on 03/13/20 and fasting glucose 104 mg/dl today. No basal insulin given since admitted. Spoke with patient regarding glycemic control after being discharged  on 02/15/20. Patient states that he only had Novolog insulin (was taking Novolog 12 units TID with meals) to take when he left the hospital and [facility] before he started taking that insulin.  Patient reports that he received Levemir about 1 week ago and started taking 30 units at bedtime as directed.  Patient states that he has not taken Metformin because he never received any.  Patient reports that he has been checking his glucose 3 times a day at home and since he was discharged from the hospital, his glucose has ranged from 71-150 mg/dl (even prior to taking the Levemir it was running in this range). Patient reports that he was doing exactly as prescribed from last hospital discharge on 02/15/20. Discussed that currently patient only has Novolog correction ordered and glucose has been trending well with minimal  Discussed current A1C 9.7% on 03/15/20 (was 14.1% on 02/13/20). Reviewed hypoglycemia along with treatment. Patient states he does not know if he had any hypoglycemia because he has never had it before and was not sure what symptoms to look for. Discussed acute danger of hypoglycemia and explained to patient that if he has any hypoglycemia he needs to be sure his PCP is contacted to ask if he needs to make adjustments with DM medications. Asked patient to pay attention to discharge paperwork to see if changes are made with outpatient DM medications. Patient states he has an appointment with PCP at [facility] on May 10th.  Patient verbalized understanding of information discussed and states that he has  no questions at this time related to DM.  Thanks, Barnie Alderman, RN, MSN, CDE Diabetes Coordinator Inpatient Diabetes Program [phone] (Team Pager from 8am to 5pm)

## 2020-03-15 NOTE — Plan of Care (Signed)
Patient stable, discussed POC with patient, agreeable with plan, denies question/concerns at this time.  

## 2020-03-18 ENCOUNTER — Other Ambulatory Visit: Payer: Self-pay | Admitting: *Deleted

## 2020-03-18 NOTE — Patient Outreach (Signed)
[facility]) Care Management  03/18/2020  Theodore Wood 1951/04/24 [id number]   RED ON EMMI ALERT - Stroke Day # 1 Date: 4/4 Red Alert Reason: Problem setting up rehab   Outreach attempt #1, successful, identity verified.  This care manager introduced self and stated purpose of call, [facility] care management services explained.  He report he is doing "real good" since being discharged.  He lives with his brother but state he is independent in ADLs, denies needing assistance with management of health care or with medications. He went to [place] today, state he received his medications, has a limited supply of Plavix, the rest will be delivered.  This care manager inquired about rehab services, he state he does not need it.  Report he is able to walk on his own without the help of people or DME.  State he will start going back to the [facility] to increase his strength.  Denies any further needs from this care manager but he is open to receiving more education regarding stroke recovery.   Plan: RN CM will send EMMI education.  Will close case as no further needs identified.  Theodore Wood, [place], MSN [place] [phone]

## 2020-03-19 ENCOUNTER — Ambulatory Visit: Payer: No Typology Code available for payment source

## 2020-03-25 ENCOUNTER — Other Ambulatory Visit: Payer: Self-pay | Admitting: *Deleted

## 2020-03-25 NOTE — Patient Outreach (Signed)
[place] [facility]) Care Management  03/25/2020  Theodore Wood 1951/08/05 [id number]   RED ON EMMI ALERT - Stroke Day # 6 Date: 4/9 Red Alert Reason: smoked or been around smoke   Outreach attempt #1, unsuccessful.  HIPAA compliant voice message left.   Plan: RN CM will send unsuccessful outreach letter and follow up within the next 3-4 business days.  [name], [place], MSN [place] [phone]

## 2020-03-28 ENCOUNTER — Other Ambulatory Visit: Payer: Self-pay | Admitting: *Deleted

## 2020-03-28 NOTE — Patient Outreach (Signed)
[facility] Care Management  03/28/2020  DANI MIKRUT 01/09/1951 [id number]   RED ON EMMI ALERT - Stroke Day # 6 Date: 4/9 Red Alert Reason: smoked or been around smoke   Outreach attempt #2, unsuccessful.  HIPAA compliant voice message left.   Plan: RN CM will follow up within the next 3-4 business days.  [name], [place], MSN [place] [phone]

## 2020-04-03 ENCOUNTER — Other Ambulatory Visit: Payer: Self-pay | Admitting: *Deleted

## 2020-04-03 NOTE — Patient Outreach (Signed)
[place]) Care Management  04/03/2020  ELLIS COWELL 1951/03/15 [id number]   RED ON EMMI ALERT- Stroke Day #6 Date:4/9 Red Alert Reason:smoked or been around smoke   Outreach attempt #3, unsuccessful. HIPAA compliant voice message left.   Plan: If no call back by 4/26, will close case.  [name], [place], MSN [place] [phone]

## 2020-04-08 ENCOUNTER — Other Ambulatory Visit: Payer: Self-pay | Admitting: *Deleted

## 2020-04-08 NOTE — Patient Outreach (Signed)
[place] [facility]) Care Management  04/08/2020  Theodore Wood October 06, 1951 [id number]   No response from member after multiple unsuccessful outreach attempts and letter sent.  Will close case at this time due to inability to maintain contact.    [name], [place], MSN [place] [phone]

## 2020-04-18 NOTE — Progress Notes (Signed)
NEUROLOGY CONSULTATION NOTE  Theodore Wood MRN: [id number] DOB: 04-Sep-1951  Referring provider: Debby Bud, MD Primary care provider: Geraldine Solar, MD  Reason for consult:  stroke  HISTORY OF PRESENT ILLNESS: Theodore Wood is a 69 year old right-handed black male with diabetes mellitus, HTN, HLD and history of prostate cancer who presents for recent stroke.  History supplemented by hospital and referring provider notes.  He was admitted to [facility] from 03/13/2020 to 03/15/2020 for acute onset of right monocular vision loss, nausea and ataxia.  He was a code stroke but did not receive IV tPA due to outside therapeutic window.  CT head and CT perfusion was negative for acute stroke.  CTA of head and neck showed bilateral moderate supraclinoid ICA stenosis but no large vessel occlusion.  MRI of brain showed no acute stroke but did demonstrate moderate small vessel disease and gliosis in the inferior right frontal lobe related to old brain injury.  MRI of orbits was unremarkable.  2D echo showed EF 60-65% with no source of embolus.  LDL was 110 and Hgb A1c was 9.7.  Small MRI-negative brainstem infarct was suspected.  No antiplatelet therapy prior to admission and he was started on both ASA '81mg'$  and Plavix '75mg'$  daily.  He was continued on Crestor '40mg'$ .  Vision returned within a few days after discharge.  Vision loss was thought to be ischemic from diabetes.    PAST MEDICAL HISTORY: Past Medical History:  Diagnosis Date  . Angina   . Diabetes mellitus without complication ([place])   . GERD (gastroesophageal reflux disease)   . High cholesterol   . Hypertension   . Prostate cancer ([place])     PAST SURGICAL HISTORY: Past Surgical History:  Procedure Laterality Date  . CARDIAC CATHETERIZATION  1990's  . PROSTATE BIOPSY      MEDICATIONS: Current Outpatient Medications on File Prior to Visit  Medication Sig Dispense Refill  . aspirin 81 MG EC tablet Take 1 tablet (81 mg  total) by mouth daily. 30 tablet 1  . blood glucose meter kit and supplies KIT Dispense based on patient and insurance preference. Use up to four times daily as directed. (FOR ICD-9 250.00, 250.01). (Patient taking differently: 1 each by Other route See admin instructions. Dispense based on patient and insurance preference. Use up to four times daily as directed. (FOR ICD-9 250.00, 250.01).) 1 each 0  . clopidogrel (PLAVIX) 75 MG tablet Take 1 tablet (75 mg total) by mouth daily. 14 tablet 0  . fenofibrate 54 MG tablet Take 1 tablet (54 mg total) by mouth daily. 30 tablet 0  . insulin aspart (NOVOLOG) 100 UNIT/ML injection Inject 12 Units into the skin 3 (three) times daily before meals. 10.8 mL 0  . insulin detemir (LEVEMIR) 100 UNIT/ML FlexPen Inject 30 Units into the skin at bedtime. 15 mL 11  . lisinopril (ZESTRIL) 10 MG tablet Take 1 tablet (10 mg total) by mouth daily. 30 tablet 0  . metFORMIN (GLUCOPHAGE) 500 MG tablet Take 500 mg by mouth daily before breakfast.    . Multiple Vitamin (MULTIVITAMIN WITH MINERALS) TABS tablet Take 1 tablet by mouth daily. (Patient not taking: Reported on 12/30/2018) 90 tablet 4  . nitroGLYCERIN (NITROSTAT) 0.3 MG SL tablet Place 1 tablet (0.3 mg total) under the tongue every 5 (five) minutes as needed for chest pain. 30 tablet 12  . pantoprazole (PROTONIX) 40 MG tablet Take 1 tablet (40 mg total) by mouth daily. (Patient not taking:  Reported on 12/30/2018) 90 tablet 0  . rosuvastatin (CRESTOR) 40 MG tablet Take 1 tablet (40 mg total) by mouth daily at 6 PM. (Patient not taking: Reported on 12/30/2018) 90 tablet 4   No current facility-administered medications on file prior to visit.    ALLERGIES: No Known Allergies  FAMILY HISTORY: Family History  Problem Relation Age of Onset  . Stroke Mother   . Stroke Father   . Stroke Brother   . Cancer Maternal Aunt        unknown type   SOCIAL HISTORY: Social History   Socioeconomic History  . Marital status:  Legally Separated    Spouse name: Not on file  . Number of children: 4  . Years of education: Not on file  . Highest education level: Not on file  Occupational History    Comment: retired  Tobacco Use  . Smoking status: Former Smoker    Packs/day: 0.25    Years: 30.00    Pack years: 7.50    Types: Cigarettes  . Smokeless tobacco: Never Used  Substance and Sexual Activity  . Alcohol use: Yes    Alcohol/week: 42.0 standard drinks    Types: 42 Cans of beer per week    Comment: 4 X  a week "a couple of beers"  . Drug use: Yes    Types: Cocaine, Marijuana, Heroin    Comment: 02/03/12 "3 days ago I did cocaine to get the pain off; used heroin years ago" 08/28/2014 " I stopped using all that"  . Sexual activity: Yes    Birth control/protection: Condom  Other Topics Concern  . Not on file  Social History Narrative   [place] [employer] for [place]. Separated from wife.    Social Determinants of Health   Financial Resource Strain:   . Difficulty of Paying Living Expenses:   Food Insecurity:   . Worried About Charity fundraiser in the Last Year:   . Arboriculturist in the Last Year:   Transportation Needs:   . Film/video editor (Medical):   Marland Kitchen Lack of Transportation (Non-Medical):   Physical Activity:   . Days of Exercise per Week:   . Minutes of Exercise per Session:   Stress:   . Feeling of Stress :   Social Connections:   . Frequency of Communication with Friends and Family:   . Frequency of Social Gatherings with Friends and Family:   . Attends Religious Services:   . Active Member of Clubs or Organizations:   . Attends Archivist Meetings:   Marland Kitchen Marital Status:   Intimate Partner Violence:   . Fear of Current or Ex-Partner:   . Emotionally Abused:   Marland Kitchen Physically Abused:   . Sexually Abused:     PHYSICAL EXAM: Blood pressure 126/75, pulse 94, resp. rate 20, height _0  (1.676 m), weight 162 lb (73.5 kg), SpO2 97 %. General: No acute distress.  Patient  appears well-groomed.  Head:  Normocephalic/atraumatic Eyes:  fundi examined but not visualized Neck: supple, no paraspinal tenderness, full range of motion Back: No paraspinal tenderness Heart: regular rate and rhythm Lungs: Clear to auscultation bilaterally. Vascular: No carotid bruits. Neurological Exam: Mental status: alert and oriented to person, place, and time, recent and remote memory intact, fund of knowledge intact, attention and concentration intact, speech fluent and not dysarthric, language intact. Cranial nerves: CN I: not tested CN II: pupils equal, round and reactive to light, visual fields intact CN III, IV, VI:  full range of motion, no nystagmus, no ptosis CN V: facial sensation intact CN VII: upper and lower face symmetric CN VIII: hearing intact CN IX, X: gag intact, uvula midline CN XI: sternocleidomastoid and trapezius muscles intact CN XII: tongue midline Bulk & Tone: normal, no fasciculations. Motor:  5/5 throughout  Sensation:  Pinprick sensation reduced in feet and fingers; and vibration sensation reduced in feet. Deep Tendon Reflexes:  2+ throughout, toes downgoing.  Finger to nose testing:  Without dysmetria.  Heel to shin:  Without dysmetria.  Gait:  Normal station and stride.  Able to turn and tandem walk. Romberg negative.  IMPRESSION: 1.  Possible MRI-negative brainstem infarct secondary to small vessel disease 2.  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  Type 2 diabetes mellitus, uncontrolled 4.  Hypertension 5.  Hyperlipidemia  PLAN: Secondary stroke prevention as per PCP: 1.  Stop Plavix.  Continue ASA 13m daily for secondary stroke prevention. 2.  Statin therapy (LDL goal less than 70) 3.  Optimize glycemic control 4.  Continue blood pressure control 5.  Diabetic heart-healthy diet 6.  Routine exercise 7.  Follow up in 4 months.  Thank you for allowing me to take part in the care of this patient.   AMetta Clines DO  CC:  SGeraldine Solar MD  DDebby Bud MD

## 2020-04-19 ENCOUNTER — Ambulatory Visit (INDEPENDENT_AMBULATORY_CARE_PROVIDER_SITE_OTHER): Payer: No Typology Code available for payment source | Admitting: Neurology

## 2020-04-19 ENCOUNTER — Other Ambulatory Visit: Payer: Self-pay

## 2020-04-19 ENCOUNTER — Encounter: Payer: Self-pay | Admitting: Neurology

## 2020-04-19 VITALS — BP 126/75 | HR 94 | Resp 20 | Ht 66.0 in | Wt 162.0 lb

## 2020-04-19 DIAGNOSIS — E1110 Type 2 diabetes mellitus with ketoacidosis without coma: Secondary | ICD-10-CM

## 2020-04-19 DIAGNOSIS — I6359 Cerebral infarction due to unspecified occlusion or stenosis of other cerebral artery: Secondary | ICD-10-CM

## 2020-04-19 DIAGNOSIS — I10 Essential (primary) hypertension: Secondary | ICD-10-CM

## 2020-04-19 DIAGNOSIS — E785 Hyperlipidemia, unspecified: Secondary | ICD-10-CM

## 2020-04-19 DIAGNOSIS — H47011 Ischemic optic neuropathy, right eye: Secondary | ICD-10-CM

## 2020-04-19 NOTE — Patient Instructions (Signed)
1.  Continue aspirin 81mg  daily.  Stop clopidogrel. 2.  Continue Crestor and blood pressure medication 3.  Continue working on getting diabetes under control 4.  Follow diabetic diet and exercise 5.  Follow up in 4 months.

## 2020-05-20 ENCOUNTER — Encounter: Payer: Self-pay | Admitting: Gastroenterology

## 2020-05-21 ENCOUNTER — Telehealth: Payer: Self-pay

## 2020-05-21 NOTE — Telephone Encounter (Signed)
I spoke with the patient and he confirmed that the colonoscopy was for screening and he was not having any problems. Colonoscopy and Pre-Visit were cancelled and patient was placed in recall for six months. I explained to the patient that he would need to have an office visit prior to his procedure per Dr. Rush Landmark. Patient verbalizes understanding and agrees with the plan of action.   Riki Sheer, LPN

## 2020-05-21 NOTE — Telephone Encounter (Signed)
Dr. Rush Landmark and Jenny Reichmann,  I was preparing a chart for this patient who is a direct screening from the [place]. This patient had a stroke on 03/13/20. He was hospitalized until 03/15/20. The patient was initially placed on Plavix but was taken off of this medication on 04/19/20. I called the patient and he did confirm that he no longer takes Plavix, just a baby aspirin. The patient is scheduled for PV on 05/29/20 and a colonoscopy on 06/10/20. Is this too soon to have a procedure? Or is the patient ok to have his procedure as scheduled?  Please advise. Thanks!   Riki Sheer, LPN, Pre-Visit.

## 2020-05-21 NOTE — Telephone Encounter (Signed)
Thanks for checking. Too soon. Needs a clinic appointment first based on this. If this is for screening procedure, would likely wait 6-12 months before any elective procedure with recent stroke. If the referral is for an urgent need or bleeding, then we will have to see. Please cancel his direct procedure. Thanks. GM

## 2020-05-30 NOTE — Telephone Encounter (Signed)
I received a CT abdomen/pelvis performed in March 2021 at the [place] from [facility]  The reason for the study was a reported history of blood in stool for 1-1/2 months and a history of prostate cancer with prostatectomy in 2019.  The impression of the study was such that there was a possible filling defect along the left common femoral vein with consideration of a dedicated DVT Doppler ultrasound to be performed.  There is no focal small bowel dilation.  There was moderate to heavy stool burden in the colon with scattered colonic diverticulosis.  Visualized lung bases demonstrated bibasilar linear groundglass opacities with a 6 mm groundglass nodule in the right base with consideration for CT chest. It looks like these findings were sent to the ordering provider.   We had thought that this was a patient who was being sent to [place] for screening procedures and in the setting of recent stroke we felt we would need to wait. I would like to see the patient in clinic to discuss with him his history so that we can consider if we truly can wait for 6 to 12 months from his stroke or if we need to do something sooner.  Please proceed with scheduling clinic visit within the next few weeks for discussion.  He can be evaluated by one of the APP's as well and laboratories can be obtained including a CBC/iron/TIBC/ferritin to have a better sense of what his stability is and see if we truly can wait that long for a procedure or not.  I will have these records scanned into the chart and we will work on getting him scheduled for a clinic visit.

## 2020-06-03 NOTE — Telephone Encounter (Signed)
Thank you for the update Rovonda. Please make sure we let the referring [place] [facility] providers know of the patient's desires and that he has not scheduled any procedures.  He needs to be seen in clinic, so maybe a follow up call in 60-month to see if we can get him in clinic, but let the [place] know of the patient's wishes. Thank you. GM

## 2020-06-03 NOTE — Telephone Encounter (Signed)
Dr.Mansouraty , I called to get pt scheduled for clinic visit . He states that he needs to see Urologist first and does not wish to schedule office visit to discuss scheduling colonoscopy at this time.

## 2020-06-10 ENCOUNTER — Encounter: Payer: No Typology Code available for payment source | Admitting: Gastroenterology

## 2020-06-12 ENCOUNTER — Encounter (HOSPITAL_COMMUNITY): Payer: Self-pay | Admitting: Emergency Medicine

## 2020-06-12 ENCOUNTER — Emergency Department (HOSPITAL_COMMUNITY)
Admission: EM | Admit: 2020-06-12 | Discharge: 2020-06-12 | Disposition: A | Discharge status: Home / Self Care | Payer: No Typology Code available for payment source | Attending: Emergency Medicine | Admitting: Emergency Medicine

## 2020-06-12 DIAGNOSIS — Z5321 Procedure and treatment not carried out due to patient leaving prior to being seen by health care provider: Secondary | ICD-10-CM | POA: Diagnosis not present

## 2020-06-12 DIAGNOSIS — R319 Hematuria, unspecified: Secondary | ICD-10-CM | POA: Insufficient documentation

## 2020-06-12 LAB — URINALYSIS, ROUTINE W REFLEX MICROSCOPIC
Bilirubin Urine: NEGATIVE
Glucose, UA: NEGATIVE mg/dL
Ketones, ur: NEGATIVE mg/dL
Leukocytes,Ua: NEGATIVE
Nitrite: NEGATIVE
Protein, ur: 100 mg/dL — AB
RBC / HPF: >50 RBC/hpf — ABNORMAL HIGH (ref 0–5)
Specific Gravity, Urine: 1.018 (ref 1.005–1.030)
pH: 5 (ref 5.0–8.0)

## 2020-06-12 NOTE — ED Notes (Signed)
Called pt x2 for vital recheck, no response.

## 2020-06-12 NOTE — ED Triage Notes (Signed)
Pt. Stated, Im having blood in my urine and hurt to urine. Its been going on for 3 weeks.

## 2020-06-12 NOTE — ED Notes (Signed)
Pt decided to the the emergency department

## 2020-06-13 ENCOUNTER — Other Ambulatory Visit: Payer: Self-pay

## 2020-06-24 ENCOUNTER — Emergency Department (HOSPITAL_COMMUNITY): Payer: No Typology Code available for payment source

## 2020-06-24 ENCOUNTER — Emergency Department (HOSPITAL_COMMUNITY)
Admission: EM | Admit: 2020-06-24 | Discharge: 2020-06-24 | Disposition: A | Discharge status: Home / Self Care | Payer: No Typology Code available for payment source | Attending: Emergency Medicine | Admitting: Emergency Medicine

## 2020-06-24 ENCOUNTER — Encounter (HOSPITAL_COMMUNITY): Payer: Self-pay

## 2020-06-24 ENCOUNTER — Other Ambulatory Visit: Payer: Self-pay

## 2020-06-24 DIAGNOSIS — R079 Chest pain, unspecified: Secondary | ICD-10-CM | POA: Insufficient documentation

## 2020-06-24 DIAGNOSIS — R634 Abnormal weight loss: Secondary | ICD-10-CM | POA: Diagnosis not present

## 2020-06-24 DIAGNOSIS — I10 Essential (primary) hypertension: Secondary | ICD-10-CM | POA: Diagnosis not present

## 2020-06-24 DIAGNOSIS — R319 Hematuria, unspecified: Secondary | ICD-10-CM

## 2020-06-24 DIAGNOSIS — Z8546 Personal history of malignant neoplasm of prostate: Secondary | ICD-10-CM | POA: Insufficient documentation

## 2020-06-24 DIAGNOSIS — Z87891 Personal history of nicotine dependence: Secondary | ICD-10-CM | POA: Insufficient documentation

## 2020-06-24 DIAGNOSIS — F1210 Cannabis abuse, uncomplicated: Secondary | ICD-10-CM | POA: Diagnosis not present

## 2020-06-24 DIAGNOSIS — R300 Dysuria: Secondary | ICD-10-CM | POA: Diagnosis not present

## 2020-06-24 DIAGNOSIS — E119 Type 2 diabetes mellitus without complications: Secondary | ICD-10-CM | POA: Insufficient documentation

## 2020-06-24 DIAGNOSIS — F1110 Opioid abuse, uncomplicated: Secondary | ICD-10-CM | POA: Insufficient documentation

## 2020-06-24 DIAGNOSIS — Z7984 Long term (current) use of oral hypoglycemic drugs: Secondary | ICD-10-CM | POA: Insufficient documentation

## 2020-06-24 LAB — TROPONIN I (HIGH SENSITIVITY)
Troponin I (High Sensitivity): 4 ng/L (ref <18)
Troponin I (High Sensitivity): 4 ng/L (ref <18)

## 2020-06-24 LAB — CBC
HCT: 40.2 % (ref 39.0–52.0)
Hemoglobin: 13.2 g/dL (ref 13.0–17.0)
MCH: 27.9 pg (ref 26.0–34.0)
MCHC: 32.8 g/dL (ref 30.0–36.0)
MCV: 85 fL (ref 80.0–100.0)
Platelets: 372 10*3/uL (ref 150–400)
RBC: 4.73 MIL/uL (ref 4.22–5.81)
RDW: 14 % (ref 11.5–15.5)
WBC: 8.5 10*3/uL (ref 4.0–10.5)
nRBC: 0 % (ref 0.0–0.2)

## 2020-06-24 LAB — URINALYSIS, MICROSCOPIC (REFLEX): RBC / HPF: >50 RBC/hpf (ref 0–5)

## 2020-06-24 LAB — BASIC METABOLIC PANEL
Anion gap: 14 (ref 5–15)
BUN: 19 mg/dL (ref 8–23)
CO2: 23 mmol/L (ref 22–32)
Calcium: 9.7 mg/dL (ref 8.9–10.3)
Chloride: 100 mmol/L (ref 98–111)
Creatinine, Ser: 0.99 mg/dL (ref 0.61–1.24)
GFR calc Af Amer: >60 mL/min (ref >60)
GFR calc non Af Amer: >60 mL/min (ref >60)
Glucose, Bld: 78 mg/dL (ref 70–99)
Potassium: 3.8 mmol/L (ref 3.5–5.1)
Sodium: 137 mmol/L (ref 135–145)

## 2020-06-24 LAB — URINALYSIS, ROUTINE W REFLEX MICROSCOPIC

## 2020-06-24 LAB — CBG MONITORING, ED: Glucose-Capillary: 106 mg/dL — ABNORMAL HIGH (ref 70–99)

## 2020-06-24 MED ORDER — CIPROFLOXACIN HCL 500 MG PO TABS: 500.0000 mg | ORAL_TABLET | Freq: Two times a day (BID) | ORAL | 0 refills | Status: DC

## 2020-06-24 MED ORDER — SODIUM CHLORIDE 0.9 % IV BOLUS
500.0000 mL | Freq: Once | INTRAVENOUS | Status: AC
  Administered 2020-06-24: 500 mL via INTRAVENOUS

## 2020-06-24 MED ORDER — IOHEXOL 300 MG/ML  SOLN
100.0000 mL | Freq: Once | INTRAMUSCULAR | Status: AC | PRN
  Administered 2020-06-24: 100 mL via INTRAVENOUS

## 2020-06-24 MED ORDER — CIPROFLOXACIN IN D5W 400 MG/200ML IV SOLN
400.0000 mg | Freq: Once | INTRAVENOUS | Status: AC
  Administered 2020-06-24: 400 mg via INTRAVENOUS
  Filled 2020-06-24: qty 200

## 2020-06-24 NOTE — ED Notes (Signed)
Pt in CT.   Will return to assess pt.

## 2020-06-24 NOTE — ED Provider Notes (Signed)
[facility] EMERGENCY DEPARTMENT Provider Note   CSN: [id number] Arrival date & time: 06/24/20  1012     History Chief Complaint  Patient presents with  . Chest Pain  . Hematuria    Theodore Wood is a 69 y.o. male.  HPI    Patient presents with hematuria, weight loss, and chest pain. He notes that he is generally well.  About 1 month ago he started having intermittent hematuria. Now over the past 2 days he has had more persistent hematuria, including presence of thick clot-like red material with urination and mild dysuria. No scrotal symptoms, no substantial abdominal pain, no nausea, vomiting, fever. Patient has had weight loss, approximately 6 or 7 pounds over the past month as well.  Today he had an episode of chest pain as well, though this has resolved. Describes it as brief, uncomfortable, without clear precipitant, for any obvious alleviating factors.   Past Medical History:  Diagnosis Date  . Angina   . Diabetes mellitus without complication ([place])   . GERD (gastroesophageal reflux disease)   . High cholesterol   . Hypertension   . Prostate cancer [facility])     Patient Active Problem List   Diagnosis Date Noted  . Diplopia 03/14/2020  . CVA (cerebral vascular accident) ([place]) 03/13/2020  . GERD (gastroesophageal reflux disease)   . Diabetic ketoacidosis without coma associated with type 2 diabetes mellitus ([place])   . DM (diabetes mellitus) ([place])   . Severe hyperglycemia due to diabetes mellitus ([place]) 02/13/2020  . Hypertriglyceridemia 02/13/2020  . Malignant neoplasm of prostate ([place]) 12/12/2018  . Abnormal cardiac function test 01/14/2016  . Abnormal EKG   . Hyperlipidemia 01/11/2016  . Tobacco abuse 01/11/2016  . Epigastric pain 01/11/2016  . Gynecomastia, male 01/11/2016  . AKI (acute kidney injury) ([place]) 01/11/2016  . Atypical chest pain 01/11/2016  . Statin myopathy 02/04/2012  . Chest pain 02/03/2012  . Rhabdomyolysis 02/03/2012   . HTN (hypertension) 02/03/2012  . Alcohol abuse 02/03/2012  . Cocaine abuse ([place]) 02/03/2012    Past Surgical History:  Procedure Laterality Date  . CARDIAC CATHETERIZATION  1990's  . PROSTATE BIOPSY         Family History  Problem Relation Age of Onset  . Stroke Mother   . Stroke Father   . Stroke Brother   . Cancer Maternal Aunt        unknown type    Social History   Tobacco Use  . Smoking status: Former Smoker    Packs/day: 0.25    Years: 30.00    Pack years: 7.50    Types: Cigarettes  . Smokeless tobacco: Never Used  Vaping Use  . Vaping Use: Never used  Substance Use Topics  . Alcohol use: Not Currently    Alcohol/week: 42.0 standard drinks    Types: 42 Cans of beer per week    Comment: 4 X  a week "a couple of beers"  . Drug use: Yes    Types: Marijuana, Heroin    Comment: 02/03/12 "3 days ago I did cocaine to get the pain off; used heroin years ago" 08/28/2014 " I stopped using all that"    Home Medications Prior to Admission medications   Medication Sig Start Date End Date Taking? Authorizing Provider  fenofibrate 54 MG tablet Take 1 tablet (54 mg total) by mouth daily. 02/15/20 06/24/20 Yes Marianna Payment, MD  fluticasone (FLONASE) 50 MCG/ACT nasal spray Place 1 spray into both nostrils daily.  Yes [provider]  hydroxypropyl methylcellulose / hypromellose (ISOPTO TEARS / GONIOVISC) 2.5 % ophthalmic solution Place 1 drop into both eyes daily as needed for dry eyes.   Yes [provider]  insulin aspart (NOVOLOG) 100 UNIT/ML injection Inject 12 Units into the skin 3 (three) times daily before meals. Patient taking differently: Inject 10 Units into the skin daily.  02/15/20 06/24/20 Yes Marianna Payment, MD  lisinopril (ZESTRIL) 10 MG tablet Take 1 tablet (10 mg total) by mouth daily. 02/16/20 06/24/20 Yes Marianna Payment, MD  metFORMIN (GLUCOPHAGE) 500 MG tablet Take 1,000 mg by mouth 2 (two) times daily with a meal.    Yes [provider]   nitroGLYCERIN (NITROSTAT) 0.3 MG SL tablet Place 1 tablet (0.3 mg total) under the tongue every 5 (five) minutes as needed for chest pain. 01/13/16  Yes [name], MD  pantoprazole (PROTONIX) 40 MG tablet Take 1 tablet (40 mg total) by mouth daily. Patient taking differently: Take 40 mg by mouth daily as needed (heart burn).  01/13/16  Yes [name], MD  aspirin 81 MG EC tablet Take 1 tablet (81 mg total) by mouth daily. Patient not taking: Reported on 06/24/2020 03/16/20   Annita Brod, MD  blood glucose meter kit and supplies KIT Dispense based on patient and insurance preference. Use up to four times daily as directed. (FOR ICD-9 250.00, 250.01). Patient taking differently: 1 each by Other route See admin instructions. Dispense based on patient and insurance preference. Use up to four times daily as directed. (FOR ICD-9 250.00, 250.01). 02/14/20   Marianna Payment, MD  ciprofloxacin (CIPRO) 500 MG tablet Take 1 tablet (500 mg total) by mouth 2 (two) times daily. 06/24/20   Carmin Muskrat, MD  insulin detemir (LEVEMIR) 100 UNIT/ML FlexPen Inject 30 Units into the skin at bedtime. Patient not taking: Reported on 06/24/2020 02/15/20   Marianna Payment, MD  Multiple Vitamin (MULTIVITAMIN WITH MINERALS) TABS tablet Take 1 tablet by mouth daily. Patient not taking: Reported on 06/24/2020 01/13/16   [name], MD  rosuvastatin (CRESTOR) 40 MG tablet Take 1 tablet (40 mg total) by mouth daily at 6 PM. Patient not taking: Reported on 06/24/2020 01/13/16   [name], MD    Allergies    Patient has no known allergies.  Review of Systems   Review of Systems  Constitutional:       Per HPI, otherwise negative  HENT:       Per HPI, otherwise negative  Respiratory:       Per HPI, otherwise negative  Cardiovascular:       Per HPI, otherwise negative  Gastrointestinal: Negative for vomiting.  Endocrine:       Negative aside from HPI  Genitourinary:       Neg aside from HPI   Musculoskeletal:        Per HPI, otherwise negative  Skin: Negative.   Neurological: Negative for syncope.    Physical Exam Updated Vital Signs BP (!) 162/82   Pulse 86   Temp 98.7 F (37.1 C)   Resp 20   Ht _0  (1.676 m)   Wt 68.5 kg   SpO2 99%   BMI 24.37 kg/m   Physical Exam Vitals and nursing note reviewed.  Constitutional:      General: He is not in acute distress.    Appearance: He is well-developed.  HENT:     Head: Normocephalic and atraumatic.  Eyes:     Conjunctiva/sclera: Conjunctivae normal.  Cardiovascular:     Rate and Rhythm: Normal rate and regular rhythm.  Pulmonary:     Effort: Pulmonary effort is normal. No respiratory distress.     Breath sounds: No stridor.  Abdominal:     General: There is no distension.     Palpations: Abdomen is soft.     Tenderness: There is no abdominal tenderness. There is no guarding.  Skin:    General: Skin is warm and dry.  Neurological:     Mental Status: He is alert and oriented to person, place, and time.      ED Results / Procedures / Treatments   Labs (all labs ordered are listed, but only abnormal results are displayed) Labs Reviewed  URINALYSIS, ROUTINE W REFLEX MICROSCOPIC - Abnormal; Notable for the following components:      Result Value   Color, Urine RED (*)    APPearance TURBID (*)    Glucose, UA   (*)    Value: TEST NOT REPORTED DUE TO COLOR INTERFERENCE OF URINE PIGMENT   Hgb urine dipstick   (*)    Value: TEST NOT REPORTED DUE TO COLOR INTERFERENCE OF URINE PIGMENT   Bilirubin Urine   (*)    Value: TEST NOT REPORTED DUE TO COLOR INTERFERENCE OF URINE PIGMENT   Ketones, ur   (*)    Value: TEST NOT REPORTED DUE TO COLOR INTERFERENCE OF URINE PIGMENT   Protein, ur   (*)    Value: TEST NOT REPORTED DUE TO COLOR INTERFERENCE OF URINE PIGMENT   Nitrite   (*)    Value: TEST NOT REPORTED DUE TO COLOR INTERFERENCE OF URINE PIGMENT   Leukocytes,Ua   (*)    Value: TEST NOT REPORTED DUE TO COLOR INTERFERENCE OF URINE  PIGMENT   All other components within normal limits  URINALYSIS, MICROSCOPIC (REFLEX) - Abnormal; Notable for the following components:   Bacteria, UA MANY (*)    All other components within normal limits  CBG MONITORING, ED - Abnormal; Notable for the following components:   Glucose-Capillary 106 (*)    All other components within normal limits  BASIC METABOLIC PANEL  CBC  TROPONIN I (HIGH SENSITIVITY)  TROPONIN I (HIGH SENSITIVITY)    EKG EKG Interpretation  Date/Time:  Monday June 24 2020 10:23:21 EDT Ventricular Rate:  97 PR Interval:  156 QRS Duration: 86 QT Interval:  326 QTC Calculation: 414 R Axis:   43 Text Interpretation: Sinus rhythm with occasional Premature ventricular complexes Nonspecific ST and T wave abnormality No significant change since last tracing Abnormal ECG Confirmed by Carmin Muskrat [phone]) on 06/24/2020 4:40:26 PM   Radiology DG Chest 2 View  Result Date: 06/24/2020 CLINICAL DATA:  Chest pain. EXAM: CHEST - 2 VIEW COMPARISON:  September 02, 2018. FINDINGS: The heart size and mediastinal contours are within normal limits. Both lungs are clear. No pneumothorax or pleural effusion is noted. The visualized skeletal structures are unremarkable. IMPRESSION: No active cardiopulmonary disease. Aortic Atherosclerosis (ICD10-I70.0). Electronically Signed   By: Marijo Conception M.D.   On: 06/24/2020 10:46   CT ABDOMEN PELVIS W CONTRAST  Result Date: 06/24/2020 CLINICAL DATA:  68 year old male with hematuria. EXAM: CT ABDOMEN AND PELVIS WITH CONTRAST TECHNIQUE: Multidetector CT imaging of the abdomen and pelvis was performed using the standard protocol following bolus administration of intravenous contrast. CONTRAST:  149m OMNIPAQUE IOHEXOL 300 MG/ML  SOLN COMPARISON:  None. FINDINGS: Lower chest: The visualized lung bases are clear. No intra-abdominal free air or free fluid.  Hepatobiliary: The liver is unremarkable. No intrahepatic biliary ductal dilatation. The  gallbladder is unremarkable. Pancreas: Unremarkable. No pancreatic ductal dilatation or surrounding inflammatory changes. Spleen: Normal in size without focal abnormality. Adrenals/Urinary Tract: The adrenal glands unremarkable. There is no hydronephrosis on either side. There is symmetric enhancement and excretion of contrast by both kidneys. The visualized ureters appear unremarkable. There is diffuse irregular thickening of the bladder wall with mild perivesical stranding and edema. Although this may represent cystitis, findings concerning for an infiltrative process. Further evaluation with cystoscopy after resolution of acute symptoms and inflammation recommended. Stomach/Bowel: There are small scattered colonic diverticula without active inflammatory changes. There is no bowel obstruction or active inflammation. The appendix is normal. Vascular/Lymphatic: Mild aortoiliac atherosclerotic disease. The IVC is unremarkable. No portal venous gas. There is no adenopathy. Reproductive: The prostate gland is enlarged measuring 5.3 cm in transverse axial diameter. There is heterogeneity and haziness of the prostate and seminal vesicles which may be related to inflammatory changes of the urinary bladder or represent prostatitis. Ill-defined areas of lower attenuation within the central gland may represent focal edema. Other: Small fat containing umbilical hernia. Musculoskeletal: Degenerative changes with disc desiccation and vacuum phenomena at L4-L5. No acute osseous pathology. IMPRESSION: 1. Diffuse irregular thickening of the bladder wall may represent cystitis but concerning for an infiltrative process. Correlation with urinalysis and further follow-up evaluation with cystoscopy after resolution of acute symptoms and inflammation recommended. 2. Mildly enlarged prostate gland with inflammatory changes which may be reactive to cystitis or represent prostatitis. 3. Colonic diverticulosis. No bowel obstruction. Normal  appendix. 4. Aortic Atherosclerosis (ICD10-I70.0). Electronically Signed   By: Anner Crete M.D.   On: 06/24/2020 19:25    Procedures Procedures (including critical care time)  Medications Ordered in ED Medications  sodium chloride 0.9 % bolus 500 mL (0 mLs Intravenous Stopped 06/24/20 2042)  iohexol (OMNIPAQUE) 300 MG/ML solution 100 mL (100 mLs Intravenous Contrast Given 06/24/20 1855)  ciprofloxacin (CIPRO) IVPB 400 mg (0 mg Intravenous Stopped 06/24/20 2156)    ED Course  I have reviewed the triage vital signs and the nursing notes.  Pertinent labs & imaging results that were available during my care of the patient were reviewed by me and considered in my medical decision making (see chart for details).   Elderly male with multiple medical issues, including history of prostate cancer presents with new hematuria, chest pain, weight loss.  Patient is awake and alert, hemodynamically unremarkable, given his age, comorbidities, broad differential including infection, atypical ACS, cancer, electrolyte abnormalities, dehydration all considered.  After initial results were available, the patient was found to have hematuria on urinalysis, CT scan pending.   MDM Rules/Calculators/A&P                          Initial findings concerning for abnormal urinalysis with substantial amounts of blood. On repeat exam the patient is in no distress.   Update: Discussed patient's case with our urology colleagues after reviewing his CT scan, which is concerning for bladder wall thickening. With consideration of malignancy versus invasive infection, patient will start Cipro, will have arrangement for cystoscopy in the office in the coming days.  10:36 PM Patient has completed a course of Cipro IV, has had no adverse reaction, remains otherwise hemodynamically unremarkable. We again discussed the importance of following up with urology, with consideration of infection versus malignancy. Patient  amenable to this plan.  MDM Number of Diagnoses or Management Options  Hematuria: new, needed workup   Amount and/or Complexity of Data Reviewed Clinical lab tests: reviewed Tests in the radiology section of CPT: reviewed Tests in the medicine section of CPT: reviewed Decide to obtain previous medical records or to obtain history from someone other than the patient: yes Obtain history from someone other than the patient: yes Discuss the patient with other providers: yes Independent visualization of images, tracings, or specimens: yes  Risk of Complications, Morbidity, and/or Mortality Presenting problems: high Diagnostic procedures: high Management options: high  Critical Care Total time providing critical care: < 30 minutes  Patient Progress Patient progress: stable  Final Clinical Impression(s) / ED Diagnoses Final diagnoses:  Hematuria    Rx / DC Orders ED Discharge Orders         Ordered    ciprofloxacin (CIPRO) 500 MG tablet  2 times daily     Discontinue  Reprint     06/24/20 2235           Carmin Muskrat, MD 06/24/20 2238

## 2020-06-24 NOTE — Discharge Instructions (Signed)
As discussed, with your bloody urine it is very portly follow-up with our urology colleagues.  If you do not receive a call tomorrow or the next day, please use the provided information to arrange appropriate ongoing care.  Please take all medication as directed.  Return here for concerning changes in your condition.

## 2020-06-24 NOTE — ED Triage Notes (Signed)
Pt reports hematuria for the past month and chest pain that started last night. No other symptoms.

## 2020-06-24 NOTE — ED Notes (Signed)
Pt denies CP

## 2020-06-24 NOTE — ED Notes (Signed)
Discharge instructions discussed with pt. Pt verbalized understanding with no questions at this time. Pt ambulatory at discharge.

## 2020-08-27 NOTE — Progress Notes (Deleted)
NEUROLOGY FOLLOW UP OFFICE NOTE  Theodore Wood [id number]  HISTORY OF PRESENT ILLNESS: Theodore Wood is a 69 year old right-handed black male with diabetes mellitus, HTN, HLD and history of prostate cancer who follows up for stroke.  UPDATE: Current medications:  ASA 37m daily; ***  HISTORY: [facility] for acute onset of right monocular vision loss, nausea and ataxia.  He was a code stroke but did not receive IV tPA due to outside therapeutic window.  CT head and CT perfusion was negative for acute stroke.  CTA of head and neck showed bilateral moderate supraclinoid ICA stenosis but no large vessel occlusion.  MRI of brain showed no acute stroke but did demonstrate moderate small vessel disease and gliosis in the inferior right frontal lobe related to old brain injury.  MRI of orbits was unremarkable.  2D echo showed EF 60-65% with no source of embolus.  LDL was 110 and Hgb A1c was 9.7.  Small MRI-negative brainstem infarct was suspected.  No antiplatelet therapy prior to admission and he was started on both ASA 890mand Plavix 7544maily.  He was continued on Crestor 80m58mVision returned within a few days after discharge.  Vision loss was thought to be ischemic from diabetes.     PAST MEDICAL HISTORY: Past Medical History:  Diagnosis Date  . Angina   . Diabetes mellitus without complication (HCC)[place]. GERD (gastroesophageal reflux disease)   . High cholesterol   . Hypertension   . [facility]  MEDICATIONS: Current Outpatient Medications on File Prior to Visit  Medication Sig Dispense Refill  . aspirin 81 MG EC tablet Take 1 tablet (81 mg total) by mouth daily. (Patient not taking: Reported on 06/24/2020) 30 tablet 1  . blood glucose meter kit and supplies KIT Dispense based on patient and insurance preference. Use up to four times daily as directed. (FOR ICD-9 250.00, 250.01). (Patient taking differently:  1 each by Other route See admin instructions. Dispense based on patient and insurance preference. Use up to four times daily as directed. (FOR ICD-9 250.00, 250.01).) 1 each 0  . ciprofloxacin (CIPRO) 500 MG tablet Take 1 tablet (500 mg total) by mouth 2 (two) times daily. 14 tablet 0  . fenofibrate 54 MG tablet Take 1 tablet (54 mg total) by mouth daily. 30 tablet 0  . fluticasone (FLONASE) 50 MCG/ACT nasal spray Place 1 spray into both nostrils daily.    . hydroxypropyl methylcellulose / hypromellose (ISOPTO TEARS / GONIOVISC) 2.5 % ophthalmic solution Place 1 drop into both eyes daily as needed for dry eyes.    . insulin aspart (NOVOLOG) 100 UNIT/ML injection Inject 12 Units into the skin 3 (three) times daily before meals. (Patient taking differently: Inject 10 Units into the skin daily. ) 10.8 mL 0  . insulin detemir (LEVEMIR) 100 UNIT/ML FlexPen Inject 30 Units into the skin at bedtime. (Patient not taking: Reported on 06/24/2020) 15 mL 11  . lisinopril (ZESTRIL) 10 MG tablet Take 1 tablet (10 mg total) by mouth daily. 30 tablet 0  . metFORMIN (GLUCOPHAGE) 500 MG tablet Take 1,000 mg by mouth 2 (two) times daily with a meal.     . Multiple Vitamin (MULTIVITAMIN WITH MINERALS) TABS tablet Take 1 tablet by mouth daily. (Patient not taking: Reported on 06/24/2020) 90 tablet 4  . nitroGLYCERIN (NITROSTAT) 0.3 MG SL tablet Place 1 tablet (0.3 mg total)  under the tongue every 5 (five) minutes as needed for chest pain. 30 tablet 12  . pantoprazole (PROTONIX) 40 MG tablet Take 1 tablet (40 mg total) by mouth daily. (Patient taking differently: Take 40 mg by mouth daily as needed (heart burn). ) 90 tablet 0  . rosuvastatin (CRESTOR) 40 MG tablet Take 1 tablet (40 mg total) by mouth daily at 6 PM. (Patient not taking: Reported on 06/24/2020) 90 tablet 4   No current facility-administered medications on file prior to visit.    ALLERGIES: No Known Allergies  FAMILY HISTORY: Family History  Problem  Relation Age of Onset  . Stroke Mother   . Stroke Father   . Stroke Brother   . Cancer Maternal Aunt        unknown type    SOCIAL HISTORY: Social History   Socioeconomic History  . Marital status: Legally Separated    Spouse name: Not on file  . Number of children: 4  . Years of education: Not on file  . Highest education level: Not on file  Occupational History    Comment: retired  Tobacco Use  . Smoking status: Former Smoker    Packs/day: 0.25    Years: 30.00    Pack years: 7.50    Types: Cigarettes  . Smokeless tobacco: Never Used  Vaping Use  . Vaping Use: Never used  Substance and Sexual Activity  . Alcohol use: Not Currently    Alcohol/week: 42.0 standard drinks    Types: 42 Cans of beer per week    Comment: 4 X  a week "a couple of beers"  . Drug use: Yes    Types: Marijuana, Heroin    Comment: 02/03/12 "3 days ago I did cocaine to get the pain off; used heroin years ago" 08/28/2014 " I stopped using all that"  . Sexual activity: Yes    Birth control/protection: Condom  Other Topics Concern  . Not on file  Social History Narrative   [place] [employer] for [place]. Separated from wife.    Right handed   One story home   Drinks caffeine occasion   Social Determinants of Health   Financial Resource Strain:   . Difficulty of Paying Living Expenses: Not on file  Food Insecurity:   . Worried About Charity fundraiser in the Last Year: Not on file  . Ran Out of Food in the Last Year: Not on file  Transportation Needs:   . Lack of Transportation (Medical): Not on file  . Lack of Transportation (Non-Medical): Not on file  Physical Activity:   . Days of Exercise per Week: Not on file  . Minutes of Exercise per Session: Not on file  Stress:   . Feeling of Stress : Not on file  Social Connections:   . Frequency of Communication with Friends and Family: Not on file  . Frequency of Social Gatherings with Friends and Family: Not on file  . Attends Religious  Services: Not on file  . Active Member of Clubs or Organizations: Not on file  . Attends Archivist Meetings: Not on file  . Marital Status: Not on file  Intimate Partner Violence:   . Fear of Current or Ex-Partner: Not on file  . Emotionally Abused: Not on file  . Physically Abused: Not on file  . Sexually Abused: Not on file    PHYSICAL EXAM: *** General: No acute distress.  Patient appears well-groomed.   Head:  Normocephalic/atraumatic Eyes:  Fundi examined but  not visualized Neck: supple, no paraspinal tenderness, full range of motion Heart:  Regular rate and rhythm Lungs:  Clear to auscultation bilaterally Back: No paraspinal tenderness Neurological Exam: alert and oriented to person, place, and time. Attention span and concentration intact, recent and remote memory intact, fund of knowledge intact.  Speech fluent and not dysarthric, language intact.  CN II-XII intact. Bulk and tone normal, muscle strength 5/5 throughout.  Sensation to light touch, temperature and vibration intact.  Deep tendon reflexes 2+ throughout, toes downgoing.  Finger to nose and heel to shin testing intact.  Gait normal, Romberg negative.  IMPRESSION: 1.  Possible MRI-negative brainstem infarct secondary to small vessel disease 2.  Possible right ischemic optic neuropathy secondary to uncontrolled diabetes 3.  Type 2 diabetes mellitus 4.  Hypertension 5.  Hyperlipidemia  PLAN: Secondary stroke prevention as per PCP: - ASA 70m daily - Statin therapy (LDL goal less than 70) -  Glycemic control (Hgb A1c goal less than 7) -  Blood pressure control -  Heart healthy diet -  Routine exercise Follow up in 6 months.  AMetta Clines DO  CC: SGeraldine Solar MD

## 2020-08-28 ENCOUNTER — Ambulatory Visit: Payer: No Typology Code available for payment source | Admitting: Neurology

## 2021-06-30 ENCOUNTER — Other Ambulatory Visit: Payer: Self-pay | Admitting: Student

## 2021-06-30 DIAGNOSIS — G629 Polyneuropathy, unspecified: Secondary | ICD-10-CM

## 2021-07-02 IMAGING — DX DG CHEST 2V
2 series · 2 of 2 positions shown · non-contrast
Comparison: September 02, 2018.

CLINICAL DATA: Chest pain.

EXAM:
CHEST - 2 VIEW

[w chest pa]
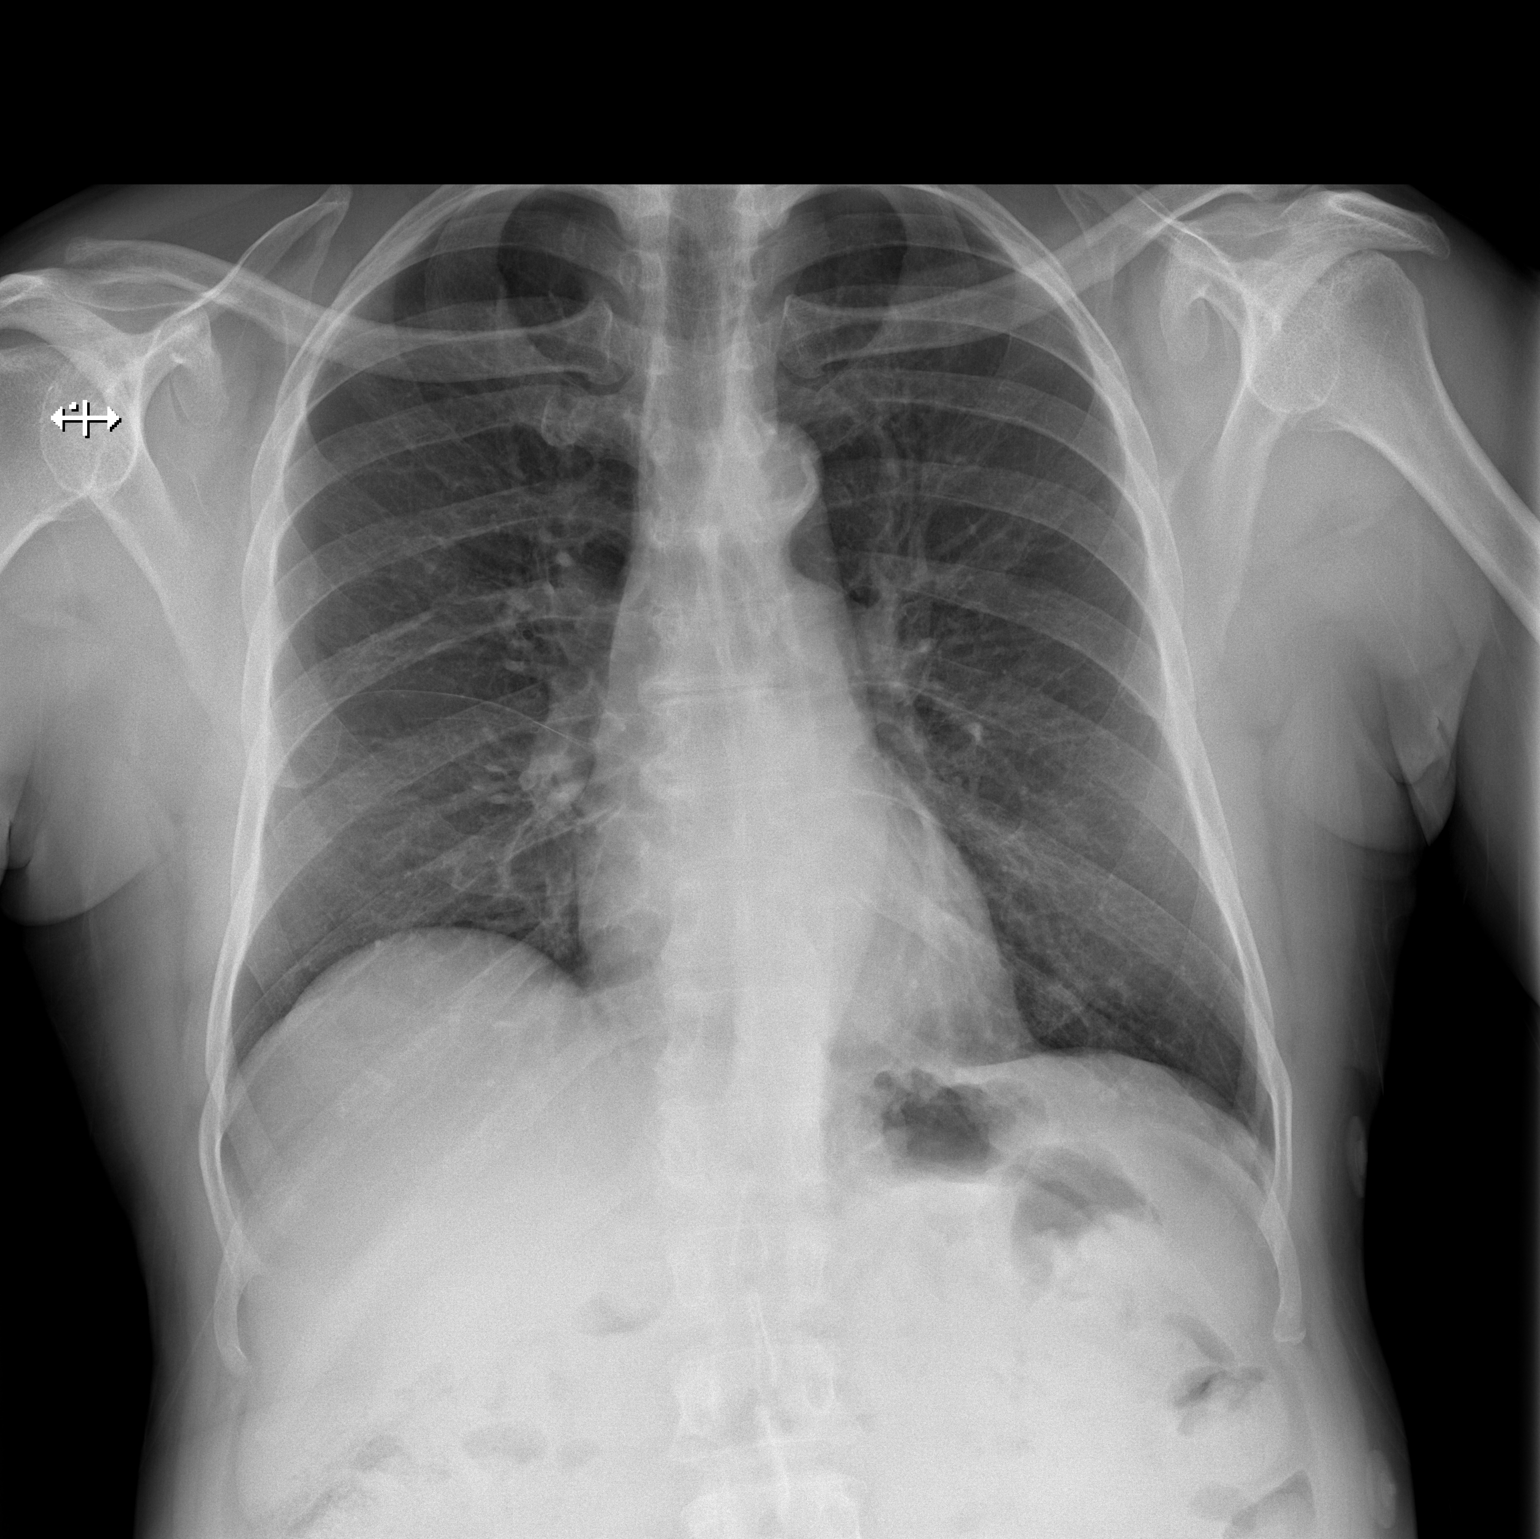

[w chest lat]
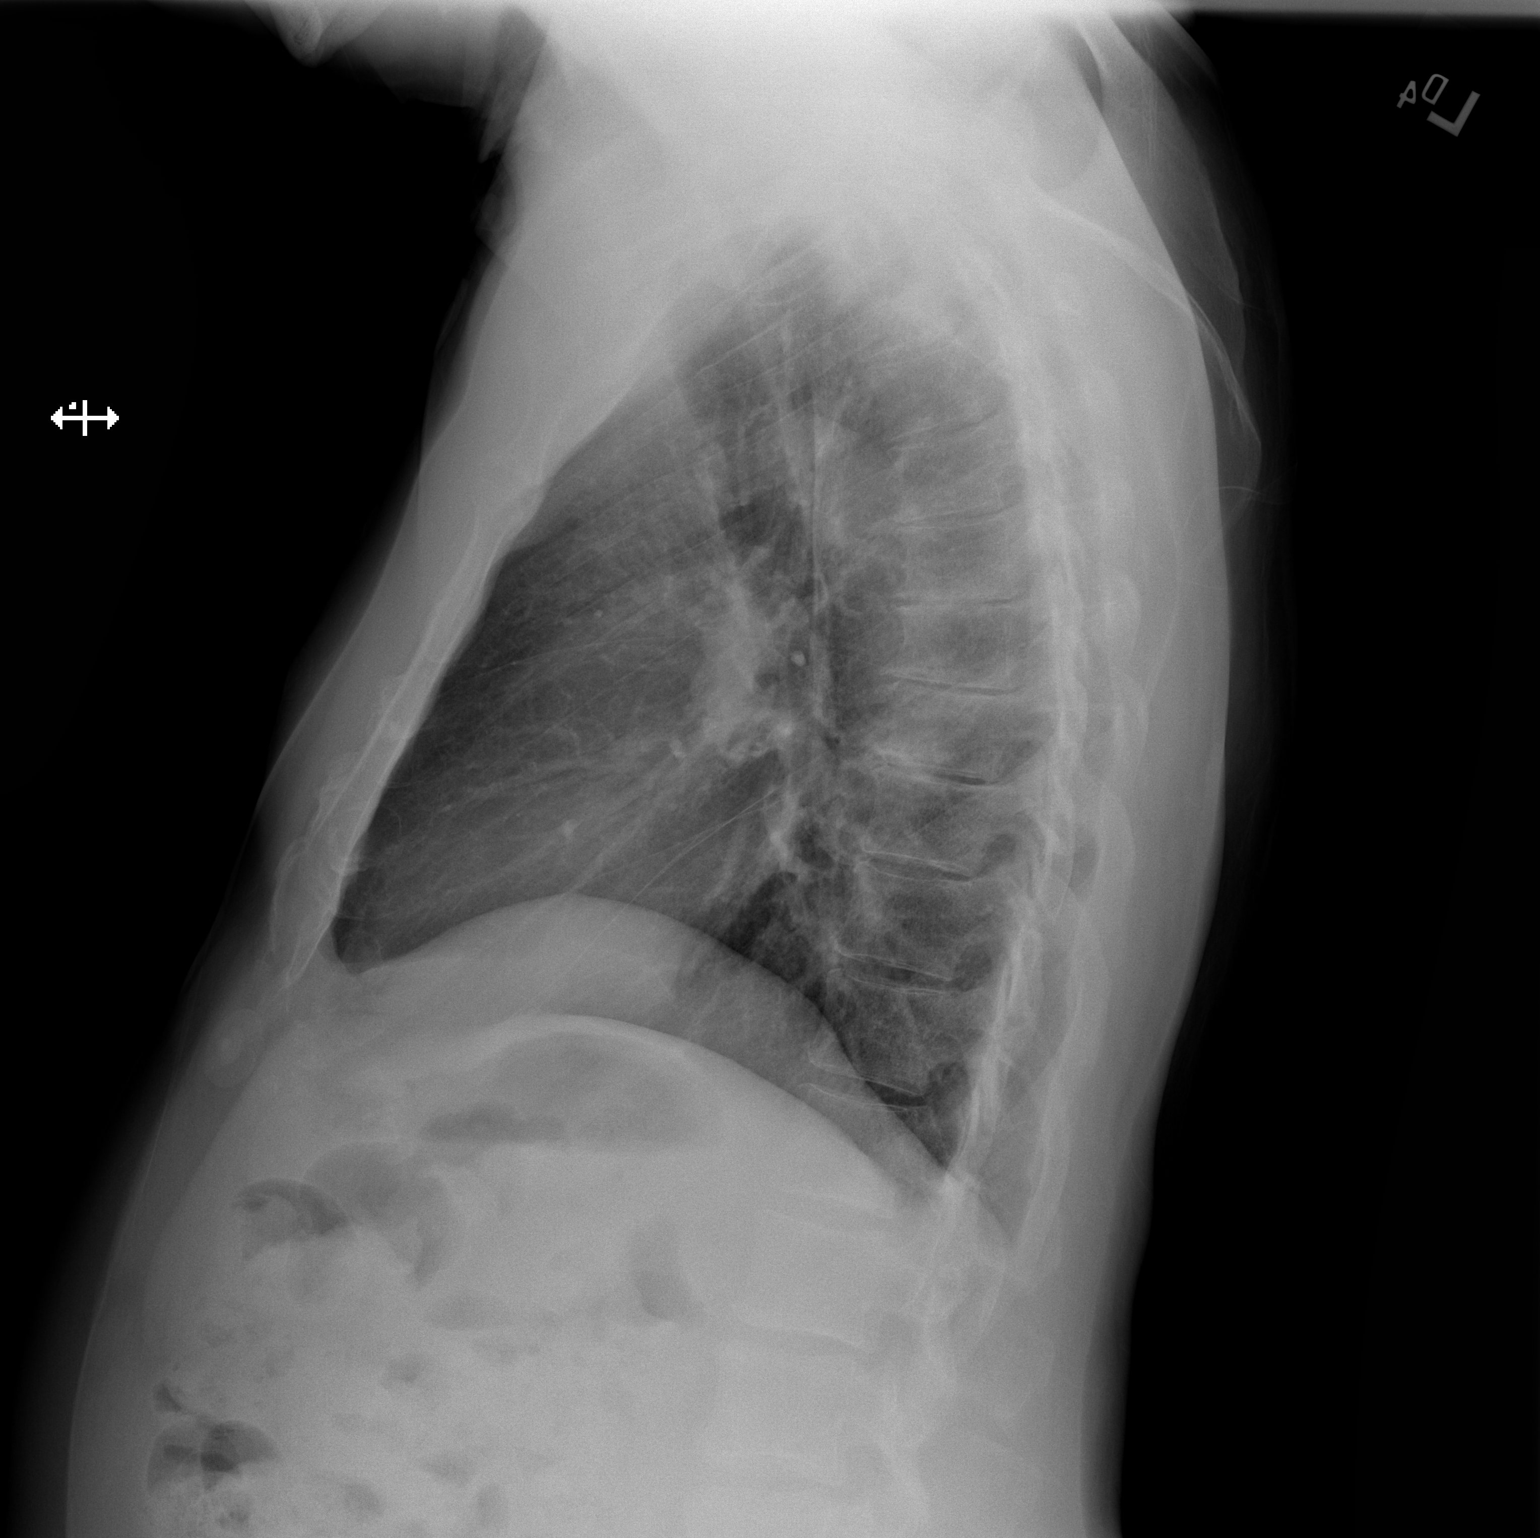

[2 of 2 positions shown; findings below may reference images not displayed]

FINDINGS: The heart size and mediastinal contours are within normal limits.
Both lungs are clear. No pneumothorax or pleural effusion is noted.
The visualized skeletal structures are unremarkable.
IMPRESSION: No active cardiopulmonary disease.

Aortic Atherosclerosis (ZFOPI-NEA.A).

## 2021-07-07 ENCOUNTER — Other Ambulatory Visit: Payer: No Typology Code available for payment source

## 2021-07-14 ENCOUNTER — Ambulatory Visit
Admission: RE | Admit: 2021-07-14 | Discharge: 2021-07-14 | Disposition: A | Discharge status: Home / Self Care | Payer: Medicare Other | Source: Ambulatory Visit | Attending: Student | Admitting: Student

## 2021-07-14 DIAGNOSIS — G629 Polyneuropathy, unspecified: Secondary | ICD-10-CM

## 2021-10-17 ENCOUNTER — Ambulatory Visit
Admission: RE | Admit: 2021-10-17 | Discharge: 2021-10-17 | Disposition: A | Discharge status: Home / Self Care | Payer: No Typology Code available for payment source | Source: Ambulatory Visit | Attending: Student | Admitting: Student

## 2021-10-17 ENCOUNTER — Other Ambulatory Visit: Payer: Self-pay | Admitting: Student

## 2021-10-17 DIAGNOSIS — M79605 Pain in left leg: Secondary | ICD-10-CM

## 2021-12-03 ENCOUNTER — Other Ambulatory Visit: Payer: Self-pay | Admitting: Student

## 2021-12-03 DIAGNOSIS — F17210 Nicotine dependence, cigarettes, uncomplicated: Secondary | ICD-10-CM

## 2021-12-31 ENCOUNTER — Ambulatory Visit
Admission: RE | Admit: 2021-12-31 | Discharge: 2021-12-31 | Disposition: A | Discharge status: Home / Self Care | Payer: 59 | Source: Ambulatory Visit | Attending: Student | Admitting: Student

## 2021-12-31 DIAGNOSIS — F17210 Nicotine dependence, cigarettes, uncomplicated: Secondary | ICD-10-CM

## 2022-03-04 ENCOUNTER — Other Ambulatory Visit: Payer: Self-pay | Admitting: Student

## 2022-03-04 DIAGNOSIS — M79605 Pain in left leg: Secondary | ICD-10-CM

## 2022-03-06 ENCOUNTER — Inpatient Hospital Stay: Admission: RE | Admit: 2022-03-06 | Payer: No Typology Code available for payment source | Source: Ambulatory Visit

## 2022-08-24 ENCOUNTER — Other Ambulatory Visit: Payer: Self-pay | Admitting: Neurosurgery

## 2022-08-27 NOTE — Pre-Procedure Instructions (Signed)
Surgical Instructions    Your procedure is scheduled on Tuesday, September 19th.  Report to [facility] Main Entrance "A" at 08:45 A.M., then check in with the Admitting office.  Call this number if you have problems the morning of surgery:  [phone]   If you have any questions prior to your surgery date call [phone]: Open Monday-Friday 8am-4pm    Remember:  Do not eat or drink after midnight the night before your surgery      Take these medicines the morning of surgery with A SIP OF WATER  fenofibrate  fluticasone (FLONASE)  NIFEdipine (ADALAT CC)  rosuvastatin (CRESTOR)   If needed: hydroxypropyl methylcellulose / hypromellose (ISOPTO TEARS / GONIOVISC)  nitroGLYCERIN (NITROSTAT) cetirizine (ZYRTEC)  gabapentin (NEURONTIN) DULoxetine (CYMBALTA)  As of today, STOP taking any Aleve, Naproxen, Ibuprofen, Motrin, Advil, Goody's, BC's, all herbal medications, fish oil, and all vitamins.   WHAT DO I DO ABOUT MY DIABETES MEDICATION?   Do not take metFORMIN (GLUCOPHAGE)  the morning of surgery. Hold empagliflozin (JARDIANCE) for 3 days prior to surgery. Last dose 9/16.     HOW TO MANAGE YOUR DIABETES BEFORE AND AFTER SURGERY  Why is it important to control my blood sugar before and after surgery? Improving blood sugar levels before and after surgery helps healing and can limit problems. A way of improving blood sugar control is eating a healthy diet by:  Eating less sugar and carbohydrates  Increasing activity/exercise  Talking with your doctor about reaching your blood sugar goals High blood sugars (greater than 180 mg/dL) can raise your risk of infections and slow your recovery, so you will need to focus on controlling your diabetes during the weeks before surgery. Make sure that the doctor who takes care of your diabetes knows about your planned surgery including the date and location.  How do I manage my blood sugar before surgery? Check your blood sugar at  least 4 times a day, starting 2 days before surgery, to make sure that the level is not too high or low.  Check your blood sugar the morning of your surgery when you wake up and every 2 hours until you get to the Short Stay unit.  If your blood sugar is less than 70 mg/dL, you will need to treat for low blood sugar: Do not take insulin. Treat a low blood sugar (less than 70 mg/dL) with  cup of clear juice (cranberry or apple), 4 glucose tablets, OR glucose gel. Recheck blood sugar in 15 minutes after treatment (to make sure it is greater than 70 mg/dL). If your blood sugar is not greater than 70 mg/dL on recheck, call [phone] for further instructions. Report your blood sugar to the short stay nurse when you get to Short Stay.  If you are admitted to the hospital after surgery: Your blood sugar will be checked by the staff and you will probably be given insulin after surgery (instead of oral diabetes medicines) to make sure you have good blood sugar levels. The goal for blood sugar control after surgery is 80-180 mg/dL.                     Do NOT Smoke (Tobacco/Vaping) for 24 hours prior to your procedure.  If you use a CPAP at night, you may bring your mask/headgear for your overnight stay.   Contacts, glasses, piercing's, hearing aid's, dentures or partials may not be worn into surgery, please bring cases for these belongings.    For  patients admitted to the hospital, discharge time will be determined by your treatment team.   Patients discharged the day of surgery will not be allowed to drive home, and someone needs to stay with them for 24 hours.  SURGICAL WAITING ROOM VISITATION Patients having surgery or a procedure may have no more than 2 support people in the waiting area - these visitors may rotate.   Children under the age of 2 must have an adult with them who is not the patient. If the patient needs to stay at the hospital during part of their recovery, the visitor  guidelines for inpatient rooms apply. Pre-op nurse will coordinate an appropriate time for 1 support person to accompany patient in pre-op.  This support person may not rotate.   Please refer to the [facility] website for the visitor guidelines for Inpatients (after your surgery is over and you are in a regular room).    Special instructions:   [facility]- Preparing For Surgery  Before surgery, you can play an important role. Because skin is not sterile, your skin needs to be as free of germs as possible. You can reduce the number of germs on your skin by washing with CHG (chlorahexidine gluconate) Soap before surgery.  CHG is an antiseptic cleaner which kills germs and bonds with the skin to continue killing germs even after washing.    Oral Hygiene is also important to reduce your risk of infection.  Remember - BRUSH YOUR TEETH THE MORNING OF SURGERY WITH YOUR REGULAR TOOTHPASTE  Please do not use if you have an allergy to CHG or antibacterial soaps. If your skin becomes reddened/irritated stop using the CHG.  Do not shave (including legs and underarms) for at least 48 hours prior to first CHG shower. It is OK to shave your face.  Please follow these instructions carefully.   Shower the NIGHT BEFORE SURGERY and the MORNING OF SURGERY  If you chose to wash your hair, wash your hair first as usual with your normal shampoo.  After you shampoo, rinse your hair and body thoroughly to remove the shampoo.  Use CHG Soap as you would any other liquid soap. You can apply CHG directly to the skin and wash gently with a scrungie or a clean washcloth.   Apply the CHG Soap to your body ONLY FROM THE NECK DOWN.  Do not use on open wounds or open sores. Avoid contact with your eyes, ears, mouth and genitals (private parts). Wash Face and genitals (private parts)  with your normal soap.   Wash thoroughly, paying special attention to the area where your surgery will be performed.  Thoroughly rinse  your body with warm water from the neck down.  DO NOT shower/wash with your normal soap after using and rinsing off the CHG Soap.  Pat yourself dry with a CLEAN TOWEL.  Wear CLEAN PAJAMAS to bed the night before surgery  Place CLEAN SHEETS on your bed the night before your surgery  DO NOT SLEEP WITH PETS.   Day of Surgery: Take a shower with CHG soap. Do not wear jewelry  Do not wear lotions, powders, colognes, or deodorant. Men may shave face and neck. Do not bring valuables to the hospital. [facility] is not responsible for any belongings or valuables.  Wear Clean/Comfortable clothing the morning of surgery Remember to brush your teeth WITH YOUR REGULAR TOOTHPASTE.   Please read over the following fact sheets that you were given.    If you received  a COVID test during your pre-op visit  it is requested that you wear a mask when out in public, stay away from anyone that may not be feeling well and notify your surgeon if you develop symptoms. If you have been in contact with anyone that has tested positive in the last 10 days please notify you surgeon.

## 2022-08-28 ENCOUNTER — Encounter (HOSPITAL_COMMUNITY)
Admission: RE | Admit: 2022-08-28 | Discharge: 2022-08-28 | Disposition: A | Discharge status: Home / Self Care | Payer: No Typology Code available for payment source | Source: Ambulatory Visit | Attending: Neurosurgery | Admitting: Neurosurgery

## 2022-08-28 ENCOUNTER — Other Ambulatory Visit: Payer: Self-pay

## 2022-08-28 ENCOUNTER — Encounter (HOSPITAL_COMMUNITY): Payer: Self-pay

## 2022-08-28 VITALS — BP 99/60 | HR 80 | Temp 97.9°F | Resp 17 | Ht 66.0 in | Wt 163.3 lb

## 2022-08-28 DIAGNOSIS — E119 Type 2 diabetes mellitus without complications: Secondary | ICD-10-CM | POA: Diagnosis not present

## 2022-08-28 DIAGNOSIS — Z01818 Encounter for other preprocedural examination: Secondary | ICD-10-CM | POA: Diagnosis present

## 2022-08-28 DIAGNOSIS — I10 Essential (primary) hypertension: Secondary | ICD-10-CM | POA: Diagnosis not present

## 2022-08-28 LAB — BASIC METABOLIC PANEL
Anion gap: 8 (ref 5–15)
BUN: 16 mg/dL (ref 8–23)
CO2: 29 mmol/L (ref 22–32)
Calcium: 9.8 mg/dL (ref 8.9–10.3)
Chloride: 104 mmol/L (ref 98–111)
Creatinine, Ser: 1.19 mg/dL (ref 0.61–1.24)
GFR, Estimated: >60 mL/min (ref >60)
Glucose, Bld: 101 mg/dL — ABNORMAL HIGH (ref 70–99)
Potassium: 4.1 mmol/L (ref 3.5–5.1)
Sodium: 141 mmol/L (ref 135–145)

## 2022-08-28 LAB — CBC
HCT: 42.9 % (ref 39.0–52.0)
Hemoglobin: 14.5 g/dL (ref 13.0–17.0)
MCH: 29.9 pg (ref 26.0–34.0)
MCHC: 33.8 g/dL (ref 30.0–36.0)
MCV: 88.5 fL (ref 80.0–100.0)
Platelets: 222 10*3/uL (ref 150–400)
RBC: 4.85 MIL/uL (ref 4.22–5.81)
RDW: 14.1 % (ref 11.5–15.5)
WBC: 4.7 10*3/uL (ref 4.0–10.5)
nRBC: 0 % (ref 0.0–0.2)

## 2022-08-28 LAB — TYPE AND SCREEN
ABO/RH(D): B POS
Antibody Screen: NEGATIVE

## 2022-08-28 LAB — SURGICAL PCR SCREEN
MRSA, PCR: NEGATIVE
Staphylococcus aureus: NEGATIVE

## 2022-08-28 LAB — HEMOGLOBIN A1C
Hgb A1c MFr Bld: 6.5 % — ABNORMAL HIGH (ref 4.8–5.6)
Mean Plasma Glucose: 139.85 mg/dL

## 2022-08-28 LAB — GLUCOSE, CAPILLARY: Glucose-Capillary: 130 mg/dL — ABNORMAL HIGH (ref 70–99)

## 2022-08-28 NOTE — Progress Notes (Signed)
PCP - Dr. Geraldine Solar Cardiologist - denies  PPM/ICD - denies   Chest x-ray - 06/24/20 EKG - 08/28/22 Stress Test - 01/13/16 ECHO - 03/14/20 Cardiac Cath - 1990's  Sleep Study - denies  DM- Type 2 Fasting Blood Sugar - 90-100 Checks Blood Sugar once a day  ASA/Blood Thinner Instructions: n/a   ERAS Protcol - NPO   COVID TEST- n/a   Anesthesia review: no  Patient denies shortness of breath, fever, cough and chest pain at PAT appointment   All instructions explained to the patient, with a verbal understanding of the material. Patient agrees to go over the instructions while at home for a better understanding. The opportunity to ask questions was provided.

## 2022-09-01 ENCOUNTER — Ambulatory Visit (HOSPITAL_COMMUNITY): Payer: No Typology Code available for payment source

## 2022-09-01 ENCOUNTER — Other Ambulatory Visit: Payer: Self-pay

## 2022-09-01 ENCOUNTER — Encounter (HOSPITAL_COMMUNITY)
Admission: RE | Disposition: A | Discharge status: Home / Self Care | Payer: Self-pay | Source: Home / Self Care | Attending: Neurosurgery

## 2022-09-01 ENCOUNTER — Ambulatory Visit (HOSPITAL_BASED_OUTPATIENT_CLINIC_OR_DEPARTMENT_OTHER): Payer: No Typology Code available for payment source | Admitting: Anesthesiology

## 2022-09-01 ENCOUNTER — Encounter (HOSPITAL_COMMUNITY): Payer: Self-pay | Admitting: Neurosurgery

## 2022-09-01 ENCOUNTER — Observation Stay (HOSPITAL_COMMUNITY)
Admission: RE | Admit: 2022-09-01 | Discharge: 2022-09-02 | Disposition: A | Discharge status: Home / Self Care | Payer: No Typology Code available for payment source | Attending: Neurosurgery | Admitting: Neurosurgery

## 2022-09-01 ENCOUNTER — Ambulatory Visit (HOSPITAL_COMMUNITY): Payer: No Typology Code available for payment source | Admitting: Anesthesiology

## 2022-09-01 DIAGNOSIS — I10 Essential (primary) hypertension: Secondary | ICD-10-CM | POA: Insufficient documentation

## 2022-09-01 DIAGNOSIS — Z8546 Personal history of malignant neoplasm of prostate: Secondary | ICD-10-CM | POA: Insufficient documentation

## 2022-09-01 DIAGNOSIS — Z79899 Other long term (current) drug therapy: Secondary | ICD-10-CM | POA: Diagnosis not present

## 2022-09-01 DIAGNOSIS — Z7982 Long term (current) use of aspirin: Secondary | ICD-10-CM | POA: Insufficient documentation

## 2022-09-01 DIAGNOSIS — M48061 Spinal stenosis, lumbar region without neurogenic claudication: Secondary | ICD-10-CM

## 2022-09-01 DIAGNOSIS — Z7984 Long term (current) use of oral hypoglycemic drugs: Secondary | ICD-10-CM | POA: Diagnosis not present

## 2022-09-01 DIAGNOSIS — Z794 Long term (current) use of insulin: Secondary | ICD-10-CM | POA: Insufficient documentation

## 2022-09-01 DIAGNOSIS — M4310 Spondylolisthesis, site unspecified: Secondary | ICD-10-CM | POA: Diagnosis present

## 2022-09-01 DIAGNOSIS — E119 Type 2 diabetes mellitus without complications: Secondary | ICD-10-CM | POA: Insufficient documentation

## 2022-09-01 DIAGNOSIS — F17210 Nicotine dependence, cigarettes, uncomplicated: Secondary | ICD-10-CM | POA: Insufficient documentation

## 2022-09-01 DIAGNOSIS — M4316 Spondylolisthesis, lumbar region: Secondary | ICD-10-CM | POA: Diagnosis not present

## 2022-09-01 DIAGNOSIS — M5116 Intervertebral disc disorders with radiculopathy, lumbar region: Secondary | ICD-10-CM

## 2022-09-01 DIAGNOSIS — S32049A Unspecified fracture of fourth lumbar vertebra, initial encounter for closed fracture: Secondary | ICD-10-CM | POA: Diagnosis not present

## 2022-09-01 DIAGNOSIS — M5416 Radiculopathy, lumbar region: Secondary | ICD-10-CM | POA: Insufficient documentation

## 2022-09-01 LAB — GLUCOSE, CAPILLARY
Glucose-Capillary: 80 mg/dL (ref 70–99)
Glucose-Capillary: 72 mg/dL (ref 70–99)
Glucose-Capillary: 87 mg/dL (ref 70–99)
Glucose-Capillary: 87 mg/dL (ref 70–99)
Glucose-Capillary: 104 mg/dL — ABNORMAL HIGH (ref 70–99)
Glucose-Capillary: 163 mg/dL — ABNORMAL HIGH (ref 70–99)

## 2022-09-01 SURGERY — POSTERIOR LUMBAR FUSION 1 LEVEL
Anesthesia: General | Site: Spine Lumbar

## 2022-09-01 MED ORDER — HYDROCODONE-ACETAMINOPHEN 10-325 MG PO TABS
1.0000 | ORAL_TABLET | ORAL | Status: DC | PRN
  Administered 2022-09-01 – 2022-09-02 (×3): 1 via ORAL
  Filled 2022-09-01 (×3): qty 1

## 2022-09-01 MED ORDER — 0.9 % SODIUM CHLORIDE (POUR BTL) OPTIME
TOPICAL | Status: DC | PRN
  Administered 2022-09-01: 1000 mL

## 2022-09-01 MED ORDER — ACETAMINOPHEN 325 MG PO TABS: 650.0000 mg | ORAL_TABLET | ORAL | Status: DC | PRN

## 2022-09-01 MED ORDER — SODIUM CHLORIDE 0.9% FLUSH
3.0000 mL | Freq: Two times a day (BID) | INTRAVENOUS | Status: DC
  Administered 2022-09-01: 3 mL via INTRAVENOUS

## 2022-09-01 MED ORDER — SODIUM CHLORIDE 0.9% FLUSH: 3.0000 mL | INTRAVENOUS | Status: DC | PRN

## 2022-09-01 MED ORDER — ORAL CARE MOUTH RINSE: 15.0000 mL | Freq: Once | OROMUCOSAL | Status: AC

## 2022-09-01 MED ORDER — ALUM & MAG HYDROXIDE-SIMETH 200-200-20 MG/5ML PO SUSP
30.0000 mL | Freq: Four times a day (QID) | ORAL | Status: DC | PRN
  Administered 2022-09-01: 30 mL via ORAL

## 2022-09-01 MED ORDER — DEXAMETHASONE SODIUM PHOSPHATE 10 MG/ML IJ SOLN
INTRAMUSCULAR | Status: DC | PRN
  Administered 2022-09-01: 10 mg via INTRAVENOUS

## 2022-09-01 MED ORDER — EMPAGLIFLOZIN 10 MG PO TABS
10.0000 mg | ORAL_TABLET | Freq: Every day | ORAL | Status: DC
  Administered 2022-09-02: 10 mg via ORAL
  Filled 2022-09-01: qty 1

## 2022-09-01 MED ORDER — BISACODYL 10 MG RE SUPP: 10.0000 mg | Freq: Every day | RECTAL | Status: DC | PRN

## 2022-09-01 MED ORDER — LACTATED RINGERS IV SOLN: INTRAVENOUS | Status: DC

## 2022-09-01 MED ORDER — NITROGLYCERIN 0.4 MG SL SUBL: 0.4000 mg | SUBLINGUAL_TABLET | SUBLINGUAL | Status: DC | PRN

## 2022-09-01 MED ORDER — ROSUVASTATIN CALCIUM 20 MG PO TABS
40.0000 mg | ORAL_TABLET | Freq: Every day | ORAL | Status: DC
  Administered 2022-09-01 – 2022-09-02 (×2): 40 mg via ORAL
  Filled 2022-09-01 (×2): qty 2

## 2022-09-01 MED ORDER — FENTANYL CITRATE (PF) 250 MCG/5ML IJ SOLN
INTRAMUSCULAR | Status: AC
  Filled 2022-09-01: qty 5

## 2022-09-01 MED ORDER — DIAZEPAM 5 MG PO TABS
5.0000 mg | ORAL_TABLET | Freq: Four times a day (QID) | ORAL | Status: DC | PRN
  Administered 2022-09-01: 5 mg via ORAL
  Filled 2022-09-01: qty 1

## 2022-09-01 MED ORDER — LOSARTAN POTASSIUM 50 MG PO TABS
100.0000 mg | ORAL_TABLET | Freq: Every day | ORAL | Status: DC
  Administered 2022-09-01 – 2022-09-02 (×2): 100 mg via ORAL
  Filled 2022-09-01 (×2): qty 2

## 2022-09-01 MED ORDER — POLYETHYLENE GLYCOL 3350 17 G PO PACK: 17.0000 g | PACK | Freq: Every day | ORAL | Status: DC | PRN

## 2022-09-01 MED ORDER — BUPIVACAINE HCL (PF) 0.25 % IJ SOLN
INTRAMUSCULAR | Status: AC
  Filled 2022-09-01: qty 30

## 2022-09-01 MED ORDER — CHLORHEXIDINE GLUCONATE CLOTH 2 % EX PADS: 6.0000 | MEDICATED_PAD | Freq: Once | CUTANEOUS | Status: DC

## 2022-09-01 MED ORDER — ONDANSETRON HCL 4 MG/2ML IJ SOLN
INTRAMUSCULAR | Status: DC | PRN
  Administered 2022-09-01: 4 mg via INTRAVENOUS

## 2022-09-01 MED ORDER — PROPOFOL 10 MG/ML IV BOLUS
INTRAVENOUS | Status: AC
  Filled 2022-09-01: qty 20

## 2022-09-01 MED ORDER — FENTANYL CITRATE (PF) 100 MCG/2ML IJ SOLN
INTRAMUSCULAR | Status: AC
  Filled 2022-09-01: qty 2

## 2022-09-01 MED ORDER — ACETAMINOPHEN 500 MG PO TABS
1000.0000 mg | ORAL_TABLET | Freq: Once | ORAL | Status: AC
  Administered 2022-09-01: 1000 mg via ORAL
  Filled 2022-09-01: qty 2

## 2022-09-01 MED ORDER — LIDOCAINE 2% (20 MG/ML) 5 ML SYRINGE
INTRAMUSCULAR | Status: DC | PRN
  Administered 2022-09-01: 80 mg via INTRAVENOUS

## 2022-09-01 MED ORDER — PROPOFOL 10 MG/ML IV BOLUS
INTRAVENOUS | Status: DC | PRN
  Administered 2022-09-01: 140 mg via INTRAVENOUS

## 2022-09-01 MED ORDER — PHENYLEPHRINE HCL-NACL 20-0.9 MG/250ML-% IV SOLN
INTRAVENOUS | Status: DC | PRN
  Administered 2022-09-01: 25 ug/min via INTRAVENOUS

## 2022-09-01 MED ORDER — ONDANSETRON HCL 4 MG/2ML IJ SOLN
INTRAMUSCULAR | Status: AC
  Filled 2022-09-01: qty 2

## 2022-09-01 MED ORDER — NIFEDIPINE ER OSMOTIC RELEASE 30 MG PO TB24
30.0000 mg | ORAL_TABLET | Freq: Every day | ORAL | Status: DC
  Administered 2022-09-02: 30 mg via ORAL
  Filled 2022-09-01: qty 1

## 2022-09-01 MED ORDER — PHENOL 1.4 % MT LIQD
1.0000 | OROMUCOSAL | Status: DC | PRN
  Filled 2022-09-01: qty 177

## 2022-09-01 MED ORDER — FENTANYL CITRATE (PF) 100 MCG/2ML IJ SOLN
25.0000 ug | INTRAMUSCULAR | Status: DC | PRN
  Administered 2022-09-01: 50 ug via INTRAVENOUS

## 2022-09-01 MED ORDER — INSULIN DETEMIR 100 UNIT/ML ~~LOC~~ SOLN
30.0000 [IU] | Freq: Every day | SUBCUTANEOUS | Status: DC
  Filled 2022-09-01: qty 0.3

## 2022-09-01 MED ORDER — HYPROMELLOSE (GONIOSCOPIC) 2.5 % OP SOLN: 1.0000 [drp] | Freq: Every day | OPHTHALMIC | Status: DC | PRN

## 2022-09-01 MED ORDER — METFORMIN HCL ER 500 MG PO TB24
500.0000 mg | ORAL_TABLET | Freq: Two times a day (BID) | ORAL | Status: DC
  Administered 2022-09-01 – 2022-09-02 (×2): 500 mg via ORAL
  Filled 2022-09-01 (×2): qty 1

## 2022-09-01 MED ORDER — LIDOCAINE 2% (20 MG/ML) 5 ML SYRINGE: INTRAMUSCULAR | Status: DC | PRN

## 2022-09-01 MED ORDER — FLEET ENEMA 7-19 GM/118ML RE ENEM: 1.0000 | ENEMA | Freq: Once | RECTAL | Status: DC | PRN

## 2022-09-01 MED ORDER — FENTANYL CITRATE (PF) 250 MCG/5ML IJ SOLN
INTRAMUSCULAR | Status: DC | PRN
  Administered 2022-09-01 (×2): 50 ug via INTRAVENOUS
  Administered 2022-09-01: 100 ug via INTRAVENOUS
  Administered 2022-09-01: 50 ug via INTRAVENOUS

## 2022-09-01 MED ORDER — FLUTICASONE PROPIONATE 50 MCG/ACT NA SUSP
1.0000 | Freq: Every day | NASAL | Status: DC
  Filled 2022-09-01: qty 16

## 2022-09-01 MED ORDER — LATANOPROST 0.005 % OP SOLN
1.0000 [drp] | Freq: Every day | OPHTHALMIC | Status: DC
  Administered 2022-09-01: 1 [drp] via OPHTHALMIC
  Filled 2022-09-01: qty 2.5

## 2022-09-01 MED ORDER — CEFAZOLIN SODIUM-DEXTROSE 2-4 GM/100ML-% IV SOLN
2.0000 g | INTRAVENOUS | Status: AC
  Administered 2022-09-01: 2 g via INTRAVENOUS
  Filled 2022-09-01: qty 100

## 2022-09-01 MED ORDER — MENTHOL 3 MG MT LOZG: 1.0000 | LOZENGE | OROMUCOSAL | Status: DC | PRN

## 2022-09-01 MED ORDER — ONDANSETRON HCL 4 MG/2ML IJ SOLN
4.0000 mg | Freq: Four times a day (QID) | INTRAMUSCULAR | Status: DC | PRN
  Administered 2022-09-01: 4 mg via INTRAVENOUS
  Filled 2022-09-01: qty 2

## 2022-09-01 MED ORDER — SODIUM CHLORIDE 0.9 % IV SOLN
250.0000 mL | INTRAVENOUS | Status: DC
  Administered 2022-09-01: 250 mL via INTRAVENOUS

## 2022-09-01 MED ORDER — HYDROCHLOROTHIAZIDE 25 MG PO TABS
25.0000 mg | ORAL_TABLET | Freq: Every day | ORAL | Status: DC
  Administered 2022-09-02: 25 mg via ORAL
  Filled 2022-09-01: qty 1

## 2022-09-01 MED ORDER — THROMBIN 20000 UNITS EX SOLR: CUTANEOUS | Status: DC | PRN

## 2022-09-01 MED ORDER — SUGAMMADEX SODIUM 200 MG/2ML IV SOLN
INTRAVENOUS | Status: DC | PRN
  Administered 2022-09-01: 200 mg via INTRAVENOUS

## 2022-09-01 MED ORDER — VANCOMYCIN HCL 1000 MG IV SOLR
INTRAVENOUS | Status: DC | PRN
  Administered 2022-09-01: 1000 mg

## 2022-09-01 MED ORDER — LIDOCAINE 2% (20 MG/ML) 5 ML SYRINGE
INTRAMUSCULAR | Status: AC
  Filled 2022-09-01: qty 5

## 2022-09-01 MED ORDER — VANCOMYCIN HCL 1000 MG IV SOLR
INTRAVENOUS | Status: AC
  Filled 2022-09-01: qty 20

## 2022-09-01 MED ORDER — LISINOPRIL 10 MG PO TABS: 10.0000 mg | ORAL_TABLET | Freq: Every day | ORAL | Status: DC

## 2022-09-01 MED ORDER — DEXAMETHASONE SODIUM PHOSPHATE 10 MG/ML IJ SOLN
INTRAMUSCULAR | Status: AC
  Filled 2022-09-01: qty 1

## 2022-09-01 MED ORDER — CHLORHEXIDINE GLUCONATE 0.12 % MT SOLN
15.0000 mL | Freq: Once | OROMUCOSAL | Status: AC
  Administered 2022-09-01: 15 mL via OROMUCOSAL
  Filled 2022-09-01: qty 15

## 2022-09-01 MED ORDER — HYDROMORPHONE HCL 1 MG/ML IJ SOLN: 1.0000 mg | INTRAMUSCULAR | Status: DC | PRN

## 2022-09-01 MED ORDER — DULOXETINE HCL 30 MG PO CPEP: 60.0000 mg | ORAL_CAPSULE | Freq: Every day | ORAL | Status: DC | PRN

## 2022-09-01 MED ORDER — ROCURONIUM BROMIDE 10 MG/ML (PF) SYRINGE
PREFILLED_SYRINGE | INTRAVENOUS | Status: AC
  Filled 2022-09-01: qty 10

## 2022-09-01 MED ORDER — BUPIVACAINE HCL (PF) 0.25 % IJ SOLN
INTRAMUSCULAR | Status: DC | PRN
  Administered 2022-09-01: 20 mL

## 2022-09-01 MED ORDER — ACETAMINOPHEN 650 MG RE SUPP: 650.0000 mg | RECTAL | Status: DC | PRN

## 2022-09-01 MED ORDER — FENTANYL CITRATE (PF) 250 MCG/5ML IJ SOLN: INTRAMUSCULAR | Status: DC | PRN

## 2022-09-01 MED ORDER — ONDANSETRON HCL 4 MG/2ML IJ SOLN: 4.0000 mg | Freq: Once | INTRAMUSCULAR | Status: DC | PRN

## 2022-09-01 MED ORDER — CEFAZOLIN SODIUM-DEXTROSE 1-4 GM/50ML-% IV SOLN
1.0000 g | Freq: Three times a day (TID) | INTRAVENOUS | Status: AC
  Administered 2022-09-01 – 2022-09-02 (×2): 1 g via INTRAVENOUS
  Filled 2022-09-01 (×2): qty 50

## 2022-09-01 MED ORDER — THROMBIN 20000 UNITS EX SOLR
CUTANEOUS | Status: AC
  Filled 2022-09-01: qty 20000

## 2022-09-01 MED ORDER — ROCURONIUM BROMIDE 10 MG/ML (PF) SYRINGE
PREFILLED_SYRINGE | INTRAVENOUS | Status: DC | PRN
  Administered 2022-09-01: 60 mg via INTRAVENOUS
  Administered 2022-09-01: 40 mg via INTRAVENOUS

## 2022-09-01 MED ORDER — LORATADINE 10 MG PO TABS
10.0000 mg | ORAL_TABLET | Freq: Every day | ORAL | Status: DC
  Administered 2022-09-02: 10 mg via ORAL
  Filled 2022-09-01: qty 1

## 2022-09-01 MED ORDER — ONDANSETRON HCL 4 MG PO TABS
4.0000 mg | ORAL_TABLET | Freq: Four times a day (QID) | ORAL | Status: DC | PRN
  Administered 2022-09-02 (×2): 4 mg via ORAL
  Filled 2022-09-01 (×2): qty 1

## 2022-09-01 MED ORDER — OXYCODONE HCL 5 MG PO TABS
10.0000 mg | ORAL_TABLET | ORAL | Status: DC | PRN
  Administered 2022-09-01: 10 mg via ORAL
  Filled 2022-09-01: qty 2

## 2022-09-01 MED ORDER — GABAPENTIN 600 MG PO TABS
600.0000 mg | ORAL_TABLET | Freq: Four times a day (QID) | ORAL | Status: DC | PRN
  Administered 2022-09-01: 600 mg via ORAL
  Filled 2022-09-01: qty 1

## 2022-09-01 MED ORDER — INSULIN ASPART 100 UNIT/ML IJ SOLN: 12.0000 [IU] | Freq: Three times a day (TID) | INTRAMUSCULAR | Status: DC

## 2022-09-01 SURGICAL SUPPLY — 62 items
BAG COUNTER SPONGE SURGICOUNT (BAG) ×1 IMPLANT
BAG DECANTER FOR FLEXI CONT (MISCELLANEOUS) ×1 IMPLANT
BENZOIN TINCTURE PRP APPL 2/3 (GAUZE/BANDAGES/DRESSINGS) ×1 IMPLANT
BLADE BONE MILL MEDIUM (MISCELLANEOUS) ×1 IMPLANT
BLADE CLIPPER SURG (BLADE) IMPLANT
BUR CUTTER 7.0 ROUND (BURR) IMPLANT
BUR MATCHSTICK NEURO 3.0 LAGG (BURR) ×1 IMPLANT
CAGE EXP CATALYFT 9 (Plate) IMPLANT
CANISTER SUCT 3000ML PPV (MISCELLANEOUS) ×1 IMPLANT
CAP LCK SPNE (Orthopedic Implant) ×6 IMPLANT
CAP LOCK SPINE RADIUS (Orthopedic Implant) IMPLANT
CAP LOCKING (Orthopedic Implant) ×6 IMPLANT
CARTRIDGE OIL MAESTRO DRILL (MISCELLANEOUS) ×1 IMPLANT
CNTNR URN SCR LID CUP LEK RST (MISCELLANEOUS) ×1 IMPLANT
CONT SPEC 4OZ STRL OR WHT (MISCELLANEOUS) ×1
COVER BACK TABLE 60X90IN (DRAPES) ×1 IMPLANT
DERMABOND ADVANCED .7 DNX12 (GAUZE/BANDAGES/DRESSINGS) ×1 IMPLANT
DIFFUSER DRILL AIR PNEUMATIC (MISCELLANEOUS) ×1 IMPLANT
DRAPE C-ARM 42X72 X-RAY (DRAPES) ×2 IMPLANT
DRAPE HALF SHEET 40X57 (DRAPES) IMPLANT
DRAPE LAPAROTOMY 100X72X124 (DRAPES) ×1 IMPLANT
DRAPE SURG 17X23 STRL (DRAPES) ×4 IMPLANT
DRSG OPSITE POSTOP 4X6 (GAUZE/BANDAGES/DRESSINGS) ×1 IMPLANT
DURAPREP 26ML APPLICATOR (WOUND CARE) ×1 IMPLANT
ELECT REM PT RETURN 9FT ADLT (ELECTROSURGICAL) ×1
ELECTRODE REM PT RTRN 9FT ADLT (ELECTROSURGICAL) ×1 IMPLANT
EVACUATOR 1/8 PVC DRAIN (DRAIN) IMPLANT
GAUZE 4X4 16PLY ~~LOC~~+RFID DBL (SPONGE) IMPLANT
GAUZE SPONGE 4X4 12PLY STRL (GAUZE/BANDAGES/DRESSINGS) IMPLANT
GLOVE BIO SURGEON STRL SZ 6.5 (GLOVE) ×1 IMPLANT
GLOVE BIOGEL PI IND STRL 6.5 (GLOVE) ×1 IMPLANT
GLOVE BIOGEL PI IND STRL 7.5 (GLOVE) IMPLANT
GLOVE ECLIPSE 9.0 STRL (GLOVE) ×2 IMPLANT
GLOVE EXAM NITRILE XL STR (GLOVE) IMPLANT
GLOVE SURG SS PI 7.5 STRL IVOR (GLOVE) IMPLANT
GOWN STRL REUS W/ TWL LRG LVL3 (GOWN DISPOSABLE) IMPLANT
GOWN STRL REUS W/ TWL XL LVL3 (GOWN DISPOSABLE) ×2 IMPLANT
GOWN STRL REUS W/TWL 2XL LVL3 (GOWN DISPOSABLE) IMPLANT
GOWN STRL REUS W/TWL LRG LVL3 (GOWN DISPOSABLE) ×1
GOWN STRL REUS W/TWL XL LVL3 (GOWN DISPOSABLE) ×4
KIT BASIN OR (CUSTOM PROCEDURE TRAY) ×1 IMPLANT
KIT TURNOVER KIT B (KITS) ×1 IMPLANT
MILL MEDIUM DISP (BLADE) ×1 IMPLANT
NEEDLE HYPO 22GX1.5 SAFETY (NEEDLE) ×1 IMPLANT
NS IRRIG 1000ML POUR BTL (IV SOLUTION) ×1 IMPLANT
OIL CARTRIDGE MAESTRO DRILL (MISCELLANEOUS)
PACK LAMINECTOMY NEURO (CUSTOM PROCEDURE TRAY) ×1 IMPLANT
PUTTY DBF 6CC CORTICAL FIBERS (Putty) IMPLANT
PUTTY DBF GRAFTON 3CC W/DELIVE (Putty) IMPLANT
ROD 5.5X60MM GREEN (Rod) IMPLANT
SCREW 5.75X45MM (Screw) IMPLANT
SPIKE FLUID TRANSFER (MISCELLANEOUS) ×1 IMPLANT
SPONGE SURGIFOAM ABS GEL 100 (HEMOSTASIS) ×1 IMPLANT
STRIP CLOSURE SKIN 1/2X4 (GAUZE/BANDAGES/DRESSINGS) ×2 IMPLANT
SUT VIC AB 0 CT1 18XCR BRD8 (SUTURE) ×2 IMPLANT
SUT VIC AB 0 CT1 8-18 (SUTURE) ×2
SUT VIC AB 2-0 CT1 18 (SUTURE) ×1 IMPLANT
SUT VIC AB 3-0 SH 8-18 (SUTURE) ×2 IMPLANT
TOWEL GREEN STERILE (TOWEL DISPOSABLE) ×1 IMPLANT
TOWEL GREEN STERILE FF (TOWEL DISPOSABLE) ×1 IMPLANT
TRAY FOLEY MTR SLVR 16FR STAT (SET/KITS/TRAYS/PACK) ×1 IMPLANT
WATER STERILE IRR 1000ML POUR (IV SOLUTION) ×1 IMPLANT

## 2022-09-01 NOTE — Progress Notes (Signed)
Attempted to call report to [facility]. Placed on list

## 2022-09-01 NOTE — Op Note (Signed)
Date of procedure: 09/01/2022  Date of dictation: Same  Service: Neurosurgery  Preoperative diagnosis: L4-5 degenerative retrolisthesis with severe central and foraminal stenosis with back pain and radiculopathy  Postoperative diagnosis: Same, intraoperative right L4 pedicle fracture  Procedure Name: Bilateral L4-5 decompressive laminotomies and foraminotomies, more than would be required for simple interbody fusion alone.  L3-4 trans foraminal interbody decompression and fusion with interbody cages and local autograft and allograft  L4-5 posterior lumbar interbody fusion utilizing interbody cages, locally harvested autograft, and allograft  L3-4-5 posterior lateral arthrodesis utilizing segmental pedicle screw fixation and local autografting.  Surgeon:Yessenia Maillet A.Leyna Vanderkolk, M.D.  Asst. Surgeon: Reinaldo Meeker, NP  Anesthesia: General  Indication: 71 year old male with severe back and bilateral lower extremity pain with ongoing severe radicular symptoms and progressive dorsiflexion weakness.  Work-up demonstrates evidence of severe disc degeneration with disc space collapse and retrolisthesis at L4-5 with resultant stenosis centrally and neuroforaminal he.  Patient presents now for decompression and fusion in hopes improving symptoms.  Operative note: After induction of anesthesia, patient [place] prone on the Wilson frame and properly padded.  Lumbar region prepped and draped sterilely.  Incision made overlying L4-5.  Dissection performed bilaterally.  Retractor placed.  Fluoroscopy used.  Levels confirmed.  Decompressive laminotomies and facetectomies then performed using Leksell rongeurs, Kerrison rongeurs and high-speed drill to remove the inferior two thirds of the lamina of L4 the entire inferior facet of L4 bilaterally and the superior facet of L5 was also resected.  The superior aspect of the lamina of L5 was resected.  Ligament flavum elevated and resected.  Decompressive foraminotomies completed  on the course exiting L4 and L5 nerve roots bilaterally.  Bilateral discectomies then performed at L4-5.  Disc base then prepared for interbody fusion.  With a distractor placed patient's right side to space was then cleaned of soft tissue and a 9 mm Medtronic expandable cage was then packed in the place and expanded.  Distractor removed patient's right side.  The space then prepared on the right side.  Morselized autograft packed at the interspace.  Second cage was then packed in the interspace and expanded.  Pedicles of L4 and L5 were then identified using surface landmarks and intraoperative fluoroscopy and superficial bone was removed overlying the pedicle of L4 and L5.  Pedicle was then probed using a pedicle awl.  Each pedicle all track was then probed and found to be solidly within the bone.  Each pedicle tract was then tapped with a screw tap.  Screw double was probed and found to be solidly for the bone.  On the right side at L4 unfortunately was noted that the pedicle split extending down into the body of L4 and leaving the superior reticular process mobile into the L3-4 space.  I felt like this would be an unacceptable long-term situation for the patient and I decided to extend the fusion 1 level cephalad to the L3-4 level.  I did a decompressive laminotomy on the right at L3-4.  Facetectomies then performed of the right at L3-4.  Disc space was then exposed and incised.  Discectomy performed the right side.  Disc base then prepared for interbody fusion.  Morselized autograft was packed in interspace.  A 9 mm expandable cage was then impacted in the place and expanded.  Pedicles of L3 were identified bilaterally.  Each pedicle was then tapped with a pedicle awl.  Each pedicle all track was probed and found to be solid within bone.  Each pedicle tract was then tapped  with a screw tap.  Screw double was probed and found to be solid within bone.  5.75 mm radius brand screws were placed bilaterally at L3-L4  and L5.  Final images reveal good position of the cages and the hardware at the proper operative level with normal alignment of the spine.  Wound was then irrigated.  Transverse processes and residual facets were decorticated.  Morselized autograft was packed posterior laterally.  Short segment titanium rod was then placed over the screws at L3-4 and 5.  Locking caps placed over the screws.  Locking caps then engaged with construct under compression.  Each cage was then packed with graft done demineralized bone fibers.  Gelfoam was placed over the laminotomy defects.  Vancomycin powder placed the deep wound space.  Wounds then closed in layers with Vicryl sutures.  Steri-Strips and sterile dressing were applied.  No apparent complications.  Patient tolerated the procedure well and he returns to the recovery room postop.

## 2022-09-01 NOTE — Anesthesia Preprocedure Evaluation (Addendum)
Anesthesia Evaluation  Patient identified by MRN, date of birth, ID band Patient awake    Reviewed: Allergy & Precautions, NPO status , Patient's Chart, lab work & pertinent test results  Airway Mallampati: III  TM Distance: >3 FB Neck ROM: Full    Dental  (+) Dental Advisory Given, Partial Upper, Partial Lower   Pulmonary Current Smoker and Patient abstained from smoking.   Pulmonary exam normal breath sounds clear to auscultation       Cardiovascular hypertension, Pt. on medications Normal cardiovascular exam Rhythm:Regular Rate:Normal     Neuro/Psych Lumbar stenosis  Neuromuscular disease CVA    GI/Hepatic ,GERD  ,,(+)     substance abuse (Remote use)  cocaine use  Endo/Other  diabetes, Type 2, Oral Hypoglycemic Agents    Renal/GU negative Renal ROS   Prostate cancer     Musculoskeletal negative musculoskeletal ROS (+)    Abdominal   Peds  Hematology negative hematology ROS (+)   Anesthesia Other Findings Day of surgery medications reviewed with the patient.  Reproductive/Obstetrics                             Anesthesia Physical Anesthesia Plan  ASA: 3  Anesthesia Plan: General   Post-op Pain Management: Tylenol PO (pre-op)*   Induction: Intravenous  PONV Risk Score and Plan: 2 and Dexamethasone and Ondansetron  Airway Management Planned: Oral ETT  Additional Equipment:   Intra-op Plan:   Post-operative Plan: Extubation in OR  Informed Consent: I have reviewed the patients History and Physical, chart, labs and discussed the procedure including the risks, benefits and alternatives for the proposed anesthesia with the patient or authorized representative who has indicated his/her understanding and acceptance.     Dental advisory given  Plan Discussed with: CRNA  Anesthesia Plan Comments:         Anesthesia Quick Evaluation

## 2022-09-01 NOTE — Transfer of Care (Signed)
Immediate Anesthesia Transfer of Care Note  Patient: Theodore Wood  Procedure(s) Performed: LUMBAR FOUR-FIVE POSTERIOR LUMBAR INTERBODY FUSION WITH EXTENTION TO LUMBAR THREE -TRANSFORAMINAL LUMBAR INTERBODY FUSION (Spine Lumbar)  Patient Location: PACU  Anesthesia Type:General  Level of Consciousness: awake, alert  and oriented  Airway & Oxygen Therapy: Patient Spontanous Breathing and Patient connected to nasal cannula oxygen  Post-op Assessment: Report given to RN, Post -op Vital signs reviewed and stable and Patient moving all extremities X 4  Post vital signs: Reviewed and stable  Last Vitals:  Vitals Value Taken Time  BP 136/86 09/01/22 1425  Temp 36.5 C 09/01/22 1424  Pulse 99 09/01/22 1429  Resp 13 09/01/22 1429  SpO2 100 % 09/01/22 1429  Vitals shown include unvalidated device data.  Last Pain:  Vitals:   09/01/22 0839  TempSrc: Oral      Patients Stated Pain Goal: 1 (40/34/74 2595)  Complications: No notable events documented.

## 2022-09-01 NOTE — Brief Op Note (Signed)
09/01/2022  2:05 PM  PATIENT:  Molinda Bailiff  71 y.o. male  PRE-OPERATIVE DIAGNOSIS:  Stenosis  POST-OPERATIVE DIAGNOSIS:  Stenosis  PROCEDURE:  Procedure(s): LUMBAR FOUR-FIVE POSTERIOR LUMBAR INTERBODY FUSION WITH EXTENTION TO LUMBAR THREE (N/A)  SURGEON:  Surgeon(s) and Role:    * Earnie Larsson, MD - Primary  PHYSICIAN ASSISTANT:   ASSISTANTSMearl Latin   ANESTHESIA:   general  EBL:  150 mL   BLOOD ADMINISTERED:none  DRAINS: none   LOCAL MEDICATIONS USED:  MARCAINE     SPECIMEN:  No Specimen  DISPOSITION OF SPECIMEN:  N/A  COUNTS:  YES  TOURNIQUET:  * No tourniquets in log *  DICTATION: .Dragon Dictation  PLAN OF CARE: Admit for overnight observation  PATIENT DISPOSITION:  PACU - hemodynamically stable.   Delay start of Pharmacological VTE agent (>24hrs) due to surgical blood loss or risk of bleeding: yes

## 2022-09-01 NOTE — Anesthesia Procedure Notes (Addendum)
Procedure Name: Intubation Date/Time: 09/01/2022 11:29 AM  Performed by: Glynda Jaeger, CRNAPre-anesthesia Checklist: Patient identified, Patient being monitored, Timeout performed, Emergency Drugs available and Suction available Patient Re-evaluated:Patient Re-evaluated prior to induction Oxygen Delivery Method: Circle System Utilized Preoxygenation: Pre-oxygenation with 100% oxygen Induction Type: IV induction Ventilation: Mask ventilation without difficulty Laryngoscope Size: Mac and 4 Grade View: Grade I Tube type: Oral Tube size: 7.5 mm Number of attempts: 1 Airway Equipment and Method: Stylet Placement Confirmation: ETT inserted through vocal cords under direct vision, positive ETCO2 and breath sounds checked- equal and bilateral Secured at: 23 cm Tube secured with: Tape Dental Injury: Teeth and Oropharynx as per pre-operative assessment

## 2022-09-01 NOTE — Anesthesia Postprocedure Evaluation (Signed)
Anesthesia Post Note  Patient: Theodore Wood  Procedure(s) Performed: LUMBAR FOUR-FIVE POSTERIOR LUMBAR INTERBODY FUSION WITH EXTENTION TO LUMBAR THREE -TRANSFORAMINAL LUMBAR INTERBODY FUSION (Spine Lumbar)     Patient location during evaluation: PACU Anesthesia Type: General Level of consciousness: awake and alert Pain management: pain level controlled Vital Signs Assessment: post-procedure vital signs reviewed and stable Respiratory status: spontaneous breathing, nonlabored ventilation, respiratory function stable and patient connected to nasal cannula oxygen Cardiovascular status: blood pressure returned to baseline and stable Postop Assessment: no apparent nausea or vomiting Anesthetic complications: no   No notable events documented.  Last Vitals:  Vitals:   09/01/22 1551 09/01/22 1925  BP: (!) 140/80 (!) 167/88  Pulse: 86 96  Resp: 17 18  Temp:  37.4 C  SpO2: 96% 100%    Last Pain:  Vitals:   09/01/22 1925  TempSrc: Oral  PainSc:                  Santa Lighter

## 2022-09-01 NOTE — H&P (Signed)
Theodore Wood is an 71 y.o. male.   Chief Complaint: Back pain HPI: 71 year old male with progressive lower back pain with radiation to both lower extremities failed conservative management.  Work-up demonstrates evidence of significant multilevel lumbar degeneration worse at the L4-5 level where he has severe central, lateral recess and foraminal stenosis.  The patient has failed all of his conservative management.  He presents now for decompression and fusion L4-5 in hopes of improving his symptoms.  Past Medical History:  Diagnosis Date   Angina    Diabetes mellitus without complication (HCC)    GERD (gastroesophageal reflux disease)    High cholesterol    Hypertension    Prostate cancer [name]     Past Surgical History:  Procedure Laterality Date   CARDIAC CATHETERIZATION  1990's   PROSTATE BIOPSY      Family History  Problem Relation Age of Onset   Stroke Mother    Stroke Father    Stroke Brother    Cancer Maternal Aunt        unknown type   Social History:  reports that he has been smoking cigarettes. He has a 15.00 pack-year smoking history. He has never used smokeless tobacco. He reports that he does not currently use alcohol. He reports that he does not currently use drugs.  Allergies:  Allergies  Allergen Reactions   Lipitor [Atorvastatin] Swelling    Lip swelling, no airway constriction    Zestril [Lisinopril] Swelling    Angioedema    Medications Prior to Admission  Medication Sig Dispense Refill   aspirin 81 MG EC tablet Take 1 tablet (81 mg total) by mouth daily. 30 tablet 1   cetirizine (ZYRTEC) 10 MG tablet Take 10 mg by mouth daily as needed for allergies.     DULoxetine (CYMBALTA) 60 MG capsule Take 60 mg by mouth daily as needed (pain).     empagliflozin (JARDIANCE) 25 MG TABS tablet Take 12.5 mg by mouth daily.     fluticasone (FLONASE) 50 MCG/ACT nasal spray Place 1 spray into both nostrils daily.     gabapentin (NEURONTIN) 600 MG tablet Take 600  mg by mouth 4 (four) times daily as needed (pain,neuropathy).     hydrochlorothiazide (HYDRODIURIL) 25 MG tablet Take 25 mg by mouth daily.     hydroxypropyl methylcellulose / hypromellose (ISOPTO TEARS / GONIOVISC) 2.5 % ophthalmic solution Place 1 drop into both eyes daily as needed for dry eyes.     latanoprost (XALATAN) 0.005 % ophthalmic solution Place 1 drop into both eyes at bedtime.     lidocaine (LIDODERM) 5 % Place 1 patch onto the skin daily as needed (pain).     losartan (COZAAR) 100 MG tablet Take 100 mg by mouth daily.     metFORMIN (GLUCOPHAGE-XR) 500 MG 24 hr tablet Take 500 mg by mouth 2 (two) times daily.     NIFEdipine (ADALAT CC) 30 MG 24 hr tablet Take 30 mg by mouth daily.     nitroGLYCERIN (NITROSTAT) 0.3 MG SL tablet Place 1 tablet (0.3 mg total) under the tongue every 5 (five) minutes as needed for chest pain. 30 tablet 12   rosuvastatin (CRESTOR) 40 MG tablet Take 40 mg by mouth daily.     sildenafil (VIAGRA) 100 MG tablet Take 50 mg by mouth daily as needed for erectile dysfunction.     blood glucose meter kit and supplies KIT Dispense based on patient and insurance preference. Use up to four times daily as directed. (FOR  ICD-9 250.00, 250.01). (Patient taking differently: 1 each by Other route See admin instructions. Dispense based on patient and insurance preference. Use up to four times daily as directed. (FOR ICD-9 250.00, 250.01).) 1 each 0   insulin aspart (NOVOLOG) 100 UNIT/ML injection Inject 12 Units into the skin 3 (three) times daily before meals. (Patient not taking: Reported on 08/28/2022) 10.8 mL 0   insulin detemir (LEVEMIR) 100 UNIT/ML FlexPen Inject 30 Units into the skin at bedtime. (Patient not taking: Reported on 06/24/2020) 15 mL 11   lisinopril (ZESTRIL) 10 MG tablet Take 1 tablet (10 mg total) by mouth daily. (Patient not taking: Reported on 08/28/2022) 30 tablet 0    Results for orders placed or performed during the hospital encounter of 09/01/22 (from  the past 48 hour(s))  Glucose, capillary     Status: None   Collection Time: 09/01/22  8:36 AM  Result Value Ref Range   Glucose-Capillary 80 70 - 99 mg/dL    Comment: Glucose reference range applies only to samples taken after fasting for at least 8 hours.  Glucose, capillary     Status: None   Collection Time: 09/01/22 10:33 AM  Result Value Ref Range   Glucose-Capillary 72 70 - 99 mg/dL    Comment: Glucose reference range applies only to samples taken after fasting for at least 8 hours.   Comment 1 Notify RN    No results found.  Pertinent items noted in HPI and remainder of comprehensive ROS otherwise negative.  Blood pressure (!) 167/89, pulse 64, temperature 98 F (36.7 C), temperature source Oral, resp. rate 17, height _0  (1.676 m), weight 74.1 kg, SpO2 96 %.  Patient is awake and alert.  He is oriented and appropriate.  Speech is fluent.  Judgment insight are intact.  Cranial nerve function normal bilateral.  Motor examination 5/5 bilaterally except patient with significant left-sided dorsiflexion weakness graded at 4-5.  Sensory examination nonfocal.  Deep tendon reflexes are hypoactive but symmetric.  No evidence of long track signs.  Examination head ears eyes nose and throat is unremarked.  Chest and abdomen are benign.  Extremities free from injury or deformity. Assessment/Plan L4-5 severe central and neuroforaminal stenosis with back pain and radiculopathy failing conservative management.  Plan bilateral L4-5 decompressive laminotomies and foraminotomies followed by posterior lumbar to my fusion utilizing interbody cages, local harvested autograft, and augmented with posterior lateral arthrodesis utilizing nonsegmental pedicle screw fixation and local autografting.  Risks and benefits been explained.  Patient wishes to proceed.  Mallie Mussel A Dejion Grillo 09/01/2022, 11:02 AM

## 2022-09-01 NOTE — Progress Notes (Signed)
Orthopedic Tech Progress Note Patient Details:  Theodore Wood [birth year] [id number]  Ortho Devices Type of Ortho Device: Lumbar corsett Ortho Device/Splint Location: BACK Ortho Device/Splint Interventions: Ordered   Post Interventions Patient Tolerated: Well Instructions Provided: Care of device  Janit Pagan 09/01/2022, 5:15 PM

## 2022-09-02 DIAGNOSIS — M48061 Spinal stenosis, lumbar region without neurogenic claudication: Secondary | ICD-10-CM | POA: Diagnosis not present

## 2022-09-02 LAB — GLUCOSE, CAPILLARY: Glucose-Capillary: 105 mg/dL — ABNORMAL HIGH (ref 70–99)

## 2022-09-02 MED ORDER — CYCLOBENZAPRINE HCL 10 MG PO TABS: 10.0000 mg | ORAL_TABLET | Freq: Three times a day (TID) | ORAL | 0 refills | Status: AC | PRN

## 2022-09-02 MED ORDER — OXYCODONE HCL 10 MG PO TABS: 10.0000 mg | ORAL_TABLET | ORAL | 0 refills | Status: AC | PRN

## 2022-09-02 NOTE — Progress Notes (Signed)
Patient alert and oriented, mae's well, voiding adequate amount of urine, swallowing without difficulty, no c/o pain at time of discharge. Patient discharged home with family. Script and discharged instructions given to patient. Patient and family stated understanding of instructions given. Patient has an appointment with Dr. Pool in 2 weeks 

## 2022-09-02 NOTE — Evaluation (Signed)
Physical Therapy Evaluation Patient Details Name: Theodore Wood MRN: [id number] DOB: 1951-05-16 Today's Date: 09/02/2022  History of Present Illness  54 male s/p 09/01/22 L4-5 PLIF with extention to L3 TLIF PMH angina, DM, HTN, prostate CA, smoker current  Clinical Impression  Patient evaluated by Physical Therapy with no further acute PT needs identified. Pt admitted s/p procedure listed above. Pt denies radicular symptoms to BLE's or numbness/tingling. Pt demonstrates gait abnormalities, impaired standing balance, and decreased activity tolerance. Pt ambulating 200 ft with a walker and negotiated 10 steps with a railing. Overall, demonstrates improved posture, balance and gait speed with assistive device vs without. Education provided regarding spinal precautions, activity recommendations, car transfer technique and appropriate DME. All education has been completed and the patient has no further questions. See below for any follow-up Physical Therapy or equipment needs. PT is signing off. Thank you for this referral.      Recommendations for follow up therapy are one component of a multi-disciplinary discharge planning process, led by the attending physician.  Recommendations may be updated based on patient status, additional functional criteria and insurance authorization.  Follow Up Recommendations Outpatient PT      Assistance Recommended at Discharge PRN  Patient can return home with the following  Assistance with cooking/housework;Assist for transportation;Help with stairs or ramp for entrance    Equipment Recommendations Rolling walker (2 wheels)  Recommendations for Other Services       Functional Status Assessment Patient has had a recent decline in their functional status and demonstrates the ability to make significant improvements in function in a reasonable and predictable amount of time.     Precautions / Restrictions Precautions Precautions: Back Precaution Booklet  Issued: Yes (comment) Precaution Comments: back handout provided and reviewed for adls Required Braces or Orthoses: Spinal Brace Spinal Brace: Lumbar corset;Applied in sitting position Restrictions Weight Bearing Restrictions: No      Mobility  Bed Mobility               General bed mobility comments: OOB in chair    Transfers Overall transfer level: Needs assistance Equipment used: Rolling walker (2 wheels), None Transfers: Sit to/from Stand Sit to Stand: Supervision           General transfer comment: No physical assist required. Increased time/effort to power up. Cues for scooting forward to edge of recliner    Ambulation/Gait Ambulation/Gait assistance: Supervision Gait Distance (Feet): 200 Feet Assistive device: Rolling walker (2 wheels), None Gait Pattern/deviations: Step-through pattern, Decreased stride length, Wide base of support Gait velocity: decreased     General Gait Details: Cues provided for glute activation, upright posture and walker proximity. Pt with improvement in gait speed and balance with use of RW in comparison to without.  Stairs Stairs: Yes Stairs assistance: Supervision Stair Management: One rail Left Number of Stairs: 10 General stair comments: cues for step by step  Wheelchair Mobility    Modified Rankin (Stroke Patients Only)       Balance Overall balance assessment: Mild deficits observed, not formally tested                                           Pertinent Vitals/Pain Pain Assessment Pain Assessment: Faces Faces Pain Scale: Hurts a little bit Pain Location: throat Pain Descriptors / Indicators: Discomfort, Grimacing Pain Intervention(s): Monitored during session    Home Living Family/patient expects  to be discharged to:: Private residence Living Arrangements: Other relatives (brother ( retired)) Available Help at Discharge: Family Type of Home: House Home Access: Stairs to enter Entrance  Stairs-Rails: Can reach both Entrance Stairs-Number of Steps: 5 (has 2 steps and crossing uneven ground to dog lot to feed/ water the dog)   Home Layout: One level Home Equipment: Grab bars - toilet;Grab bars - tub/shower;Hand held shower head;Tub bench Additional Comments: has dog in outside lot, has glasses and dentures. pt reports he does not wear them like he should    Prior Function Prior Level of Function : Independent/Modified Independent (reports helps a friend work on cars sometimes)               ADLs Comments: retired     Journalist, newspaper   Dominant Hand: Right    Extremity/Trunk Assessment   Upper Extremity Assessment Upper Extremity Assessment: Overall WFL for tasks assessed    Lower Extremity Assessment Lower Extremity Assessment: RLE deficits/detail;LLE deficits/detail RLE Deficits / Details: Strength 5/5 LLE Deficits / Details: Strength 5/5    Cervical / Trunk Assessment Cervical / Trunk Assessment: Back Surgery  Communication   Communication: No difficulties  Cognition Arousal/Alertness: Awake/alert Behavior During Therapy: WFL for tasks assessed/performed Overall Cognitive Status: Impaired/Different from baseline Area of Impairment: Memory                     Memory: Decreased recall of precautions         General Comments: pt needs reinforcement of precautions with adls.        General Comments      Exercises     Assessment/Plan    PT Assessment All further PT needs can be met in the next venue of care  PT Problem List Decreased strength;Decreased activity tolerance;Decreased balance;Decreased mobility;Pain       PT Treatment Interventions      PT Goals (Current goals can be found in the Care Plan section)  Acute Rehab PT Goals Patient Stated Goal: less pain PT Goal Formulation: All assessment and education complete, DC therapy    Frequency       Co-evaluation               AM-PAC PT "6 Clicks" Mobility   Outcome Measure Help needed turning from your back to your side while in a flat bed without using bedrails?: None Help needed moving from lying on your back to sitting on the side of a flat bed without using bedrails?: A Little Help needed moving to and from a bed to a chair (including a wheelchair)?: A Little Help needed standing up from a chair using your arms (e.g., wheelchair or bedside chair)?: A Little Help needed to walk in hospital room?: A Little Help needed climbing 3-5 steps with a railing? : A Little 6 Click Score: 19    End of Session Equipment Utilized During Treatment: Gait belt;Back brace Activity Tolerance: Patient tolerated treatment well Patient left: in chair;with call bell/phone within reach Nurse Communication: Mobility status PT Visit Diagnosis: Difficulty in walking, not elsewhere classified (R26.2);Pain Pain - part of body:  (back)    Time: 0354-6568 PT Time Calculation (min) (ACUTE ONLY): 14 min   Charges:   PT Evaluation $PT Eval Low Complexity: [place], PT, DPT Acute Rehabilitation Services Office [phone]   Deno Etienne 09/02/2022, 9:46 AM

## 2022-09-02 NOTE — Evaluation (Signed)
Occupational Therapy Evaluation Patient Details Name: Theodore Wood MRN: [id number] DOB: 1951/01/03 Today's Date: 09/02/2022   History of Present Illness 24 male s/p 09/01/22 L4-5 PLIF with extention to L3 TLIF PMH angina, DM, HTN, prostate CA, smoker current   Clinical Impression   Patient is s/p PLIF L4-5 with extension to L3 surgery resulting in functional limitations due to the deficits listed below (see OT problem list). Pt currently benefits from reinforcement of adls with back precautions. Pt encouraged to use RW for upright posture, decrease fall risk and adhering to back precautions.  Patient will benefit from skilled OT acutely to increase independence and safety with ADLS to allow discharge outpatient. Pt has to ride the bus to appointments so the closest to bus stop would be most appropriate.        Recommendations for follow up therapy are one component of a multi-disciplinary discharge planning process, led by the attending physician.  Recommendations may be updated based on patient status, additional functional criteria and insurance authorization.   Follow Up Recommendations  Outpatient OT    Assistance Recommended at Discharge Set up Supervision/Assistance  Patient can return home with the following A little help with bathing/dressing/bathroom;Assist for transportation    Functional Status Assessment  Patient has had a recent decline in their functional status and demonstrates the ability to make significant improvements in function in a reasonable and predictable amount of time.  Equipment Recommendations  Other (comment) (RW)    Recommendations for Other Services       Precautions / Restrictions Precautions Precautions: Back Precaution Booklet Issued: Yes (comment) Precaution Comments: back handout provided and reviewed for adls Required Braces or Orthoses: Spinal Brace Spinal Brace: Lumbar corset;Applied in sitting position      Mobility Bed  Mobility Overal bed mobility: Modified Independent                  Transfers Overall transfer level: Needs assistance Equipment used: Rolling walker (2 wheels) Transfers: Sit to/from Stand Sit to Stand: Min guard           General transfer comment: requires increased time to power up and cues for hand placement. pt with increased flexed posture      Balance Overall balance assessment: Mild deficits observed, not formally tested                                         ADL either performed or assessed with clinical judgement   ADL Overall ADL's : Needs assistance/impaired Eating/Feeding: Independent   Grooming: Wash/dry hands;Modified independent   Upper Body Bathing: Min guard;Sitting   Lower Body Bathing: Min guard;Adhering to back precautions;With adaptive equipment;Sit to/from stand Lower Body Bathing Details (indicate cue type and reason): will requires AE for safety due to inability to bend knees into figure 4 Upper Body Dressing : Min guard Upper Body Dressing Details (indicate cue type and reason): educated on don doff of back brace. Brace was set to XL fit and modified this session to proper fit of L Lower Body Dressing: Min guard;With adaptive equipment;Adhering to back precautions Lower Body Dressing Details (indicate cue type and reason): required reacher and shoe horn. pt verbalized plan to buy one at [employer] Toilet Transfer: Min guard Toilet Transfer Details (indicate cue type and reason): reaching for environmental supports exiting the bathroom. pt could benefit from DME  Functional mobility during ADLs: Min guard;Rolling walker (2 wheels) General ADL Comments: pt introduced to RW due to reaching for environmental supports and forward flexed posture. PT notified and further assess     Vision Baseline Vision/History: 1 Wears glasses Ability to See in Adequate Light: 2 Moderately impaired Vision Assessment?: Yes Eye  Alignment: Within Functional Limits Ocular Range of Motion: Within Functional Limits Alignment/Gaze Preference: Within Defined Limits Convergence: Impaired (comment) Additional Comments: pt noted to have decrease convergence. pt reports at home having double vision only when he is smoking.     Perception     Praxis      Pertinent Vitals/Pain Pain Assessment Pain Assessment: Faces Faces Pain Scale: Hurts a little bit Pain Location: throat Pain Descriptors / Indicators: Discomfort, Grimacing Pain Intervention(s): Monitored during session, Premedicated before session, Repositioned     Hand Dominance Right   Extremity/Trunk Assessment Upper Extremity Assessment Upper Extremity Assessment: Overall WFL for tasks assessed   Lower Extremity Assessment Lower Extremity Assessment: Defer to PT evaluation   Cervical / Trunk Assessment Cervical / Trunk Assessment: Back Surgery   Communication Communication Communication: No difficulties   Cognition Arousal/Alertness: Awake/alert Behavior During Therapy: WFL for tasks assessed/performed Overall Cognitive Status: Impaired/Different from baseline Area of Impairment: Memory                     Memory: Decreased recall of precautions         General Comments: pt needs reinforcement of precautions with adls.     General Comments       Exercises     Shoulder Instructions      Home Living Family/patient expects to be discharged to:: Private residence Living Arrangements: Other relatives (brother ( retired)) Available Help at Discharge: Family Type of Home: House Home Access: Stairs to enter Technical brewer of Steps: 5 (has 2 steps and crossing uneven ground to dog lot to feed/ water the dog) Entrance Stairs-Rails: Can reach both Home Layout: One level     Bathroom Shower/Tub: Teacher, early years/pre: Standard     Home Equipment: Grab bars - toilet;Grab bars - tub/shower;Hand held shower  head;Tub bench   Additional Comments: has dog in outside lot, has glasses and dentures. pt reports he does not wear them like he should      Prior Functioning/Environment Prior Level of Function : Independent/Modified Independent (reports helps a friend work on cars sometimes)               ADLs Comments: retired        Secretary/administrator Problem List: Decreased activity tolerance;Impaired balance (sitting and/or standing);Decreased safety awareness      OT Treatment/Interventions: Self-care/ADL training;DME and/or AE instruction;Therapeutic activities;Patient/family education;Balance training    OT Goals(Current goals can be found in the care plan section) Acute Rehab OT Goals Patient Stated Goal: to be able to work on cars with neighbor friend again OT Goal Formulation: With patient Time For Goal Achievement: 09/16/22 Potential to Achieve Goals: Good  OT Frequency: Min 2X/week    Co-evaluation              AM-PAC OT "6 Clicks" Daily Activity     Outcome Measure Help from another person eating meals?: None Help from another person taking care of personal grooming?: A Little Help from another person toileting, which includes using toliet, bedpan, or urinal?: A Little Help from another person bathing (including washing, rinsing, drying)?: A Lot Help from another person to put on  and taking off regular upper body clothing?: A Little Help from another person to put on and taking off regular lower body clothing?: A Lot 6 Click Score: 17   End of Session Equipment Utilized During Treatment: Back brace;Rolling walker (2 wheels) Nurse Communication: Mobility status;Precautions  Activity Tolerance: Patient tolerated treatment well Patient left: in chair;with call bell/phone within reach (setup with remaining breakfast)  OT Visit Diagnosis: Unsteadiness on feet (R26.81);Muscle weakness (generalized) (M62.81)                Time: 7395-8441 OT Time Calculation (min): 30 min Charges:   OT General Charges $OT Visit: 1 Visit OT Evaluation $OT Eval Moderate Complexity: 1 Mod OT Treatments $Self Care/Home Management : 8-22 mins   Brynn, OTR/L  Acute Rehabilitation Services Office: [phone] .   Jeri Modena 09/02/2022, 9:10 AM

## 2022-09-02 NOTE — Discharge Summary (Signed)
Physician Discharge Summary  Patient ID: MONTEZ CUDA MRN: [id number] DOB/AGE: 1951/05/12 71 y.o.  Admit date: 09/01/2022 Discharge date: 09/02/2022  Admission Diagnoses:  Discharge Diagnoses:  Principal Problem:   Degenerative spondylolisthesis   Discharged Condition: good  Hospital Course: Patient admitted to the hospital where he underwent uncomplicated lumbar decompression and fusion surgery.  Postoperatively doing well.  No back or radicular pain.  Standing ambulating and voiding without difficulty.  Ready for discharge home.  Consults:   Significant Diagnostic Studies:   Treatments:   Discharge Exam: Blood pressure 126/79, pulse 100, temperature 100.2 F (37.9 C), temperature source Oral, resp. rate 16, height '5\' 6"'  (1.676 m), weight 74.1 kg, SpO2 98 %. Awake and alert.  Oriented and appropriate.  Motor and sensory function intact.  Wound clean and dry.  Chest and abdomen benign.  Disposition: Discharge disposition: 01-Home or Self Care        Allergies as of 09/02/2022       Reactions   Lipitor [atorvastatin] Swelling   Lip swelling, no airway constriction    Zestril [lisinopril] Swelling   Angioedema        Medication List     TAKE these medications    aspirin EC 81 MG tablet Take 1 tablet (81 mg total) by mouth daily.   blood glucose meter kit and supplies Kit Dispense based on patient and insurance preference. Use up to four times daily as directed. (FOR ICD-9 250.00, 250.01). What changed:  how much to take how to take this when to take this   cetirizine 10 MG tablet Commonly known as: ZYRTEC Take 10 mg by mouth daily as needed for allergies.   cyclobenzaprine 10 MG tablet Commonly known as: FLEXERIL Take 1 tablet (10 mg total) by mouth 3 (three) times daily as needed for muscle spasms.   DULoxetine 60 MG capsule Commonly known as: CYMBALTA Take 60 mg by mouth daily as needed (pain).   empagliflozin 25 MG Tabs tablet Commonly  known as: JARDIANCE Take 12.5 mg by mouth daily.   fluticasone 50 MCG/ACT nasal spray Commonly known as: FLONASE Place 1 spray into both nostrils daily.   gabapentin 600 MG tablet Commonly known as: NEURONTIN Take 600 mg by mouth 4 (four) times daily as needed (pain,neuropathy).   hydrochlorothiazide 25 MG tablet Commonly known as: HYDRODIURIL Take 25 mg by mouth daily.   hydroxypropyl methylcellulose / hypromellose 2.5 % ophthalmic solution Commonly known as: ISOPTO TEARS / GONIOVISC Place 1 drop into both eyes daily as needed for dry eyes.   insulin aspart 100 UNIT/ML injection Commonly known as: NovoLOG Inject 12 Units into the skin 3 (three) times daily before meals.   insulin detemir 100 UNIT/ML FlexPen Commonly known as: LEVEMIR Inject 30 Units into the skin at bedtime.   latanoprost 0.005 % ophthalmic solution Commonly known as: XALATAN Place 1 drop into both eyes at bedtime.   lidocaine 5 % Commonly known as: LIDODERM Place 1 patch onto the skin daily as needed (pain).   lisinopril 10 MG tablet Commonly known as: ZESTRIL Take 1 tablet (10 mg total) by mouth daily.   losartan 100 MG tablet Commonly known as: COZAAR Take 100 mg by mouth daily.   metFORMIN 500 MG 24 hr tablet Commonly known as: GLUCOPHAGE-XR Take 500 mg by mouth 2 (two) times daily.   NIFEdipine 30 MG 24 hr tablet Commonly known as: ADALAT CC Take 30 mg by mouth daily.   nitroGLYCERIN 0.3 MG SL tablet Commonly known as:  NITROSTAT Place 1 tablet (0.3 mg total) under the tongue every 5 (five) minutes as needed for chest pain.   Oxycodone HCl 10 MG Tabs Take 1 tablet (10 mg total) by mouth every 3 (three) hours as needed for severe pain ((score 7 to 10)).   rosuvastatin 40 MG tablet Commonly known as: CRESTOR Take 40 mg by mouth daily.   sildenafil 100 MG tablet Commonly known as: VIAGRA Take 50 mg by mouth daily as needed for erectile dysfunction.               Durable  Medical Equipment  (From admission, onward)           Start     Ordered   09/01/22 1602  DME Walker rolling  Once       Question:  Patient needs a walker to treat with the following condition  Answer:  Degenerative spondylolisthesis   09/01/22 1601   09/01/22 1602  DME 3 n 1  Once        09/01/22 1601             Signed: Mallie Mussel A Kierria Feigenbaum 09/02/2022, 9:03 AM

## 2022-09-02 NOTE — Discharge Instructions (Addendum)
Wound Care Keep incision covered and dry for three days.   Do not put any creams, lotions, or ointments on incision. Leave steri-strips on back.  They will fall off by themselves. Activity Walk each and every day, increasing distance each day. No lifting greater than 5 lbs.  Avoid excessive neck motion. No driving for 2 weeks; may ride as a passenger locally. If provided with back brace, wear when out of bed.  It is not necessary to wear brace in bed. Diet Resume your normal diet.  Return to Work Will be discussed at you follow up appointment. Call Your Doctor If Any of These Occur Redness, drainage, or swelling at the wound.  Temperature greater than 101 degrees. Severe pain not relieved by pain medication. Incision starts to come apart. Follow Up Appt Call [phone]) for problems.  If you have any hardware placed in your spine, you will need an x-ray before your appointment.  

## 2022-09-03 MED FILL — Sodium Chloride IV Soln 0.9%: INTRAVENOUS | Qty: 1000 | Status: AC

## 2022-09-03 MED FILL — Heparin Sodium (Porcine) Inj 1000 Unit/ML: INTRAMUSCULAR | Qty: 30 | Status: AC

## 2023-03-24 ENCOUNTER — Other Ambulatory Visit: Payer: Self-pay | Admitting: Student

## 2023-03-24 DIAGNOSIS — F17210 Nicotine dependence, cigarettes, uncomplicated: Secondary | ICD-10-CM

## 2023-05-06 ENCOUNTER — Ambulatory Visit
Admission: RE | Admit: 2023-05-06 | Discharge: 2023-05-06 | Disposition: A | Discharge status: Home / Self Care | Payer: 59 | Source: Ambulatory Visit | Attending: Student | Admitting: Student

## 2023-05-06 DIAGNOSIS — F17210 Nicotine dependence, cigarettes, uncomplicated: Secondary | ICD-10-CM

## 2023-06-28 NOTE — Therapy (Incomplete)
OUTPATIENT PHYSICAL THERAPY THORACOLUMBAR EVALUATION   Patient Name: Theodore Wood MRN: [id number] DOB:February 08, 1951, 72 y.o., male Today's Date: 06/30/2023  END OF SESSION:  PT End of Session - 06/30/23 0903     Visit Number 1    Number of Visits 15    Date for PT Re-Evaluation 08/25/23    Authorization Type [facility]    Authorization Time Period Auth dates 05/26/2023 - 12/08/2023    Authorization - Visit Number 1    Authorization - Number of Visits 15    PT Start Time 0830    PT Stop Time 0900    PT Time Calculation (min) 30 min    Activity Tolerance Patient tolerated treatment well    Behavior During Therapy [facility] for tasks assessed/performed             Past Medical History:  Diagnosis Date   Angina    Diabetes mellitus without complication (HCC)    GERD (gastroesophageal reflux disease)    High cholesterol    Hypertension    Prostate cancer [facility])    Past Surgical History:  Procedure Laterality Date   CARDIAC CATHETERIZATION  1990's   PROSTATE BIOPSY     Patient Active Problem List   Diagnosis Date Noted   Degenerative spondylolisthesis 09/01/2022   Diplopia 03/14/2020   CVA (cerebral vascular accident) (HCC) 03/13/2020   GERD (gastroesophageal reflux disease)    Diabetic ketoacidosis without coma associated with type 2 diabetes mellitus (HCC)    DM (diabetes mellitus) (HCC)    Severe hyperglycemia due to diabetes mellitus (HCC) 02/13/2020   Hypertriglyceridemia 02/13/2020   Malignant neoplasm of prostate (HCC) 12/12/2018   Abnormal cardiac function test 01/14/2016   Abnormal EKG    Hyperlipidemia 01/11/2016   Tobacco abuse 01/11/2016   Epigastric pain 01/11/2016   Gynecomastia, male 01/11/2016   AKI (acute kidney injury) (HCC) 01/11/2016   Atypical chest pain 01/11/2016   Statin myopathy 02/04/2012   Chest pain 02/03/2012   Rhabdomyolysis 02/03/2012   HTN (hypertension) 02/03/2012   Alcohol abuse 02/03/2012   Cocaine abuse (HCC) 02/03/2012    PCP:  Antonieta Pert, MD  REFERRING PROVIDER: Floreen Comber, NP   REFERRING DIAG: [phone] (ICD-10-CM) - Spondylosis without myelopathy or radiculopathy, lumbar region   Rationale for Evaluation and Treatment: Rehabilitation  THERAPY DIAG:  Other low back pain - Plan: PT plan of care cert/re-cert  Muscle weakness (generalized) - Plan: PT plan of care cert/re-cert  Other abnormalities of gait and mobility - Plan: PT plan of care cert/re-cert  ONSET DATE: Chronic  SUBJECTIVE:  SUBJECTIVE STATEMENT: Pt presents to PT with reports of chronic LBP and discomfort. Past history of lumbar fusion in 2023, notes this surgery resolved his LE symptoms but he has had continued pain in lower back, especially with forward bending and lifting. Denies bowel/bladder changes or saddle anesthesia. Would like to decrease pain with yard work and other daily activities.   PERTINENT HISTORY:  L3-5 lumbar fusion, HTN, DM II, CVA  PAIN:  Are you having pain?  Yes: NPRS scale: 3/10 Worst: 8/10 Pain location: lower back Pain description: stiff, dull ache Aggravating factors: forward bending, lifting Relieving factors: resting  PRECAUTIONS: None  RED FLAGS: None   WEIGHT BEARING RESTRICTIONS: No  FALLS:  Has patient fallen in last 6 months? No  LIVING ENVIRONMENT: Lives with: lives with their family Lives in: House/apartment  OCCUPATION: Retired Hotel manager  PLOF: Independent  PATIENT GOALS: Would like to decrease pain with yard work and other daily activities  OBJECTIVE:   DIAGNOSTIC FINDINGS:  See imaging  PATIENT SURVEYS:  FOTO: 63% function; 64% predicted   COGNITION: Overall cognitive status: Within functional limits for tasks assessed     SENSATION: Light touch: Impaired - bilateral feet  decreased due to past hx of frostbite and peripheral neuropathy  MUSCLE LENGTH: Hamstrings: Right 35 deg; Left 32 deg  POSTURE: rounded shoulders and forward head  PALPATION: TTP to bilateral lumbar paraspinals  LUMBAR ROM:   AROM eval  Flexion Decreased with pain  Extension Decreased with pain  Right lateral flexion   Left lateral flexion   Right rotation   Left rotation    (Blank rows = not tested)  LOWER EXTREMITY MMT:    MMT Right eval Left eval  Hip flexion 5/5 5/5  Hip extension    Hip abduction 3+/5 3+/5  Hip adduction    Hip internal rotation    Hip external rotation    Knee flexion 5/5 5/5  Knee extension 5/5 5/5  Ankle dorsiflexion    Ankle plantarflexion    Ankle inversion    Ankle eversion     (Blank rows = not tested)  LUMBAR SPECIAL TESTS:  Straight leg raise test: Negative and Slump test: Negative  FUNCTIONAL TESTS:  30 Second Sit to Stand: 9 reps  GAIT: Distance walked: 12ft Assistive device utilized: None Level of assistance: Complete Independence Comments: trunk flexed  TREATMENT: [facility] Adult PT Treatment:                                                DATE: 06/30/2023 Therapeutic Exercise: Supine hamstring stretch with strap x 30" each Bridge x 10 Supine clamshell x 10 GTB Squating to table x 10 (focus on decrease trunk flexion)  PATIENT EDUCATION:  Education details: eval findings, FOTO, HEP, POC Person educated: Patient Education method: Explanation, Demonstration, and Handouts Education comprehension: verbalized understanding and returned demonstration  HOME EXERCISE PROGRAM: Access Code: [email or web address] URL: [email or web address] Date: 06/30/2023 Prepared by: Edwinna Areola  Exercises - Supine Hamstring Stretch with Strap  - 2 x daily - 7 x weekly - 2 reps - 30 sec hold - Supine Bridge  - 1 x daily - 7 x weekly - 2-3 sets - 10 reps - Hooklying Clamshell with Resistance  - 1 x daily - 7 x weekly - 2-3 sets - 10 reps -  green band hold  ASSESSMENT:  CLINICAL IMPRESSION: Patient  is a 72 y.o. M who was seen today for physical therapy evaluation and treatment for chronic LBP. Physical findings are consistent with referring provider impression, as pt demonstrates decrease in core and proixmal hip strength and overall functional mobility. FOTO score shows he subjectively is operating below PLOF. Pt would benefit from skilled PT services working on improving strength and mobility to decrease pain.    OBJECTIVE IMPAIRMENTS: decreased activity tolerance, decreased balance, decreased mobility, difficulty walking, decreased ROM, decreased strength, and pain.   ACTIVITY LIMITATIONS: lifting, bending, standing, squatting, stairs, and locomotion level  PARTICIPATION LIMITATIONS: driving, shopping, community activity, and yard work  PERSONAL FACTORS: Time since onset of injury/illness/exacerbation and 3+ comorbidities: L3-5 lumbar fusion, HTN, DM II, CVA  are also affecting patient's functional outcome.   REHAB POTENTIAL: Excellent  CLINICAL DECISION MAKING: Stable/uncomplicated  EVALUATION COMPLEXITY: Low   GOALS: Goals reviewed with patient? No  SHORT TERM GOALS: Target date: 07/21/2023   Pt will be compliant and knowledgeable with initial HEP for improved comfort and carryover Baseline: initial HEP given  Goal status: INITIAL  2.  Pt will self report lower back pain no greater than 6/10 for improved comfort and functional ability Baseline: 8/10 at worst Goal status: INITIAL   LONG TERM GOALS: Target date: 08/25/2023   Pt will improve FOTO function score to no less than 64% as proxy for functional improvement Baseline: 63% function Goal status: INITIAL   2.  Pt will self report lower back pain no greater than 3/10 for improved comfort and functional ability Baseline: 8/10 at worst Goal status: INITIAL   3.  Pt will increase 30 Second Sit to Stand rep count to no less than 12 reps for improved balance,  strength, and functional mobility Baseline: 9 reps  Goal status: INITIAL   4.  Pt will improve bilateral hip abductor MMT to no less than 5/5 for improved functional mobility and decrease pain Baseline: 3+/5 Goal status: INITIAL  5.  Pt will be able to squat with hands to floor without increase in LBP for improved lifting mechanics and decreased pain Baseline: unable Goal status: INITIAL   PLAN:  PT FREQUENCY: 2x/week  PT DURATION: 8 weeks  PLANNED INTERVENTIONS: Therapeutic exercises, Therapeutic activity, Neuromuscular re-education, Balance training, Gait training, Patient/Family education, Self Care, Joint mobilization, Dry Needling, Electrical stimulation, Cryotherapy, Moist heat, Vasopneumatic device, Manual therapy, and Re-evaluation.  PLAN FOR NEXT SESSION: assess HEP response, neutral spine core, proximal hip strength, lifting mechanics, TPDN   Eloy End, PT 06/30/2023, 9:12 AM

## 2023-06-30 ENCOUNTER — Other Ambulatory Visit: Payer: Self-pay

## 2023-06-30 ENCOUNTER — Ambulatory Visit: Payer: 59 | Attending: Student

## 2023-06-30 DIAGNOSIS — M5459 Other low back pain: Secondary | ICD-10-CM | POA: Diagnosis present

## 2023-06-30 DIAGNOSIS — R2689 Other abnormalities of gait and mobility: Secondary | ICD-10-CM | POA: Insufficient documentation

## 2023-06-30 DIAGNOSIS — M6281 Muscle weakness (generalized): Secondary | ICD-10-CM | POA: Diagnosis present

## 2023-07-06 ENCOUNTER — Ambulatory Visit: Payer: 59

## 2023-07-06 DIAGNOSIS — M5459 Other low back pain: Secondary | ICD-10-CM | POA: Diagnosis not present

## 2023-07-06 DIAGNOSIS — M6281 Muscle weakness (generalized): Secondary | ICD-10-CM

## 2023-07-06 DIAGNOSIS — R2689 Other abnormalities of gait and mobility: Secondary | ICD-10-CM

## 2023-07-06 NOTE — Therapy (Signed)
OUTPATIENT PHYSICAL THERAPY TREATMENT NOTE   Patient Name: Theodore Wood MRN: [id number] DOB:1951-03-06, 72 y.o., male Today's Date: 07/06/2023  END OF SESSION:  PT End of Session - 07/06/23 0908     Visit Number 2    Number of Visits 15    Date for PT Re-Evaluation 08/25/23    Authorization Type [facility]    Authorization Time Period Auth dates 05/26/2023 - 12/08/2023    Authorization - Visit Number 2    Authorization - Number of Visits 15    PT Start Time 0915    PT Stop Time 0955    PT Time Calculation (min) 40 min    Activity Tolerance Patient tolerated treatment well    Behavior During Therapy [facility] for tasks assessed/performed              Past Medical History:  Diagnosis Date   Angina    Diabetes mellitus without complication (HCC)    GERD (gastroesophageal reflux disease)    High cholesterol    Hypertension    Prostate cancer [facility])    Past Surgical History:  Procedure Laterality Date   CARDIAC CATHETERIZATION  1990's   PROSTATE BIOPSY     Patient Active Problem List   Diagnosis Date Noted   Degenerative spondylolisthesis 09/01/2022   Diplopia 03/14/2020   CVA (cerebral vascular accident) (HCC) 03/13/2020   GERD (gastroesophageal reflux disease)    Diabetic ketoacidosis without coma associated with type 2 diabetes mellitus (HCC)    DM (diabetes mellitus) (HCC)    Severe hyperglycemia due to diabetes mellitus (HCC) 02/13/2020   Hypertriglyceridemia 02/13/2020   Malignant neoplasm of prostate (HCC) 12/12/2018   Abnormal cardiac function test 01/14/2016   Abnormal EKG    Hyperlipidemia 01/11/2016   Tobacco abuse 01/11/2016   Epigastric pain 01/11/2016   Gynecomastia, male 01/11/2016   AKI (acute kidney injury) (HCC) 01/11/2016   Atypical chest pain 01/11/2016   Statin myopathy 02/04/2012   Chest pain 02/03/2012   Rhabdomyolysis 02/03/2012   HTN (hypertension) 02/03/2012   Alcohol abuse 02/03/2012   Cocaine abuse (HCC) 02/03/2012    PCP: Antonieta Pert, MD  REFERRING PROVIDER: Floreen Comber, NP   REFERRING DIAG: [phone] (ICD-10-CM) - Spondylosis without myelopathy or radiculopathy, lumbar region   Rationale for Evaluation and Treatment: Rehabilitation  THERAPY DIAG:  Other low back pain  Muscle weakness (generalized)  Other abnormalities of gait and mobility  ONSET DATE: Chronic  SUBJECTIVE:  SUBJECTIVE STATEMENT: Patient reports continued lower back pain and reports HEP compliance.   PERTINENT HISTORY:  L3-5 lumbar fusion, HTN, DM II, CVA  PAIN:  Are you having pain?  Yes: NPRS scale: 7/10 Worst: 8/10 Pain location: lower back Pain description: stiff, dull ache Aggravating factors: forward bending, lifting Relieving factors: resting  PRECAUTIONS: None  RED FLAGS: None   WEIGHT BEARING RESTRICTIONS: No  FALLS:  Has patient fallen in last 6 months? No  LIVING ENVIRONMENT: Lives with: lives with their family Lives in: House/apartment  OCCUPATION: Retired Hotel manager  PLOF: Independent  PATIENT GOALS: Would like to decrease pain with yard work and other daily activities  OBJECTIVE:   DIAGNOSTIC FINDINGS:  See imaging  PATIENT SURVEYS:  FOTO: 63% function; 64% predicted   COGNITION: Overall cognitive status: Within functional limits for tasks assessed     SENSATION: Light touch: Impaired - bilateral feet decreased due to past hx of frostbite and peripheral neuropathy  MUSCLE LENGTH: Hamstrings: Right 35 deg; Left 32 deg  POSTURE: rounded shoulders and forward head  PALPATION: TTP to bilateral lumbar paraspinals  LUMBAR ROM:   AROM eval  Flexion Decreased with pain  Extension Decreased with pain  Right lateral flexion   Left lateral flexion   Right rotation   Left rotation    (Blank rows = not  tested)  LOWER EXTREMITY MMT:    MMT Right eval Left eval  Hip flexion 5/5 5/5  Hip extension    Hip abduction 3+/5 3+/5  Hip adduction    Hip internal rotation    Hip external rotation    Knee flexion 5/5 5/5  Knee extension 5/5 5/5  Ankle dorsiflexion    Ankle plantarflexion    Ankle inversion    Ankle eversion     (Blank rows = not tested)  LUMBAR SPECIAL TESTS:  Straight leg raise test: Negative and Slump test: Negative  FUNCTIONAL TESTS:  30 Second Sit to Stand: 9 reps  GAIT: Distance walked: 57ft Assistive device utilized: None Level of assistance: Complete Independence Comments: trunk flexed  TREATMENT: [facility] Adult PT Treatment:                                                DATE: 07/06/23 Therapeutic Exercise: Nustep level 5 x 5 mins Sidestepping at counter x4 laps Standing hip abduction 2x10 BIL Extension at counter 2x10  Step ups lateral 4" 2x10 BIL Seated active hamstring stretch 2x30" BIL Bridges 2x10 Supine hamstring stretch with strap 2x30" BIL LTR x10 BIL Sidelying open books x10 BIL STS x10   [facility] Adult PT Treatment:                                                DATE: 06/30/2023 Therapeutic Exercise: Supine hamstring stretch with strap x 30" each Bridge x 10 Supine clamshell x 10 GTB Squating to table x 10 (focus on decrease trunk flexion)  PATIENT EDUCATION:  Education details: eval findings, FOTO, HEP, POC Person educated: Patient Education method: Explanation, Demonstration, and Handouts Education comprehension: verbalized understanding and returned demonstration  HOME EXERCISE PROGRAM: Access Code: [email or web address] URL: [email or web address] Date: 06/30/2023 Prepared by: Edwinna Areola  Exercises - Supine Hamstring Stretch with Strap  - 2 x  daily - 7 x weekly - 2 reps - 30 sec hold - Supine Bridge  - 1 x daily - 7 x weekly - 2-3 sets - 10 reps - Hooklying Clamshell with Resistance  - 1 x daily - 7 x weekly - 2-3 sets - 10  reps - green band hold  ASSESSMENT:  CLINICAL IMPRESSION: Patient presents to first follow up PT session reporting continued lower back pain and describes tightness. Session today focused on proximal hip and core strengthening as well as lumbar mobility. He has most difficulty with hamstring stretch and sit to stands, needing UE support to perform. Patient continues to benefit from skilled PT services and should be progressed as able to improve functional independence.    OBJECTIVE IMPAIRMENTS: decreased activity tolerance, decreased balance, decreased mobility, difficulty walking, decreased ROM, decreased strength, and pain.   ACTIVITY LIMITATIONS: lifting, bending, standing, squatting, stairs, and locomotion level  PARTICIPATION LIMITATIONS: driving, shopping, community activity, and yard work  PERSONAL FACTORS: Time since onset of injury/illness/exacerbation and 3+ comorbidities: L3-5 lumbar fusion, HTN, DM II, CVA  are also affecting patient's functional outcome.   REHAB POTENTIAL: Excellent  CLINICAL DECISION MAKING: Stable/uncomplicated  EVALUATION COMPLEXITY: Low   GOALS: Goals reviewed with patient? No  SHORT TERM GOALS: Target date: 07/21/2023   Pt will be compliant and knowledgeable with initial HEP for improved comfort and carryover Baseline: initial HEP given  Goal status: INITIAL  2.  Pt will self report lower back pain no greater than 6/10 for improved comfort and functional ability Baseline: 8/10 at worst Goal status: INITIAL   LONG TERM GOALS: Target date: 08/25/2023   Pt will improve FOTO function score to no less than 64% as proxy for functional improvement Baseline: 63% function Goal status: INITIAL   2.  Pt will self report lower back pain no greater than 3/10 for improved comfort and functional ability Baseline: 8/10 at worst Goal status: INITIAL   3.  Pt will increase 30 Second Sit to Stand rep count to no less than 12 reps for improved balance,  strength, and functional mobility Baseline: 9 reps  Goal status: INITIAL   4.  Pt will improve bilateral hip abductor MMT to no less than 5/5 for improved functional mobility and decrease pain Baseline: 3+/5 Goal status: INITIAL  5.  Pt will be able to squat with hands to floor without increase in LBP for improved lifting mechanics and decreased pain Baseline: unable Goal status: INITIAL   PLAN:  PT FREQUENCY: 2x/week  PT DURATION: 8 weeks  PLANNED INTERVENTIONS: Therapeutic exercises, Therapeutic activity, Neuromuscular re-education, Balance training, Gait training, Patient/Family education, Self Care, Joint mobilization, Dry Needling, Electrical stimulation, Cryotherapy, Moist heat, Vasopneumatic device, Manual therapy, and Re-evaluation.  PLAN FOR NEXT SESSION: assess HEP response, neutral spine core, proximal hip strength, lifting mechanics, TPDN   Berta Minor, PTA 07/06/2023, 9:09 AM

## 2023-07-08 ENCOUNTER — Ambulatory Visit: Payer: 59

## 2023-07-12 ENCOUNTER — Ambulatory Visit: Payer: 59

## 2023-07-12 DIAGNOSIS — M5459 Other low back pain: Secondary | ICD-10-CM

## 2023-07-12 DIAGNOSIS — R2689 Other abnormalities of gait and mobility: Secondary | ICD-10-CM

## 2023-07-12 DIAGNOSIS — M6281 Muscle weakness (generalized): Secondary | ICD-10-CM

## 2023-07-12 NOTE — Therapy (Signed)
OUTPATIENT PHYSICAL THERAPY TREATMENT NOTE   Patient Name: Theodore Wood MRN: [id number] DOB:1951/11/02, 72 y.o., male Today's Date: 07/12/2023  END OF SESSION:  PT End of Session - 07/12/23 1033     Visit Number 3    Number of Visits 15    Date for PT Re-Evaluation 08/25/23    Authorization Type [place]    Authorization Time Period Auth dates 05/26/2023 - 12/08/2023    Authorization - Number of Visits 15    PT Start Time 1045    Activity Tolerance Patient tolerated treatment well    Behavior During Therapy [facility] for tasks assessed/performed               Past Medical History:  Diagnosis Date   Angina    Diabetes mellitus without complication (HCC)    GERD (gastroesophageal reflux disease)    High cholesterol    Hypertension    Prostate cancer (HCC)    Past Surgical History:  Procedure Laterality Date   CARDIAC CATHETERIZATION  1990's   PROSTATE BIOPSY     Patient Active Problem List   Diagnosis Date Noted   Degenerative spondylolisthesis 09/01/2022   Diplopia 03/14/2020   CVA (cerebral vascular accident) (HCC) 03/13/2020   GERD (gastroesophageal reflux disease)    Diabetic ketoacidosis without coma associated with type 2 diabetes mellitus (HCC)    DM (diabetes mellitus) (HCC)    Severe hyperglycemia due to diabetes mellitus (HCC) 02/13/2020   Hypertriglyceridemia 02/13/2020   Malignant neoplasm of prostate (HCC) 12/12/2018   Abnormal cardiac function test 01/14/2016   Abnormal EKG    Hyperlipidemia 01/11/2016   Tobacco abuse 01/11/2016   Epigastric pain 01/11/2016   Gynecomastia, male 01/11/2016   AKI (acute kidney injury) (HCC) 01/11/2016   Atypical chest pain 01/11/2016   Statin myopathy 02/04/2012   Chest pain 02/03/2012   Rhabdomyolysis 02/03/2012   HTN (hypertension) 02/03/2012   Alcohol abuse 02/03/2012   Cocaine abuse (HCC) 02/03/2012    PCP: Antonieta Pert, MD  REFERRING PROVIDER: Floreen Comber, NP   REFERRING DIAG: [phone]  (ICD-10-CM) - Spondylosis without myelopathy or radiculopathy, lumbar region   Rationale for Evaluation and Treatment: Rehabilitation  THERAPY DIAG:  Other low back pain  Muscle weakness (generalized)  Other abnormalities of gait and mobility  ONSET DATE: Chronic  SUBJECTIVE:                                                                                                                                                                                           SUBJECTIVE STATEMENT: Pt presents to PT with reports of continued lower back discomfort, notes he felt  better after last session. Has been compliant with HEP.   PERTINENT HISTORY:  L3-5 lumbar fusion, HTN, DM II, CVA  PAIN:  Are you having pain?  Yes: NPRS scale: 5/10 Worst: 8/10 Pain location: lower back Pain description: stiff, dull ache Aggravating factors: forward bending, lifting Relieving factors: resting  PRECAUTIONS: None  RED FLAGS: None   WEIGHT BEARING RESTRICTIONS: No  FALLS:  Has patient fallen in last 6 months? No  LIVING ENVIRONMENT: Lives with: lives with their family Lives in: House/apartment  OCCUPATION: Retired Hotel manager  PLOF: Independent  PATIENT GOALS: Would like to decrease pain with yard work and other daily activities  OBJECTIVE:   DIAGNOSTIC FINDINGS:  See imaging  PATIENT SURVEYS:  FOTO: 63% function; 64% predicted   COGNITION: Overall cognitive status: Within functional limits for tasks assessed     SENSATION: Light touch: Impaired - bilateral feet decreased due to past hx of frostbite and peripheral neuropathy  MUSCLE LENGTH: Hamstrings: Right 35 deg; Left 32 deg  POSTURE: rounded shoulders and forward head  PALPATION: TTP to bilateral lumbar paraspinals  LUMBAR ROM:   AROM eval  Flexion Decreased with pain  Extension Decreased with pain  Right lateral flexion   Left lateral flexion   Right rotation   Left rotation    (Blank rows = not tested)  LOWER  EXTREMITY MMT:    MMT Right eval Left eval  Hip flexion 5/5 5/5  Hip extension    Hip abduction 3+/5 3+/5  Hip adduction    Hip internal rotation    Hip external rotation    Knee flexion 5/5 5/5  Knee extension 5/5 5/5  Ankle dorsiflexion    Ankle plantarflexion    Ankle inversion    Ankle eversion     (Blank rows = not tested)  LUMBAR SPECIAL TESTS:  Straight leg raise test: Negative and Slump test: Negative  FUNCTIONAL TESTS:  30 Second Sit to Stand: 9 reps  GAIT: Distance walked: 30ft Assistive device utilized: None Level of assistance: Complete Independence Comments: trunk flexed  TREATMENT: [facility] Adult PT Treatment:                                                DATE: 07/12/23 Therapeutic Exercise: Nustep level 5 x 5 min while taking subjective LTR x 10 BIL Supine clamshell 2x15 GTB Bridge with band 2x10 GTB Supine hamstring stretch with strap x 60"  Supine pilates SLR 2x10 each Seated hamstring stretch x 30" each Lateral walk YTB x 3 laps at counter Standing hip ext 2x10 YTB  [facility] Adult PT Treatment:                                                DATE: 07/06/23 Therapeutic Exercise: Nustep level 5 x 5 mins Sidestepping at counter x4 laps Standing hip abduction 2x10 BIL Extension at counter 2x10  Step ups lateral 4" 2x10 BIL Seated active hamstring stretch 2x30" BIL Bridges 2x10 Supine hamstring stretch with strap 2x30" BIL LTR x10 BIL Sidelying open books x10 BIL STS x10  [facility] Adult PT Treatment:  DATE: 06/30/2023 Therapeutic Exercise: Supine hamstring stretch with strap x 30" each Bridge x 10 Supine clamshell x 10 GTB Squating to table x 10 (focus on decrease trunk flexion)  PATIENT EDUCATION:  Education details: eval findings, FOTO, HEP, POC Person educated: Patient Education method: Explanation, Demonstration, and Handouts Education comprehension: verbalized understanding and returned  demonstration  HOME EXERCISE PROGRAM: Access Code: [email or web address] URL: [email or web address] Date: 07/12/2023 Prepared by: Edwinna Areola  Exercises - Supine Hamstring Stretch with Strap  - 2 x daily - 7 x weekly - 2 reps - 30 sec hold - Supine Bridge  - 1 x daily - 7 x weekly - 2-3 sets - 10 reps - Hooklying Clamshell with Resistance  - 1 x daily - 7 x weekly - 2-3 sets - 10 reps - green band hold - Side Stepping with Resistance at Ankles and Counter Support  - 3 x weekly - 3 reps - yellow band hold - Standing Hip Extension with Resistance at Ankles and Counter Support  - 3 x weekly - 2 sets - 10 reps - yellow band hold  ASSESSMENT:  CLINICAL IMPRESSION: Pt was able to complete all prescribed exercises with no adverse effect. Therapy today continued to focus on core and proximal hip strengthening for decreasing pain. HEP updated for continued hip strengthening. Will continue to progress as able per POC.   OBJECTIVE IMPAIRMENTS: decreased activity tolerance, decreased balance, decreased mobility, difficulty walking, decreased ROM, decreased strength, and pain.   ACTIVITY LIMITATIONS: lifting, bending, standing, squatting, stairs, and locomotion level  PARTICIPATION LIMITATIONS: driving, shopping, community activity, and yard work  PERSONAL FACTORS: Time since onset of injury/illness/exacerbation and 3+ comorbidities: L3-5 lumbar fusion, HTN, DM II, CVA  are also affecting patient's functional outcome.    GOALS: Goals reviewed with patient? No  SHORT TERM GOALS: Target date: 07/21/2023   Pt will be compliant and knowledgeable with initial HEP for improved comfort and carryover Baseline: initial HEP given  Goal status: INITIAL  2.  Pt will self report lower back pain no greater than 6/10 for improved comfort and functional ability Baseline: 8/10 at worst Goal status: INITIAL   LONG TERM GOALS: Target date: 08/25/2023   Pt will improve FOTO function score to no less than  64% as proxy for functional improvement Baseline: 63% function Goal status: INITIAL   2.  Pt will self report lower back pain no greater than 3/10 for improved comfort and functional ability Baseline: 8/10 at worst Goal status: INITIAL   3.  Pt will increase 30 Second Sit to Stand rep count to no less than 12 reps for improved balance, strength, and functional mobility Baseline: 9 reps  Goal status: INITIAL   4.  Pt will improve bilateral hip abductor MMT to no less than 5/5 for improved functional mobility and decrease pain Baseline: 3+/5 Goal status: INITIAL  5.  Pt will be able to squat with hands to floor without increase in LBP for improved lifting mechanics and decreased pain Baseline: unable Goal status: INITIAL   PLAN:  PT FREQUENCY: 2x/week  PT DURATION: 8 weeks  PLANNED INTERVENTIONS: Therapeutic exercises, Therapeutic activity, Neuromuscular re-education, Balance training, Gait training, Patient/Family education, Self Care, Joint mobilization, Dry Needling, Electrical stimulation, Cryotherapy, Moist heat, Vasopneumatic device, Manual therapy, and Re-evaluation.  PLAN FOR NEXT SESSION: assess HEP response, neutral spine core, proximal hip strength, lifting mechanics, TPDN   Eloy End, PT 07/12/2023, 10:34 AM

## 2023-07-14 ENCOUNTER — Ambulatory Visit: Payer: 59

## 2023-07-20 ENCOUNTER — Ambulatory Visit: Payer: 59 | Attending: Internal Medicine

## 2023-07-20 DIAGNOSIS — M5459 Other low back pain: Secondary | ICD-10-CM | POA: Diagnosis present

## 2023-07-20 DIAGNOSIS — M6281 Muscle weakness (generalized): Secondary | ICD-10-CM

## 2023-07-20 DIAGNOSIS — R2689 Other abnormalities of gait and mobility: Secondary | ICD-10-CM

## 2023-07-20 NOTE — Therapy (Signed)
OUTPATIENT PHYSICAL THERAPY TREATMENT NOTE   Patient Name: Theodore Wood MRN: [id number] DOB:1951-06-03, 72 y.o., male Today's Date: 07/20/2023  END OF SESSION:  PT End of Session - 07/20/23 1034     Visit Number 4    Number of Visits 15    Date for PT Re-Evaluation 08/25/23    Authorization Type [facility]    Authorization Time Period Auth dates 05/26/2023 - 12/08/2023    Authorization - Visit Number 3    Authorization - Number of Visits 15    PT Start Time 1045    PT Stop Time 1125    PT Time Calculation (min) 40 min    Activity Tolerance Patient tolerated treatment well    Behavior During Therapy WFL for tasks assessed/performed             Past Medical History:  Diagnosis Date   Angina    Diabetes mellitus without complication (HCC)    GERD (gastroesophageal reflux disease)    High cholesterol    Hypertension    Prostate cancer [facility])    Past Surgical History:  Procedure Laterality Date   CARDIAC CATHETERIZATION  1990's   PROSTATE BIOPSY     Patient Active Problem List   Diagnosis Date Noted   Degenerative spondylolisthesis 09/01/2022   Diplopia 03/14/2020   CVA (cerebral vascular accident) (HCC) 03/13/2020   GERD (gastroesophageal reflux disease)    Diabetic ketoacidosis without coma associated with type 2 diabetes mellitus (HCC)    DM (diabetes mellitus) (HCC)    Severe hyperglycemia due to diabetes mellitus (HCC) 02/13/2020   Hypertriglyceridemia 02/13/2020   Malignant neoplasm of prostate (HCC) 12/12/2018   Abnormal cardiac function test 01/14/2016   Abnormal EKG    Hyperlipidemia 01/11/2016   Tobacco abuse 01/11/2016   Epigastric pain 01/11/2016   Gynecomastia, male 01/11/2016   AKI (acute kidney injury) (HCC) 01/11/2016   Atypical chest pain 01/11/2016   Statin myopathy 02/04/2012   Chest pain 02/03/2012   Rhabdomyolysis 02/03/2012   HTN (hypertension) 02/03/2012   Alcohol abuse 02/03/2012   Cocaine abuse (HCC) 02/03/2012    PCP: Antonieta Pert, MD  REFERRING PROVIDER: Floreen Comber, NP   REFERRING DIAG: [phone] (ICD-10-CM) - Spondylosis without myelopathy or radiculopathy, lumbar region   Rationale for Evaluation and Treatment: Rehabilitation  THERAPY DIAG:  Other low back pain  Muscle weakness (generalized)  Other abnormalities of gait and mobility  ONSET DATE: Chronic  SUBJECTIVE:  SUBJECTIVE STATEMENT: Patient reports continued lower back pain and HEP compliance.   PERTINENT HISTORY:  L3-5 lumbar fusion, HTN, DM II, CVA  PAIN:  Are you having pain?  Yes: NPRS scale: 5/10 Worst: 8/10 Pain location: lower back Pain description: stiff, dull ache Aggravating factors: forward bending, lifting Relieving factors: resting  PRECAUTIONS: None  RED FLAGS: None   WEIGHT BEARING RESTRICTIONS: No  FALLS:  Has patient fallen in last 6 months? No  LIVING ENVIRONMENT: Lives with: lives with their family Lives in: House/apartment  OCCUPATION: Retired Hotel manager  PLOF: Independent  PATIENT GOALS: Would like to decrease pain with yard work and other daily activities  OBJECTIVE:   DIAGNOSTIC FINDINGS:  See imaging  PATIENT SURVEYS:  FOTO: 63% function; 64% predicted   COGNITION: Overall cognitive status: Within functional limits for tasks assessed     SENSATION: Light touch: Impaired - bilateral feet decreased due to past hx of frostbite and peripheral neuropathy  MUSCLE LENGTH: Hamstrings: Right 35 deg; Left 32 deg  POSTURE: rounded shoulders and forward head  PALPATION: TTP to bilateral lumbar paraspinals  LUMBAR ROM:   AROM eval  Flexion Decreased with pain  Extension Decreased with pain  Right lateral flexion   Left lateral flexion   Right rotation   Left rotation    (Blank rows = not  tested)  LOWER EXTREMITY MMT:    MMT Right eval Left eval  Hip flexion 5/5 5/5  Hip extension    Hip abduction 3+/5 3+/5  Hip adduction    Hip internal rotation    Hip external rotation    Knee flexion 5/5 5/5  Knee extension 5/5 5/5  Ankle dorsiflexion    Ankle plantarflexion    Ankle inversion    Ankle eversion     (Blank rows = not tested)  LUMBAR SPECIAL TESTS:  Straight leg raise test: Negative and Slump test: Negative  FUNCTIONAL TESTS:  30 Second Sit to Stand: 9 reps  GAIT: Distance walked: 88ft Assistive device utilized: None Level of assistance: Complete Independence Comments: trunk flexed  TREATMENT: [facility] Adult PT Treatment:                                                DATE: 07/20/23 Therapeutic Exercise: Nustep level 5 x 5 min while taking subjective LTR x 10 BIL Supine clamshell 2x15 GTB Bridge with band x5 GTB (Rt hamstring cramp) Supine hamstring stretch with strap x 60"  Supine pilates SLR 2x10 each Seated hamstring stretch x 30" each Lateral walk YTB x 3 laps at counter Standing hip ext 2x10 YTB STS arms crossed x10   [facility] Adult PT Treatment:                                                DATE: 07/12/23 Therapeutic Exercise: Nustep level 5 x 5 min while taking subjective LTR x 10 BIL Supine clamshell 2x15 GTB Bridge with band 2x10 GTB Supine hamstring stretch with strap x 60"  Supine pilates SLR 2x10 each Seated hamstring stretch x 30" each Lateral walk YTB x 3 laps at counter Standing hip ext 2x10 YTB  [facility] Adult PT Treatment:  DATE: 07/06/23 Therapeutic Exercise: Nustep level 5 x 5 mins Sidestepping at counter x4 laps Standing hip abduction 2x10 BIL Extension at counter 2x10  Step ups lateral 4" 2x10 BIL Seated active hamstring stretch 2x30" BIL Bridges 2x10 Supine hamstring stretch with strap 2x30" BIL LTR x10 BIL Sidelying open books x10 BIL STS x10   PATIENT EDUCATION:   Education details: eval findings, FOTO, HEP, POC Person educated: Patient Education method: Explanation, Demonstration, and Handouts Education comprehension: verbalized understanding and returned demonstration  HOME EXERCISE PROGRAM: Access Code: [email or web address] URL: [email or web address] Date: 07/12/2023 Prepared by: Edwinna Areola  Exercises - Supine Hamstring Stretch with Strap  - 2 x daily - 7 x weekly - 2 reps - 30 sec hold - Supine Bridge  - 1 x daily - 7 x weekly - 2-3 sets - 10 reps - Hooklying Clamshell with Resistance  - 1 x daily - 7 x weekly - 2-3 sets - 10 reps - green band hold - Side Stepping with Resistance at Ankles and Counter Support  - 3 x weekly - 3 reps - yellow band hold - Standing Hip Extension with Resistance at Ankles and Counter Support  - 3 x weekly - 2 sets - 10 reps - yellow band hold  ASSESSMENT:  CLINICAL IMPRESSION: Patient presents to PT reporting continued lower back pain and HEP compliance. Session today continued to focus on proximal hip and core strengthening. He endorses a hamstring cramp on the Rt with bridges, terminated these early. Patient was able to tolerate all prescribed exercises with no adverse effects. Patient continues to benefit from skilled PT services and should be progressed as able to improve functional independence.    OBJECTIVE IMPAIRMENTS: decreased activity tolerance, decreased balance, decreased mobility, difficulty walking, decreased ROM, decreased strength, and pain.   ACTIVITY LIMITATIONS: lifting, bending, standing, squatting, stairs, and locomotion level  PARTICIPATION LIMITATIONS: driving, shopping, community activity, and yard work  PERSONAL FACTORS: Time since onset of injury/illness/exacerbation and 3+ comorbidities: L3-5 lumbar fusion, HTN, DM II, CVA  are also affecting patient's functional outcome.    GOALS: Goals reviewed with patient? No  SHORT TERM GOALS: Target date: 07/21/2023   Pt will be  compliant and knowledgeable with initial HEP for improved comfort and carryover Baseline: initial HEP given  Goal status: INITIAL  2.  Pt will self report lower back pain no greater than 6/10 for improved comfort and functional ability Baseline: 8/10 at worst Goal status: INITIAL   LONG TERM GOALS: Target date: 08/25/2023   Pt will improve FOTO function score to no less than 64% as proxy for functional improvement Baseline: 63% function Goal status: INITIAL   2.  Pt will self report lower back pain no greater than 3/10 for improved comfort and functional ability Baseline: 8/10 at worst Goal status: INITIAL   3.  Pt will increase 30 Second Sit to Stand rep count to no less than 12 reps for improved balance, strength, and functional mobility Baseline: 9 reps  Goal status: INITIAL   4.  Pt will improve bilateral hip abductor MMT to no less than 5/5 for improved functional mobility and decrease pain Baseline: 3+/5 Goal status: INITIAL  5.  Pt will be able to squat with hands to floor without increase in LBP for improved lifting mechanics and decreased pain Baseline: unable Goal status: INITIAL   PLAN:  PT FREQUENCY: 2x/week  PT DURATION: 8 weeks  PLANNED INTERVENTIONS: Therapeutic exercises, Therapeutic activity, Neuromuscular re-education, Balance training, Gait training, Patient/Family education, Self Care,  Joint mobilization, Dry Needling, Electrical stimulation, Cryotherapy, Moist heat, Vasopneumatic device, Manual therapy, and Re-evaluation.  PLAN FOR NEXT SESSION: assess HEP response, neutral spine core, proximal hip strength, lifting mechanics, TPDN   Berta Minor, PTA 07/20/2023, 11:30 AM

## 2023-07-22 ENCOUNTER — Ambulatory Visit: Payer: 59

## 2023-07-26 ENCOUNTER — Ambulatory Visit: Payer: 59

## 2023-07-26 DIAGNOSIS — M6281 Muscle weakness (generalized): Secondary | ICD-10-CM

## 2023-07-26 DIAGNOSIS — M5459 Other low back pain: Secondary | ICD-10-CM

## 2023-07-26 NOTE — Therapy (Signed)
OUTPATIENT PHYSICAL THERAPY TREATMENT NOTE   Patient Name: Theodore Wood MRN: [id number] DOB:11-25-1951, 72 y.o., male Today's Date: 07/26/2023  END OF SESSION:  PT End of Session - 07/26/23 0950     Visit Number 5    Number of Visits 15    Date for PT Re-Evaluation 08/25/23    Authorization Type [facility]    Authorization Time Period Auth dates 05/26/2023 - 12/08/2023    Authorization - Visit Number 5    Authorization - Number of Visits 15    PT Start Time 0957    PT Stop Time 1037    PT Time Calculation (min) 40 min    Activity Tolerance Patient tolerated treatment well    Behavior During Therapy [facility] for tasks assessed/performed              Past Medical History:  Diagnosis Date   Angina    Diabetes mellitus without complication (HCC)    GERD (gastroesophageal reflux disease)    High cholesterol    Hypertension    Prostate cancer [facility])    Past Surgical History:  Procedure Laterality Date   CARDIAC CATHETERIZATION  1990's   PROSTATE BIOPSY     Patient Active Problem List   Diagnosis Date Noted   Degenerative spondylolisthesis 09/01/2022   Diplopia 03/14/2020   CVA (cerebral vascular accident) (HCC) 03/13/2020   GERD (gastroesophageal reflux disease)    Diabetic ketoacidosis without coma associated with type 2 diabetes mellitus (HCC)    DM (diabetes mellitus) (HCC)    Severe hyperglycemia due to diabetes mellitus (HCC) 02/13/2020   Hypertriglyceridemia 02/13/2020   Malignant neoplasm of prostate (HCC) 12/12/2018   Abnormal cardiac function test 01/14/2016   Abnormal EKG    Hyperlipidemia 01/11/2016   Tobacco abuse 01/11/2016   Epigastric pain 01/11/2016   Gynecomastia, male 01/11/2016   AKI (acute kidney injury) (HCC) 01/11/2016   Atypical chest pain 01/11/2016   Statin myopathy 02/04/2012   Chest pain 02/03/2012   Rhabdomyolysis 02/03/2012   HTN (hypertension) 02/03/2012   Alcohol abuse 02/03/2012   Cocaine abuse (HCC) 02/03/2012    PCP: Antonieta Pert, MD  REFERRING PROVIDER: Floreen Comber, NP   REFERRING DIAG: [phone] (ICD-10-CM) - Spondylosis without myelopathy or radiculopathy, lumbar region   Rationale for Evaluation and Treatment: Rehabilitation  THERAPY DIAG:  Other low back pain  Muscle weakness (generalized)  ONSET DATE: Chronic  SUBJECTIVE:  SUBJECTIVE STATEMENT: Pt presents to PT with reports of continued LBP. Has been compliant with HEP with no adverse effect.   PERTINENT HISTORY:  L3-5 lumbar fusion, HTN, DM II, CVA  PAIN:  Are you having pain?  Yes: NPRS scale: 5/10 Worst: 8/10 Pain location: lower back Pain description: stiff, dull ache Aggravating factors: forward bending, lifting Relieving factors: resting  PRECAUTIONS: None  RED FLAGS: None   WEIGHT BEARING RESTRICTIONS: No  FALLS:  Has patient fallen in last 6 months? No  LIVING ENVIRONMENT: Lives with: lives with their family Lives in: House/apartment  OCCUPATION: Retired Hotel manager  PLOF: Independent  PATIENT GOALS: Would like to decrease pain with yard work and other daily activities  OBJECTIVE:   DIAGNOSTIC FINDINGS:  See imaging  PATIENT SURVEYS:  FOTO: 63% function; 64% predicted   COGNITION: Overall cognitive status: Within functional limits for tasks assessed     SENSATION: Light touch: Impaired - bilateral feet decreased due to past hx of frostbite and peripheral neuropathy  MUSCLE LENGTH: Hamstrings: Right 35 deg; Left 32 deg  POSTURE: rounded shoulders and forward head  PALPATION: TTP to bilateral lumbar paraspinals  LUMBAR ROM:   AROM eval  Flexion Decreased with pain  Extension Decreased with pain  Right lateral flexion   Left lateral flexion   Right rotation   Left rotation    (Blank rows = not  tested)  LOWER EXTREMITY MMT:    MMT Right eval Left eval  Hip flexion 5/5 5/5  Hip extension    Hip abduction 3+/5 3+/5  Hip adduction    Hip internal rotation    Hip external rotation    Knee flexion 5/5 5/5  Knee extension 5/5 5/5  Ankle dorsiflexion    Ankle plantarflexion    Ankle inversion    Ankle eversion     (Blank rows = not tested)  LUMBAR SPECIAL TESTS:  Straight leg raise test: Negative and Slump test: Negative  FUNCTIONAL TESTS:  30 Second Sit to Stand: 9 reps  GAIT: Distance walked: 17ft Assistive device utilized: None Level of assistance: Complete Independence Comments: trunk flexed  TREATMENT: [facility] Adult PT Treatment:                                                DATE: 07/26/23 Therapeutic Exercise: Nustep level 5 x 5 min while taking subjective LTR x 10 BIL Supine clamshell 2x15 blue band Bridge with ball 2x10 Supine hamstring stretch with strap 2x60"  Supine pilates SLR 2x10 each S/L hip abd 2x10 Seated hamstring stretch 2x30" each STS arms crossed x 10 - high table  [facility] Adult PT Treatment:                                                DATE: 07/20/23 Therapeutic Exercise: Nustep level 5 x 5 min while taking subjective LTR x 10 BIL Supine clamshell 2x15 GTB Bridge with band x5 GTB (Rt hamstring cramp) Supine hamstring stretch with strap x 60"  Supine pilates SLR 2x10 each Seated hamstring stretch x 30" each Lateral walk YTB x 3 laps at counter Standing hip ext 2x10 YTB STS arms crossed x10   [facility] Adult PT Treatment:  DATE: 07/12/23 Therapeutic Exercise: Nustep level 5 x 5 min while taking subjective LTR x 10 BIL Supine clamshell 2x15 GTB Bridge with band 2x10 GTB Supine hamstring stretch with strap x 60"  Supine pilates SLR 2x10 each Seated hamstring stretch x 30" each Lateral walk YTB x 3 laps at counter Standing hip ext 2x10 YTB  [facility] Adult PT Treatment:                                                 DATE: 07/06/23 Therapeutic Exercise: Nustep level 5 x 5 mins Sidestepping at counter x4 laps Standing hip abduction 2x10 BIL Extension at counter 2x10  Step ups lateral 4" 2x10 BIL Seated active hamstring stretch 2x30" BIL Bridges 2x10 Supine hamstring stretch with strap 2x30" BIL LTR x10 BIL Sidelying open books x10 BIL STS x10   PATIENT EDUCATION:  Education details: eval findings, FOTO, HEP, POC Person educated: Patient Education method: Explanation, Demonstration, and Handouts Education comprehension: verbalized understanding and returned demonstration  HOME EXERCISE PROGRAM: Access Code: [email or web address] URL: [email or web address] Date: 07/12/2023 Prepared by: Edwinna Areola  Exercises - Supine Hamstring Stretch with Strap  - 2 x daily - 7 x weekly - 2 reps - 30 sec hold - Supine Bridge  - 1 x daily - 7 x weekly - 2-3 sets - 10 reps - Hooklying Clamshell with Resistance  - 1 x daily - 7 x weekly - 2-3 sets - 10 reps - green band hold - Side Stepping with Resistance at Ankles and Counter Support  - 3 x weekly - 3 reps - yellow band hold - Standing Hip Extension with Resistance at Ankles and Counter Support  - 3 x weekly - 2 sets - 10 reps - yellow band hold  ASSESSMENT:  CLINICAL IMPRESSION: Pt was able to complete all prescribed exercises with no adverse effect. Therapy today continued to focus on core and proximal hip strengthening for decreasing pain. Will continue to progress as able per POC.    OBJECTIVE IMPAIRMENTS: decreased activity tolerance, decreased balance, decreased mobility, difficulty walking, decreased ROM, decreased strength, and pain.   ACTIVITY LIMITATIONS: lifting, bending, standing, squatting, stairs, and locomotion level  PARTICIPATION LIMITATIONS: driving, shopping, community activity, and yard work  PERSONAL FACTORS: Time since onset of injury/illness/exacerbation and 3+ comorbidities: L3-5 lumbar fusion, HTN, DM II, CVA   are also affecting patient's functional outcome.    GOALS: Goals reviewed with patient? No  SHORT TERM GOALS: Target date: 07/21/2023   Pt will be compliant and knowledgeable with initial HEP for improved comfort and carryover Baseline: initial HEP given  Goal status: INITIAL  2.  Pt will self report lower back pain no greater than 6/10 for improved comfort and functional ability Baseline: 8/10 at worst Goal status: INITIAL   LONG TERM GOALS: Target date: 08/25/2023   Pt will improve FOTO function score to no less than 64% as proxy for functional improvement Baseline: 63% function Goal status: INITIAL   2.  Pt will self report lower back pain no greater than 3/10 for improved comfort and functional ability Baseline: 8/10 at worst Goal status: INITIAL   3.  Pt will increase 30 Second Sit to Stand rep count to no less than 12 reps for improved balance, strength, and functional mobility Baseline: 9 reps  Goal status: INITIAL   4.  Pt will improve bilateral  hip abductor MMT to no less than 5/5 for improved functional mobility and decrease pain Baseline: 3+/5 Goal status: INITIAL  5.  Pt will be able to squat with hands to floor without increase in LBP for improved lifting mechanics and decreased pain Baseline: unable Goal status: INITIAL   PLAN:  PT FREQUENCY: 2x/week  PT DURATION: 8 weeks  PLANNED INTERVENTIONS: Therapeutic exercises, Therapeutic activity, Neuromuscular re-education, Balance training, Gait training, Patient/Family education, Self Care, Joint mobilization, Dry Needling, Electrical stimulation, Cryotherapy, Moist heat, Vasopneumatic device, Manual therapy, and Re-evaluation.  PLAN FOR NEXT SESSION: assess HEP response, neutral spine core, proximal hip strength, lifting mechanics, TPDN   Eloy End, PT 07/26/2023, 10:38 AM

## 2023-07-28 ENCOUNTER — Ambulatory Visit: Payer: 59

## 2023-08-06 ENCOUNTER — Ambulatory Visit: Payer: 59

## 2023-08-06 DIAGNOSIS — R2689 Other abnormalities of gait and mobility: Secondary | ICD-10-CM

## 2023-08-06 DIAGNOSIS — M6281 Muscle weakness (generalized): Secondary | ICD-10-CM

## 2023-08-06 DIAGNOSIS — M5459 Other low back pain: Secondary | ICD-10-CM | POA: Diagnosis not present

## 2023-08-06 NOTE — Therapy (Signed)
OUTPATIENT PHYSICAL THERAPY TREATMENT NOTE   Patient Name: Theodore Wood MRN: [id number] DOB:November 27, 1951, 72 y.o., male Today's Date: 08/06/2023  END OF SESSION:  PT End of Session - 08/06/23 0829     Visit Number 6    Number of Visits 15    Date for PT Re-Evaluation 08/25/23    Authorization Type [facility]    Authorization Time Period Auth dates 05/26/2023 - 12/08/2023    Authorization - Number of Visits 15    PT Start Time 0830    PT Stop Time 0910    PT Time Calculation (min) 40 min    Activity Tolerance Patient tolerated treatment well    Behavior During Therapy [facility] for tasks assessed/performed               Past Medical History:  Diagnosis Date   Angina    Diabetes mellitus without complication (HCC)    GERD (gastroesophageal reflux disease)    High cholesterol    Hypertension    Prostate cancer [facility])    Past Surgical History:  Procedure Laterality Date   CARDIAC CATHETERIZATION  1990's   PROSTATE BIOPSY     Patient Active Problem List   Diagnosis Date Noted   Degenerative spondylolisthesis 09/01/2022   Diplopia 03/14/2020   CVA (cerebral vascular accident) (HCC) 03/13/2020   GERD (gastroesophageal reflux disease)    Diabetic ketoacidosis without coma associated with type 2 diabetes mellitus (HCC)    DM (diabetes mellitus) (HCC)    Severe hyperglycemia due to diabetes mellitus (HCC) 02/13/2020   Hypertriglyceridemia 02/13/2020   Malignant neoplasm of prostate (HCC) 12/12/2018   Abnormal cardiac function test 01/14/2016   Abnormal EKG    Hyperlipidemia 01/11/2016   Tobacco abuse 01/11/2016   Epigastric pain 01/11/2016   Gynecomastia, male 01/11/2016   AKI (acute kidney injury) (HCC) 01/11/2016   Atypical chest pain 01/11/2016   Statin myopathy 02/04/2012   Chest pain 02/03/2012   Rhabdomyolysis 02/03/2012   HTN (hypertension) 02/03/2012   Alcohol abuse 02/03/2012   Cocaine abuse (HCC) 02/03/2012    PCP: Antonieta Pert, MD  REFERRING PROVIDER:  Floreen Comber, NP   REFERRING DIAG: [phone] (ICD-10-CM) - Spondylosis without myelopathy or radiculopathy, lumbar region   Rationale for Evaluation and Treatment: Rehabilitation  THERAPY DIAG:  Other low back pain  Muscle weakness (generalized)  Other abnormalities of gait and mobility  ONSET DATE: Chronic  SUBJECTIVE:  SUBJECTIVE STATEMENT: 4/10 discomfort this AM.  Symptoms onset with pprolonged sittingand bending, relieved with position changes   PERTINENT HISTORY:  L3-5 lumbar fusion, HTN, DM II, CVA  PAIN:  Are you having pain?  Yes: NPRS scale: 5/10 Worst: 8/10 Pain location: lower back Pain description: stiff, dull ache Aggravating factors: forward bending, lifting Relieving factors: resting  PRECAUTIONS: None  RED FLAGS: None   WEIGHT BEARING RESTRICTIONS: No  FALLS:  Has patient fallen in last 6 months? No  LIVING ENVIRONMENT: Lives with: lives with their family Lives in: House/apartment  OCCUPATION: Retired Hotel manager  PLOF: Independent  PATIENT GOALS: Would like to decrease pain with yard work and other daily activities  OBJECTIVE:   DIAGNOSTIC FINDINGS:  See imaging  PATIENT SURVEYS:  FOTO: 63% function; 64% predicted   COGNITION: Overall cognitive status: Within functional limits for tasks assessed     SENSATION: Light touch: Impaired - bilateral feet decreased due to past hx of frostbite and peripheral neuropathy  MUSCLE LENGTH: Hamstrings: Right 35 deg; Left 32 deg  POSTURE: rounded shoulders and forward head  PALPATION: TTP to bilateral lumbar paraspinals  LUMBAR ROM:   AROM eval  Flexion Decreased with pain  Extension Decreased with pain  Right lateral flexion   Left lateral flexion   Right rotation   Left rotation    (Blank rows = not  tested)  LOWER EXTREMITY MMT:    MMT Right eval Left eval  Hip flexion 5/5 5/5  Hip extension    Hip abduction 3+/5 3+/5  Hip adduction    Hip internal rotation    Hip external rotation    Knee flexion 5/5 5/5  Knee extension 5/5 5/5  Ankle dorsiflexion    Ankle plantarflexion    Ankle inversion    Ankle eversion     (Blank rows = not tested)  LUMBAR SPECIAL TESTS:  Straight leg raise test: Negative and Slump test: Negative  FUNCTIONAL TESTS:  30 Second Sit to Stand: 9 reps  GAIT: Distance walked: 19ft Assistive device utilized: None Level of assistance: Complete Independence Comments: trunk flexed  TREATMENT: [facility] Adult PT Treatment:                                                DATE: 08/06/23 Therapeutic Exercise: Nustep level 3 x 8 min  Seated hamstring stretch 30s ea. Seated hamstring stretch on stool 30s ea. QL stretch 30s x2 B Curl up with p-ball 15x PPT 3s 10x PPT with marching 10/10 POE 2 min STS arms crossed x 10 w/OH reach Gentle STM into extension avoiding fused levels  [facility] Adult PT Treatment:                                                DATE: 07/26/23 Therapeutic Exercise: Nustep level 5 x 5 min while taking subjective LTR x 10 BIL Supine clamshell 2x15 blue band Bridge with ball 2x10 Supine hamstring stretch with strap 2x60"  Supine pilates SLR 2x10 each S/L hip abd 2x10 Seated hamstring stretch 2x30" each STS arms crossed x 10 - high table  [facility] Adult PT Treatment:  DATE: 07/20/23 Therapeutic Exercise: Nustep level 5 x 5 min while taking subjective LTR x 10 BIL Supine clamshell 2x15 GTB Bridge with band x5 GTB (Rt hamstring cramp) Supine hamstring stretch with strap x 60"  Supine pilates SLR 2x10 each Seated hamstring stretch x 30" each Lateral walk YTB x 3 laps at counter Standing hip ext 2x10 YTB STS arms crossed x10   [facility] Adult PT Treatment:                                                 DATE: 07/12/23 Therapeutic Exercise: Nustep level 5 x 5 min while taking subjective LTR x 10 BIL Supine clamshell 2x15 GTB Bridge with band 2x10 GTB Supine hamstring stretch with strap x 60"  Supine pilates SLR 2x10 each Seated hamstring stretch x 30" each Lateral walk YTB x 3 laps at counter Standing hip ext 2x10 YTB  [facility] Adult PT Treatment:                                                DATE: 07/06/23 Therapeutic Exercise: Nustep level 5 x 5 mins Sidestepping at counter x4 laps Standing hip abduction 2x10 BIL Extension at counter 2x10  Step ups lateral 4" 2x10 BIL Seated active hamstring stretch 2x30" BIL Bridges 2x10 Supine hamstring stretch with strap 2x30" BIL LTR x10 BIL Sidelying open books x10 BIL STS x10   PATIENT EDUCATION:  Education details: eval findings, FOTO, HEP, POC Person educated: Patient Education method: Explanation, Demonstration, and Handouts Education comprehension: verbalized understanding and returned demonstration  HOME EXERCISE PROGRAM: Access Code: [email or web address] URL: [email or web address] Date: 07/12/2023 Prepared by: Edwinna Areola  Exercises - Supine Hamstring Stretch with Strap  - 2 x daily - 7 x weekly - 2 reps - 30 sec hold - Supine Bridge  - 1 x daily - 7 x weekly - 2-3 sets - 10 reps - Hooklying Clamshell with Resistance  - 1 x daily - 7 x weekly - 2-3 sets - 10 reps - green band hold - Side Stepping with Resistance at Ankles and Counter Support  - 3 x weekly - 3 reps - yellow band hold - Standing Hip Extension with Resistance at Ankles and Counter Support  - 3 x weekly - 2 sets - 10 reps - yellow band hold  ASSESSMENT:  CLINICAL IMPRESSION: Increased time on aerobic component and introduced more core and lumbar extension tasks.  Difficulty noted in isolating core musculature.  Had issues with RLE hamstring cramping.  Soft tissue restriction evident in anterior hips as well as lumbar region.   OBJECTIVE IMPAIRMENTS:  decreased activity tolerance, decreased balance, decreased mobility, difficulty walking, decreased ROM, decreased strength, and pain.   ACTIVITY LIMITATIONS: lifting, bending, standing, squatting, stairs, and locomotion level  PARTICIPATION LIMITATIONS: driving, shopping, community activity, and yard work  PERSONAL FACTORS: Time since onset of injury/illness/exacerbation and 3+ comorbidities: L3-5 lumbar fusion, HTN, DM II, CVA  are also affecting patient's functional outcome.    GOALS: Goals reviewed with patient? No  SHORT TERM GOALS: Target date: 07/21/2023   Pt will be compliant and knowledgeable with initial HEP for improved comfort and carryover Baseline: initial HEP given  Goal status: INITIAL  2.  Pt will self  report lower back pain no greater than 6/10 for improved comfort and functional ability Baseline: 8/10 at worst Goal status: INITIAL   LONG TERM GOALS: Target date: 08/25/2023   Pt will improve FOTO function score to no less than 64% as proxy for functional improvement Baseline: 63% function Goal status: INITIAL   2.  Pt will self report lower back pain no greater than 3/10 for improved comfort and functional ability Baseline: 8/10 at worst Goal status: INITIAL   3.  Pt will increase 30 Second Sit to Stand rep count to no less than 12 reps for improved balance, strength, and functional mobility Baseline: 9 reps  Goal status: INITIAL   4.  Pt will improve bilateral hip abductor MMT to no less than 5/5 for improved functional mobility and decrease pain Baseline: 3+/5 Goal status: INITIAL  5.  Pt will be able to squat with hands to floor without increase in LBP for improved lifting mechanics and decreased pain Baseline: unable Goal status: INITIAL   PLAN:  PT FREQUENCY: 2x/week  PT DURATION: 8 weeks  PLANNED INTERVENTIONS: Therapeutic exercises, Therapeutic activity, Neuromuscular re-education, Balance training, Gait training, Patient/Family education, Self  Care, Joint mobilization, Dry Needling, Electrical stimulation, Cryotherapy, Moist heat, Vasopneumatic device, Manual therapy, and Re-evaluation.  PLAN FOR NEXT SESSION: assess HEP response, neutral spine core, proximal hip strength, lifting mechanics, TPDN   Hildred Laser, PT 08/06/2023, 8:34 AM

## 2023-08-10 ENCOUNTER — Ambulatory Visit: Payer: 59 | Admitting: Physical Therapy

## 2023-08-18 ENCOUNTER — Encounter: Payer: Self-pay | Admitting: Physical Therapy

## 2023-08-18 ENCOUNTER — Ambulatory Visit: Payer: 59 | Attending: Internal Medicine | Admitting: Physical Therapy

## 2023-08-18 DIAGNOSIS — M6281 Muscle weakness (generalized): Secondary | ICD-10-CM | POA: Insufficient documentation

## 2023-08-18 DIAGNOSIS — M5459 Other low back pain: Secondary | ICD-10-CM | POA: Insufficient documentation

## 2023-08-18 DIAGNOSIS — R2689 Other abnormalities of gait and mobility: Secondary | ICD-10-CM | POA: Insufficient documentation

## 2023-08-18 NOTE — Therapy (Signed)
OUTPATIENT PHYSICAL THERAPY TREATMENT NOTE   Patient Name: Theodore Wood MRN: [id number] DOB:02/25/1951, 72 y.o., male Today's Date: 08/18/2023  END OF SESSION:  PT End of Session - 08/18/23 0717     Visit Number 7    Number of Visits 15    Date for PT Re-Evaluation 08/25/23    Authorization Type [facility]    Authorization Time Period Auth dates 05/26/2023 - 12/08/2023    Authorization - Visit Number 6    Authorization - Number of Visits 15    PT Start Time 0718    PT Stop Time 0756    PT Time Calculation (min) 38 min               Past Medical History:  Diagnosis Date   Angina    Diabetes mellitus without complication (HCC)    GERD (gastroesophageal reflux disease)    High cholesterol    Hypertension    Prostate cancer (HCC)    Past Surgical History:  Procedure Laterality Date   CARDIAC CATHETERIZATION  1990's   PROSTATE BIOPSY     Patient Active Problem List   Diagnosis Date Noted   Degenerative spondylolisthesis 09/01/2022   Diplopia 03/14/2020   CVA (cerebral vascular accident) (HCC) 03/13/2020   GERD (gastroesophageal reflux disease)    Diabetic ketoacidosis without coma associated with type 2 diabetes mellitus (HCC)    DM (diabetes mellitus) (HCC)    Severe hyperglycemia due to diabetes mellitus (HCC) 02/13/2020   Hypertriglyceridemia 02/13/2020   Malignant neoplasm of prostate (HCC) 12/12/2018   Abnormal cardiac function test 01/14/2016   Abnormal EKG    Hyperlipidemia 01/11/2016   Tobacco abuse 01/11/2016   Epigastric pain 01/11/2016   Gynecomastia, male 01/11/2016   AKI (acute kidney injury) (HCC) 01/11/2016   Atypical chest pain 01/11/2016   Statin myopathy 02/04/2012   Chest pain 02/03/2012   Rhabdomyolysis 02/03/2012   HTN (hypertension) 02/03/2012   Alcohol abuse 02/03/2012   Cocaine abuse (HCC) 02/03/2012    PCP: Antonieta Pert, MD  REFERRING PROVIDER: Floreen Comber, NP   REFERRING DIAG: [phone] (ICD-10-CM) - Spondylosis without  myelopathy or radiculopathy, lumbar region   Rationale for Evaluation and Treatment: Rehabilitation  THERAPY DIAG:  Other low back pain  Muscle weakness (generalized)  Other abnormalities of gait and mobility  ONSET DATE: Chronic  SUBJECTIVE:                                                                                                                                                                                           SUBJECTIVE STATEMENT: 5/10.  It is not bad pain. The exercises  are helping.    PERTINENT HISTORY:  L3-5 lumbar fusion, HTN, DM II, CVA  PAIN:  Are you having pain?  Yes: NPRS scale: 5/10 Worst: 8/10 Pain location: lower back Pain description: stiff, dull ache Aggravating factors: forward bending, lifting Relieving factors: resting  PRECAUTIONS: None  RED FLAGS: None   WEIGHT BEARING RESTRICTIONS: No  FALLS:  Has patient fallen in last 6 months? No  LIVING ENVIRONMENT: Lives with: lives with their family Lives in: House/apartment  OCCUPATION: Retired Hotel manager  PLOF: Independent  PATIENT GOALS: Would like to decrease pain with yard work and other daily activities  OBJECTIVE:   DIAGNOSTIC FINDINGS:  See imaging  PATIENT SURVEYS:  FOTO: 63% function; 64% predicted  08/18/23: 56%   COGNITION: Overall cognitive status: Within functional limits for tasks assessed     SENSATION: Light touch: Impaired - bilateral feet decreased due to past hx of frostbite and peripheral neuropathy  MUSCLE LENGTH: Hamstrings: Right 35 deg; Left 32 deg  POSTURE: rounded shoulders and forward head  PALPATION: TTP to bilateral lumbar paraspinals  LUMBAR ROM:   AROM eval  Flexion Decreased with pain  Extension Decreased with pain  Right lateral flexion   Left lateral flexion   Right rotation   Left rotation    (Blank rows = not tested)  LOWER EXTREMITY MMT:    MMT Right eval Left eval Right  08/18/23 Left  08/18/23  Hip flexion 5/5 5/5     Hip extension      Hip abduction 3+/5 3+/5 4- 4-  Hip adduction      Hip internal rotation      Hip external rotation      Knee flexion 5/5 5/5    Knee extension 5/5 5/5    Ankle dorsiflexion      Ankle plantarflexion      Ankle inversion      Ankle eversion       (Blank rows = not tested)  LUMBAR SPECIAL TESTS:  Straight leg raise test: Negative and Slump test: Negative  FUNCTIONAL TESTS:  30 Second Sit to Stand: 9 reps  GAIT: Distance walked: 52ft Assistive device utilized: None Level of assistance: Complete Independence Comments: trunk flexed  TREATMENT: [facility] Adult PT Treatment:                                                DATE: 08/18/23 Therapeutic Exercise: Nustep L5 UE/LE x 5 minutes  Seated lumbar flexion with ball  Hamstring stretch supine  SKTC 10 sec x 5 each LTR x 10  Therapeutic Activity: Standing Hip hinge with dowel - mod cues Hip hinge to 6 inches from floor - mod cues Able to hip hinge finger tips to floor but with increased pain    [facility] Adult PT Treatment:                                                DATE: 08/06/23 Therapeutic Exercise: Nustep level 3 x 8 min  Seated hamstring stretch 30s ea. Seated hamstring stretch on stool 30s ea. QL stretch 30s x2 B Curl up with p-ball 15x PPT 3s 10x PPT with marching 10/10 POE 2 min STS arms crossed x 10 w/OH reach Gentle STM  into extension avoiding fused levels  [facility] Adult PT Treatment:                                                DATE: 07/26/23 Therapeutic Exercise: Nustep level 5 x 5 min while taking subjective LTR x 10 BIL Supine clamshell 2x15 blue band Bridge with ball 2x10 Supine hamstring stretch with strap 2x60"  Supine pilates SLR 2x10 each S/L hip abd 2x10 Seated hamstring stretch 2x30" each STS arms crossed x 10 - high table  [facility] Adult PT Treatment:                                                DATE: 07/20/23 Therapeutic Exercise: Nustep level 5 x 5 min while taking  subjective LTR x 10 BIL Supine clamshell 2x15 GTB Bridge with band x5 GTB (Rt hamstring cramp) Supine hamstring stretch with strap x 60"  Supine pilates SLR 2x10 each Seated hamstring stretch x 30" each Lateral walk YTB x 3 laps at counter Standing hip ext 2x10 YTB STS arms crossed x10   [facility] Adult PT Treatment:                                                DATE: 07/12/23 Therapeutic Exercise: Nustep level 5 x 5 min while taking subjective LTR x 10 BIL Supine clamshell 2x15 GTB Bridge with band 2x10 GTB Supine hamstring stretch with strap x 60"  Supine pilates SLR 2x10 each Seated hamstring stretch x 30" each Lateral walk YTB x 3 laps at counter Standing hip ext 2x10 YTB  [facility] Adult PT Treatment:                                                DATE: 07/06/23 Therapeutic Exercise: Nustep level 5 x 5 mins Sidestepping at counter x4 laps Standing hip abduction 2x10 BIL Extension at counter 2x10  Step ups lateral 4" 2x10 BIL Seated active hamstring stretch 2x30" BIL Bridges 2x10 Supine hamstring stretch with strap 2x30" BIL LTR x10 BIL Sidelying open books x10 BIL STS x10   PATIENT EDUCATION:  Education details: eval findings, FOTO, HEP, POC Person educated: Patient Education method: Explanation, Demonstration, and Handouts Education comprehension: verbalized understanding and returned demonstration  HOME EXERCISE PROGRAM: Access Code: [email or web address] URL: [email or web address] Date: 07/12/2023 Prepared by: Edwinna Areola  Exercises - Supine Hamstring Stretch with Strap  - 2 x daily - 7 x weekly - 2 reps - 30 sec hold - Supine Bridge  - 1 x daily - 7 x weekly - 2-3 sets - 10 reps - Hooklying Clamshell with Resistance  - 1 x daily - 7 x weekly - 2-3 sets - 10 reps - green band hold - Side Stepping with Resistance at Ankles and Counter Support  - 3 x weekly - 3 reps - yellow band hold - Standing Hip Extension with Resistance at Ankles and Counter Support  - 3 x  weekly - 2 sets -  10 reps - yellow band hold  ASSESSMENT:  CLINICAL IMPRESSION: Pt reports compliance with HEP every other day. He reports worst pain levels are less, reaching 5/10 at worst. He has met STG#1, #2. Rechecked FOTO which indicated a decrease in function however subjectively he did not agree with the status report. Introduced Civil engineer, contracting, working hip hinge to work toward LTG. After mod cues and use of wooden dowel, pt  able to squat to touch floor but with increased LBP. Hip abduction strength improving. Pt verbalized interest in "Acupuncture" and briefly discussed TPDN. Pt verbalized interest.    OBJECTIVE IMPAIRMENTS: decreased activity tolerance, decreased balance, decreased mobility, difficulty walking, decreased ROM, decreased strength, and pain.   ACTIVITY LIMITATIONS: lifting, bending, standing, squatting, stairs, and locomotion level  PARTICIPATION LIMITATIONS: driving, shopping, community activity, and yard work  PERSONAL FACTORS: Time since onset of injury/illness/exacerbation and 3+ comorbidities: L3-5 lumbar fusion, HTN, DM II, CVA  are also affecting patient's functional outcome.    GOALS: Goals reviewed with patient? No  SHORT TERM GOALS: Target date: 07/21/2023   Pt will be compliant and knowledgeable with initial HEP for improved comfort and carryover Baseline: initial HEP given  08/18/23: Pt reports he  Goal status: MET   2.  Pt will self report lower back pain no greater than 6/10 for improved comfort and functional ability Baseline: 8/10 at worst 08/18/23: no longer reaches 8/10. 5/10 at worst  Goal status: MET    LONG TERM GOALS: Target date: 08/25/2023   Pt will improve FOTO function score to no less than 64% as proxy for functional improvement Baseline: 63% function 08/18/23: 56% (decreased at 7th visit. ) Goal status: ONGOING   2.  Pt will self report lower back pain no greater than 3/10 for improved comfort and functional ability Baseline:  8/10 at worst 08/18/23: 5/10 at worst Goal status: ONGOING   3.  Pt will increase 30 Second Sit to Stand rep count to no less than 12 reps for improved balance, strength, and functional mobility Baseline: 9 reps  Goal status: INITIAL   4.  Pt will improve bilateral hip abductor MMT to no less than 5/5 for improved functional mobility and decrease pain Baseline: 3+/5 08/18/23: 4-/5 bilateral  Goal status: ONGOING  5.  Pt will be able to squat with hands to floor without increase in LBP for improved lifting mechanics and decreased pain Baseline: unable 08/18/23:  able to squat finger tips to floor with increased back pain.  Goal status: ONGOING   PLAN:  PT FREQUENCY: 2x/week  PT DURATION: 8 weeks  PLANNED INTERVENTIONS: Therapeutic exercises, Therapeutic activity, Neuromuscular re-education, Balance training, Gait training, Patient/Family education, Self Care, Joint mobilization, Dry Needling, Electrical stimulation, Cryotherapy, Moist heat, Vasopneumatic device, Manual therapy, and Re-evaluation.  PLAN FOR NEXT SESSION: assess HEP response, neutral spine core, proximal hip strength, lifting mechanics, TPDN?   Sherrie Mustache, PTA 08/18/2023, 10:36 AM

## 2023-08-24 ENCOUNTER — Ambulatory Visit: Payer: 59 | Admitting: Physical Therapy

## 2023-08-24 ENCOUNTER — Other Ambulatory Visit: Payer: Self-pay

## 2023-08-24 ENCOUNTER — Encounter: Payer: Self-pay | Admitting: Physical Therapy

## 2023-08-24 DIAGNOSIS — M6281 Muscle weakness (generalized): Secondary | ICD-10-CM

## 2023-08-24 DIAGNOSIS — M5459 Other low back pain: Secondary | ICD-10-CM | POA: Diagnosis not present

## 2023-08-24 DIAGNOSIS — R2689 Other abnormalities of gait and mobility: Secondary | ICD-10-CM

## 2023-08-24 NOTE — Therapy (Addendum)
 OUTPATIENT PHYSICAL THERAPY TREATMENT NOTE  DISCHARGE   Patient Name: Theodore Wood MRN: [id number] DOB:1951/02/01, 72 y.o., male Today's Date: 08/24/2023   END OF SESSION:  PT End of Session - 08/24/23 0807     Visit Number 8    Number of Visits 15    Date for PT Re-Evaluation 10/19/23    Authorization Type [facility]    Authorization Time Period Auth dates 05/26/2023 - 12/08/2023    Authorization - Visit Number 7    Authorization - Number of Visits 15    PT Start Time 0800    PT Stop Time 0845    PT Time Calculation (min) 45 min    Activity Tolerance Patient tolerated treatment well    Behavior During Therapy [facility] for tasks assessed/performed                Past Medical History:  Diagnosis Date   Angina    Diabetes mellitus without complication (HCC)    GERD (gastroesophageal reflux disease)    High cholesterol    Hypertension    Prostate cancer [facility])    Past Surgical History:  Procedure Laterality Date   CARDIAC CATHETERIZATION  1990's   PROSTATE BIOPSY     Patient Active Problem List   Diagnosis Date Noted   Degenerative spondylolisthesis 09/01/2022   Diplopia 03/14/2020   CVA (cerebral vascular accident) (HCC) 03/13/2020   GERD (gastroesophageal reflux disease)    Diabetic ketoacidosis without coma associated with type 2 diabetes mellitus (HCC)    DM (diabetes mellitus) (HCC)    Severe hyperglycemia due to diabetes mellitus (HCC) 02/13/2020   Hypertriglyceridemia 02/13/2020   Malignant neoplasm of prostate (HCC) 12/12/2018   Abnormal cardiac function test 01/14/2016   Abnormal EKG    Hyperlipidemia 01/11/2016   Tobacco abuse 01/11/2016   Epigastric pain 01/11/2016   Gynecomastia, male 01/11/2016   AKI (acute kidney injury) (HCC) 01/11/2016   Atypical chest pain 01/11/2016   Statin myopathy 02/04/2012   Chest pain 02/03/2012   Rhabdomyolysis 02/03/2012   HTN (hypertension) 02/03/2012   Alcohol abuse 02/03/2012   Cocaine abuse (HCC) 02/03/2012     PCP: Antonieta Pert, MD  REFERRING PROVIDER: Floreen Comber, NP   REFERRING DIAG: [phone] (ICD-10-CM) - Spondylosis without myelopathy or radiculopathy, lumbar region   Rationale for Evaluation and Treatment: Rehabilitation  THERAPY DIAG:  Other low back pain  Muscle weakness (generalized)  Other abnormalities of gait and mobility  ONSET DATE: Chronic   SUBJECTIVE:  SUBJECTIVE STATEMENT: Patient reports he is feeling pretty good. He has been doing his exercises some. He feels PT is helping and he would like to try the needles.  PERTINENT HISTORY:  L3-5 lumbar fusion, HTN, DM II, CVA  PAIN:  Are you having pain?  Yes: NPRS scale: 5/10 Worst: 5/10 Pain location: lower back Pain description: stiff, dull ache Aggravating factors: forward bending, lifting Relieving factors: resting  PRECAUTIONS: None  RED FLAGS: None   WEIGHT BEARING RESTRICTIONS: No  FALLS:  Has patient fallen in last 6 months? No  LIVING ENVIRONMENT: Lives with: lives with their family Lives in: House/apartment  OCCUPATION: Retired Hotel manager  PLOF: Independent  PATIENT GOALS: Would like to decrease pain with yard work and other daily activities   OBJECTIVE:   PATIENT SURVEYS:  FOTO: 63% function; 64% predicted  08/18/23: 56%  08/24/2023: 63%  SENSATION: Light touch: Impaired - bilateral feet decreased due to past hx of frostbite and peripheral neuropathy  MUSCLE LENGTH: Hamstrings: Right 35 deg; Left 32 deg  POSTURE: rounded shoulders and forward head  PALPATION: TTP to bilateral lumbar paraspinals  LUMBAR ROM:   AROM eval  Flexion Decreased with pain  Extension Decreased with pain  Right lateral flexion   Left lateral flexion   Right rotation   Left rotation    (Blank rows = not  tested)  LOWER EXTREMITY MMT:    MMT Right eval Left eval Right  08/18/23 Left  08/18/23 Rt / Lt 08/24/2023  Hip flexion 5/5 5/5     Hip extension     4- / 4-  Hip abduction 3+/5 3+/5 4- 4- 4- / 4-  Hip adduction       Hip internal rotation       Hip external rotation       Knee flexion 5/5 5/5     Knee extension 5/5 5/5     Ankle dorsiflexion       Ankle plantarflexion       Ankle inversion       Ankle eversion        (Blank rows = not tested)  LUMBAR SPECIAL TESTS:  Straight leg raise test: Negative and Slump test: Negative  FUNCTIONAL TESTS:  30 Second Sit to Stand: 9 reps  08/24/2023: 9 reps  GAIT: Distance walked: 38ft Assistive device utilized: None Level of assistance: Complete Independence Comments: trunk flexed   TREATMENT: [facility] Adult PT Treatment:                                                DATE: 08/24/2023 Therapeutic Exercise: Nustep L6 x 5 min with UE/LE while taking subjective LTR x 10 each Hooklying SKTC stretch 3 x 10 sec each Piriformis stretch 2 x 20 sec each Bridge 2 x 10 Sit to stand 2 x 10 Seated hamstring stretch 3 x 20 sec Seated hip hinge with dowel x 10 Standing hip hinge with dowel x 10 Manual: Skilled palpation and monitoring of muscle tension while performing TPDN STM/TPR bilateral lumbar paraspinals and QL Trigger Point Dry Needling Treatment: Pre-treatment instruction: Patient instructed on dry needling rationale, procedures, and possible side effects including pain during treatment (achy,cramping feeling), bruising, drop of blood, lightheadedness, nausea, sweating. Patient Consent Given: Yes Education handout provided: No Muscles treated: Bilateral lumbar erector spinae  Needle size and number: .30x27mm x 2 Electrical stimulation  performed: No Parameters: N/A Treatment response/outcome: Twitch response elicited and Palpable decrease in muscle tension Post-treatment instructions: Patient instructed to expect possible mild to  moderate muscle soreness later today and/or tomorrow. Patient instructed in methods to reduce muscle soreness and to continue prescribed HEP. If patient was dry needled over the lung field, patient was instructed on signs and symptoms of pneumothorax and, however unlikely, to see immediate medical attention should they occur. Patient was also educated on signs and symptoms of infection and to seek medical attention should they occur. Patient verbalized understanding of these instructions and education.   [facility] Adult PT Treatment:                                                DATE: 08/18/23 Therapeutic Exercise: Nustep L5 UE/LE x 5 minutes  Seated lumbar flexion with ball  Hamstring stretch supine  SKTC 10 sec x 5 each LTR x 10 Therapeutic Activity: Standing Hip hinge with dowel - mod cues Hip hinge to 6 inches from floor - mod cues Able to hip hinge finger tips to floor but with increased pain  [facility] Adult PT Treatment:                                                DATE: 08/06/23 Therapeutic Exercise: Nustep level 3 x 8 min  Seated hamstring stretch 30s ea. Seated hamstring stretch on stool 30s ea. QL stretch 30s x2 B Curl up with p-ball 15x PPT 3s 10x PPT with marching 10/10 POE 2 min STS arms crossed x 10 w/OH reach Gentle STM into extension avoiding fused levels  [facility] Adult PT Treatment:                                                DATE: 07/26/23 Therapeutic Exercise: Nustep level 5 x 5 min while taking subjective LTR x 10 BIL Supine clamshell 2x15 blue band Bridge with ball 2x10 Supine hamstring stretch with strap 2x60"  Supine pilates SLR 2x10 each S/L hip abd 2x10 Seated hamstring stretch 2x30" each STS arms crossed x 10 - high table  PATIENT EDUCATION:  Education details: POC extension, HEP Person educated: Patient Education method: Explanation, Demonstration Education comprehension: verbalized understanding and returned demonstration  HOME EXERCISE PROGRAM: Access  Code: [email or web address]   ASSESSMENT:  CLINICAL IMPRESSION:  Patient tolerated therapy well with no adverse effects. Patient does report an improvement in his functional ability on FOTO this visit back to initial baseline at eval. Therapy incorporated TPDN for bilateral lumbar erector spinae, he does have history of lumbar surgery so did not needle multifidi this visit. Therapy continues to focus on improving his mobility and strengthening, and working on proper mechanics with movement such as lifting. He does require consistent cueing to avoid excessive lumbar flexion with lifting movements. No changes to HEP this visit. Patient would benefit from continued skilled PT to progress his mobility and strength in order to reduce pain and maximize functional ability.     OBJECTIVE IMPAIRMENTS: decreased activity tolerance, decreased balance, decreased mobility, difficulty walking, decreased ROM, decreased strength, and pain.  ACTIVITY LIMITATIONS: lifting, bending, standing, squatting, stairs, and locomotion level  PARTICIPATION LIMITATIONS: driving, shopping, community activity, and yard work  PERSONAL FACTORS: Time since onset of injury/illness/exacerbation and 3+ comorbidities: L3-5 lumbar fusion, HTN, DM II, CVA  are also affecting patient's functional outcome.    GOALS: Goals reviewed with patient? No  SHORT TERM GOALS: Target date: 07/21/2023   Pt will be compliant and knowledgeable with initial HEP for improved comfort and carryover Baseline: initial HEP given  08/18/23: Pt reports he  Goal status: MET   2.  Pt will self report lower back pain no greater than 6/10 for improved comfort and functional ability Baseline: 8/10 at worst 08/18/23: no longer reaches 8/10. 5/10 at worst  Goal status: MET    LONG TERM GOALS: Target date: 10/19/2023   Pt will improve FOTO function score to no less than 64% as proxy for functional improvement Baseline: 63% function 08/18/23: 56% (decreased at 7th visit.  ) 08/24/2023: 63% Goal status: ONGOING   2.  Pt will self report lower back pain no greater than 3/10 for improved comfort and functional ability Baseline: 8/10 at worst 08/18/23: 5/10 at worst 08/24/2023: 5/10 pain Goal status: ONGOING   3.  Pt will increase 30 Second Sit to Stand rep count to no less than 12 reps for improved balance, strength, and functional mobility Baseline: 9 reps  08/24/2023: 9 reps Goal status: ONGOING  4.  Pt will improve bilateral hip abductor MMT to no less than 5/5 for improved functional mobility and decrease pain Baseline: 3+/5 08/18/23: 4-/5 bilateral  08/24/2023: grossly 4-/5 MMT Goal status: ONGOING  5.  Pt will be able to squat with hands to floor without increase in LBP for improved lifting mechanics and decreased pain Baseline: unable 08/18/23:  able to squat finger tips to floor with increased back pain.  08/24/2023: increased back pain Goal status: ONGOING   PLAN: PT FREQUENCY: 1x/week  PT DURATION: 8 weeks  PLANNED INTERVENTIONS: Therapeutic exercises, Therapeutic activity, Neuromuscular re-education, Balance training, Gait training, Patient/Family education, Self Care, Joint mobilization, Dry Needling, Electrical stimulation, Cryotherapy, Moist heat, Vasopneumatic device, Manual therapy, and Re-evaluation.  PLAN FOR NEXT SESSION: assess HEP response, neutral spine core, proximal hip strength, lifting mechanics, continue TPDN if beneficial   Rosana Hoes, PT, DPT, LAT, ATC 08/24/23  8:46 AM Phone: [phone] Fax: [phone]    PHYSICAL THERAPY DISCHARGE SUMMARY  Visits from Start of Care: 8  Current functional level related to goals / functional outcomes: See above   Remaining deficits: See above   Education / Equipment: HEP   Patient agrees to discharge. Patient goals were not met. Patient is being discharged due to not returning since the last visit.  Rosana Hoes, PT, DPT, LAT, ATC 02/21/24  10:45 AM Phone:  [phone] Fax: [phone]

## 2023-09-06 ENCOUNTER — Telehealth: Payer: Self-pay | Admitting: Physical Therapy

## 2023-09-06 ENCOUNTER — Ambulatory Visit: Payer: 59 | Admitting: Physical Therapy

## 2023-09-06 NOTE — Telephone Encounter (Signed)
Attempted to contact patient regarding no show. Call could not be completed , unable to leave message.

## 2023-09-13 ENCOUNTER — Telehealth: Payer: Self-pay | Admitting: Physical Therapy

## 2023-09-13 ENCOUNTER — Ambulatory Visit: Payer: 59 | Admitting: Physical Therapy

## 2023-09-13 NOTE — Telephone Encounter (Signed)
Attempted to contact patient regarding his second no show. Unable to leave a voicemail.

## 2023-09-20 ENCOUNTER — Ambulatory Visit: Payer: 59

## 2024-12-04 ENCOUNTER — Encounter: Payer: Self-pay | Admitting: Gastroenterology

## 2024-12-25 ENCOUNTER — Other Ambulatory Visit: Payer: Self-pay | Admitting: Student

## 2024-12-25 DIAGNOSIS — F17219 Nicotine dependence, cigarettes, with unspecified nicotine-induced disorders: Secondary | ICD-10-CM

## 2025-01-03 ENCOUNTER — Inpatient Hospital Stay
Admission: RE | Admit: 2025-01-03 | Discharge: 2025-01-03 | Disposition: A | Source: Ambulatory Visit | Attending: Student

## 2025-01-03 DIAGNOSIS — F17219 Nicotine dependence, cigarettes, with unspecified nicotine-induced disorders: Secondary | ICD-10-CM

## 2025-01-08 ENCOUNTER — Ambulatory Visit: Admitting: Gastroenterology

## 2025-01-24 ENCOUNTER — Ambulatory Visit: Admitting: Physician Assistant

## 2025-02-01 ENCOUNTER — Ambulatory Visit: Admitting: Gastroenterology
